# Patient Record
Sex: Female | Born: 1935 | Race: Black or African American | Hispanic: No | Smoking: Former smoker
Health system: Southern US, Community
[De-identification: ages and names within clinical notes are randomized; demographics above are authoritative.]

## PROBLEM LIST (undated history)

## (undated) DIAGNOSIS — F419 Anxiety disorder, unspecified: Secondary | ICD-10-CM

## (undated) DIAGNOSIS — N343 Urethral syndrome, unspecified: Secondary | ICD-10-CM

## (undated) DIAGNOSIS — C679 Malignant neoplasm of bladder, unspecified: Secondary | ICD-10-CM

## (undated) DIAGNOSIS — E119 Type 2 diabetes mellitus without complications: Secondary | ICD-10-CM

## (undated) DIAGNOSIS — I10 Essential (primary) hypertension: Secondary | ICD-10-CM

## (undated) DIAGNOSIS — E039 Hypothyroidism, unspecified: Secondary | ICD-10-CM

## (undated) DIAGNOSIS — E669 Obesity, unspecified: Secondary | ICD-10-CM

## (undated) DIAGNOSIS — R519 Headache, unspecified: Secondary | ICD-10-CM

## (undated) DIAGNOSIS — E785 Hyperlipidemia, unspecified: Secondary | ICD-10-CM

## (undated) HISTORY — DX: Urethral syndrome, unspecified: N34.3

## (undated) HISTORY — PX: ABDOMINAL HYSTERECTOMY: SHX81

## (undated) HISTORY — DX: Malignant neoplasm of bladder, unspecified: C67.9

## (undated) HISTORY — PX: OTHER SURGICAL HISTORY: SHX169

## (undated) HISTORY — DX: Hypothyroidism, unspecified: E03.9

## (undated) HISTORY — DX: Anxiety disorder, unspecified: F41.9

## (undated) HISTORY — PX: DILATION AND CURETTAGE OF UTERUS: SHX78

## (undated) HISTORY — DX: Type 2 diabetes mellitus without complications: E11.9

## (undated) HISTORY — DX: Headache, unspecified: R51.9

## (undated) HISTORY — DX: Obesity, unspecified: E66.9

## (undated) HISTORY — DX: Hyperlipidemia, unspecified: E78.5

## (undated) HISTORY — PX: VESICOVAGINAL FISTULA CLOSURE W/ TAH: SUR271

## (undated) HISTORY — DX: Essential (primary) hypertension: I10

---

## 2004-02-28 ENCOUNTER — Ambulatory Visit: Payer: Self-pay | Admitting: Family Medicine

## 2004-06-11 ENCOUNTER — Ambulatory Visit: Payer: Self-pay | Admitting: Family Medicine

## 2004-07-24 ENCOUNTER — Ambulatory Visit: Payer: Self-pay | Admitting: Family Medicine

## 2004-11-18 ENCOUNTER — Ambulatory Visit: Payer: Self-pay | Admitting: Family Medicine

## 2005-03-20 ENCOUNTER — Ambulatory Visit: Payer: Self-pay | Admitting: Family Medicine

## 2005-07-13 ENCOUNTER — Ambulatory Visit: Payer: Self-pay | Admitting: Family Medicine

## 2005-09-07 ENCOUNTER — Ambulatory Visit: Payer: Self-pay | Admitting: Family Medicine

## 2005-10-15 ENCOUNTER — Ambulatory Visit: Payer: Self-pay | Admitting: Family Medicine

## 2005-11-12 ENCOUNTER — Ambulatory Visit: Payer: Self-pay | Admitting: Family Medicine

## 2005-11-17 ENCOUNTER — Ambulatory Visit: Payer: Self-pay | Admitting: Family Medicine

## 2005-12-23 ENCOUNTER — Ambulatory Visit: Payer: Self-pay | Admitting: Family Medicine

## 2006-03-15 ENCOUNTER — Ambulatory Visit: Payer: Self-pay | Admitting: Family Medicine

## 2006-04-29 ENCOUNTER — Ambulatory Visit: Payer: Self-pay | Admitting: Family Medicine

## 2006-07-02 LAB — HM COLONOSCOPY: HM Colonoscopy: NORMAL

## 2006-07-28 ENCOUNTER — Other Ambulatory Visit: Admission: RE | Admit: 2006-07-28 | Discharge: 2006-07-28 | Payer: Self-pay | Admitting: Family Medicine

## 2006-07-28 ENCOUNTER — Encounter: Payer: Self-pay | Admitting: Family Medicine

## 2006-07-28 ENCOUNTER — Ambulatory Visit: Payer: Self-pay | Admitting: Family Medicine

## 2006-07-28 LAB — CONVERTED CEMR LAB: Pap Smear: NORMAL

## 2006-07-30 ENCOUNTER — Encounter: Payer: Self-pay | Admitting: Family Medicine

## 2006-07-30 LAB — CONVERTED CEMR LAB: Microalb, Ur: 2.75 mg/dL — ABNORMAL HIGH (ref 0.00–1.89)

## 2006-08-05 LAB — HM MAMMOGRAPHY: HM Mammogram: NORMAL

## 2006-10-27 ENCOUNTER — Ambulatory Visit: Payer: Self-pay | Admitting: Family Medicine

## 2006-12-21 ENCOUNTER — Ambulatory Visit: Payer: Self-pay | Admitting: Family Medicine

## 2007-01-05 ENCOUNTER — Ambulatory Visit: Payer: Self-pay | Admitting: Family Medicine

## 2007-01-05 LAB — CONVERTED CEMR LAB
BUN: 22 mg/dL (ref 6–23)
CO2: 23 meq/L (ref 19–32)
Calcium: 8.8 mg/dL (ref 8.4–10.5)
Chloride: 107 meq/L (ref 96–112)
Creatinine, Ser: 1.51 mg/dL — ABNORMAL HIGH (ref 0.40–1.20)
Glucose, Bld: 93 mg/dL (ref 70–99)
Potassium: 4.1 meq/L (ref 3.5–5.3)
Sodium: 140 meq/L (ref 135–145)

## 2007-01-11 ENCOUNTER — Ambulatory Visit: Payer: Self-pay | Admitting: Family Medicine

## 2007-01-12 ENCOUNTER — Encounter: Payer: Self-pay | Admitting: Family Medicine

## 2007-01-12 LAB — CONVERTED CEMR LAB
Candida species: POSITIVE — AB
Chlamydia, DNA Probe: NEGATIVE
GC Probe Amp, Genital: NEGATIVE
Gardnerella vaginalis: POSITIVE — AB
Trichomonal Vaginitis: NEGATIVE

## 2007-04-20 ENCOUNTER — Ambulatory Visit: Payer: Self-pay | Admitting: Family Medicine

## 2007-04-20 LAB — CONVERTED CEMR LAB: Hgb A1c MFr Bld: 5.7 %

## 2007-05-06 ENCOUNTER — Encounter: Payer: Self-pay | Admitting: Family Medicine

## 2007-05-06 DIAGNOSIS — E785 Hyperlipidemia, unspecified: Secondary | ICD-10-CM | POA: Insufficient documentation

## 2007-05-06 DIAGNOSIS — I10 Essential (primary) hypertension: Secondary | ICD-10-CM | POA: Insufficient documentation

## 2007-05-06 DIAGNOSIS — E039 Hypothyroidism, unspecified: Secondary | ICD-10-CM | POA: Insufficient documentation

## 2007-05-06 DIAGNOSIS — E119 Type 2 diabetes mellitus without complications: Secondary | ICD-10-CM | POA: Insufficient documentation

## 2007-07-19 ENCOUNTER — Ambulatory Visit: Payer: Self-pay | Admitting: Family Medicine

## 2007-08-11 DIAGNOSIS — E669 Obesity, unspecified: Secondary | ICD-10-CM | POA: Insufficient documentation

## 2007-08-11 DIAGNOSIS — C679 Malignant neoplasm of bladder, unspecified: Secondary | ICD-10-CM | POA: Insufficient documentation

## 2007-08-12 ENCOUNTER — Encounter: Payer: Self-pay | Admitting: Family Medicine

## 2007-08-12 ENCOUNTER — Ambulatory Visit: Payer: Self-pay | Admitting: Family Medicine

## 2007-08-12 LAB — CONVERTED CEMR LAB: TSH: 5.714 microintl units/mL — ABNORMAL HIGH (ref 0.350–5.50)

## 2007-09-02 ENCOUNTER — Encounter: Payer: Self-pay | Admitting: Family Medicine

## 2007-09-02 LAB — CONVERTED CEMR LAB: Microalb, Ur: 1.03 mg/dL (ref 0.00–1.89)

## 2007-09-19 ENCOUNTER — Encounter: Payer: Self-pay | Admitting: Family Medicine

## 2007-09-19 ENCOUNTER — Ambulatory Visit: Payer: Self-pay | Admitting: Family Medicine

## 2007-09-19 LAB — CONVERTED CEMR LAB
Glucose, Bld: 99 mg/dL
OCCULT 1: NEGATIVE
TSH: 6.27 microintl units/mL — ABNORMAL HIGH (ref 0.350–5.50)

## 2007-12-06 ENCOUNTER — Ambulatory Visit: Payer: Self-pay | Admitting: Family Medicine

## 2007-12-06 LAB — CONVERTED CEMR LAB
Glucose, Bld: 131 mg/dL
Hgb A1c MFr Bld: 6.3 %

## 2008-01-19 ENCOUNTER — Telehealth: Payer: Self-pay | Admitting: Family Medicine

## 2008-01-23 ENCOUNTER — Ambulatory Visit: Payer: Self-pay | Admitting: Family Medicine

## 2008-01-23 DIAGNOSIS — N3000 Acute cystitis without hematuria: Secondary | ICD-10-CM | POA: Insufficient documentation

## 2008-01-23 LAB — CONVERTED CEMR LAB
Bilirubin Urine: NEGATIVE
Blood in Urine, dipstick: NEGATIVE
Glucose, Urine, Semiquant: NEGATIVE
Ketones, urine, test strip: NEGATIVE
Nitrite: POSITIVE
Protein, U semiquant: NEGATIVE
Specific Gravity, Urine: 1.025
Urobilinogen, UA: 0.2
WBC Urine, dipstick: NEGATIVE
pH: 5.5

## 2008-01-27 ENCOUNTER — Encounter: Payer: Self-pay | Admitting: Family Medicine

## 2008-02-15 ENCOUNTER — Encounter: Payer: Self-pay | Admitting: Family Medicine

## 2008-02-23 ENCOUNTER — Encounter: Payer: Self-pay | Admitting: Family Medicine

## 2008-02-27 ENCOUNTER — Encounter: Payer: Self-pay | Admitting: Family Medicine

## 2008-03-07 ENCOUNTER — Ambulatory Visit: Payer: Self-pay | Admitting: Family Medicine

## 2008-03-07 DIAGNOSIS — R5383 Other fatigue: Secondary | ICD-10-CM

## 2008-03-07 DIAGNOSIS — R5381 Other malaise: Secondary | ICD-10-CM | POA: Insufficient documentation

## 2008-03-07 DIAGNOSIS — M26609 Unspecified temporomandibular joint disorder, unspecified side: Secondary | ICD-10-CM | POA: Insufficient documentation

## 2008-03-07 LAB — CONVERTED CEMR LAB
Glucose, Bld: 129 mg/dL
Hgb A1c MFr Bld: 5.8 %

## 2008-03-09 ENCOUNTER — Encounter: Payer: Self-pay | Admitting: Family Medicine

## 2008-03-09 LAB — CONVERTED CEMR LAB
ALT: 13 units/L (ref 0–35)
AST: 15 units/L (ref 0–37)
Albumin: 3.9 g/dL (ref 3.5–5.2)
Alkaline Phosphatase: 36 units/L — ABNORMAL LOW (ref 39–117)
BUN: 27 mg/dL — ABNORMAL HIGH (ref 6–23)
Bilirubin, Direct: 0.1 mg/dL (ref 0.0–0.3)
CO2: 18 meq/L — ABNORMAL LOW (ref 19–32)
Calcium: 8.7 mg/dL (ref 8.4–10.5)
Chloride: 111 meq/L (ref 96–112)
Cholesterol: 149 mg/dL (ref 0–200)
Creatinine, Ser: 1.76 mg/dL — ABNORMAL HIGH (ref 0.40–1.20)
Glucose, Bld: 109 mg/dL — ABNORMAL HIGH (ref 70–99)
HCT: 34.4 % — ABNORMAL LOW (ref 36.0–46.0)
HDL: 48 mg/dL (ref >39)
Hemoglobin: 11.4 g/dL — ABNORMAL LOW (ref 12.0–15.0)
Indirect Bilirubin: 0.3 mg/dL (ref 0.0–0.9)
LDL Cholesterol: 76 mg/dL (ref 0–99)
MCHC: 33.1 g/dL (ref 30.0–36.0)
MCV: 92.7 fL (ref 78.0–100.0)
Platelets: 182 10*3/uL (ref 150–400)
Potassium: 4.5 meq/L (ref 3.5–5.3)
RBC: 3.71 M/uL — ABNORMAL LOW (ref 3.87–5.11)
RDW: 16 % — ABNORMAL HIGH (ref 11.5–15.5)
Retic Ct Pct: 1.7 % (ref 0.4–3.1)
Sodium: 142 meq/L (ref 135–145)
TSH: 3.14 microintl units/mL (ref 0.350–4.50)
Total Bilirubin: 0.4 mg/dL (ref 0.3–1.2)
Total CHOL/HDL Ratio: 3.1
Total Protein: 6.7 g/dL (ref 6.0–8.3)
Triglycerides: 126 mg/dL (ref <150)
VLDL: 25 mg/dL (ref 0–40)
WBC: 3.3 10*3/uL — ABNORMAL LOW (ref 4.0–10.5)

## 2008-03-12 ENCOUNTER — Telehealth: Payer: Self-pay | Admitting: Family Medicine

## 2008-04-18 ENCOUNTER — Ambulatory Visit: Payer: Self-pay | Admitting: Family Medicine

## 2008-04-18 ENCOUNTER — Telehealth (INDEPENDENT_AMBULATORY_CARE_PROVIDER_SITE_OTHER): Payer: Self-pay | Admitting: *Deleted

## 2008-04-18 LAB — CONVERTED CEMR LAB
Bilirubin Urine: NEGATIVE
Blood in Urine, dipstick: NEGATIVE
Glucose, Urine, Semiquant: NEGATIVE
Nitrite: POSITIVE
Protein, U semiquant: NEGATIVE
Specific Gravity, Urine: 1.025
Urobilinogen, UA: 0.2
pH: 5.5

## 2008-04-19 ENCOUNTER — Encounter: Payer: Self-pay | Admitting: Family Medicine

## 2008-04-23 ENCOUNTER — Encounter: Payer: Self-pay | Admitting: Family Medicine

## 2008-05-08 ENCOUNTER — Telehealth: Payer: Self-pay | Admitting: Family Medicine

## 2008-05-10 ENCOUNTER — Ambulatory Visit: Payer: Self-pay | Admitting: Family Medicine

## 2008-05-10 LAB — CONVERTED CEMR LAB
Bilirubin Urine: NEGATIVE
Blood in Urine, dipstick: NEGATIVE
Glucose, Urine, Semiquant: NEGATIVE
Nitrite: POSITIVE
Protein, U semiquant: NEGATIVE
Specific Gravity, Urine: 1.02
Urobilinogen, UA: 0.2
WBC Urine, dipstick: NEGATIVE
pH: 5.5

## 2008-05-11 ENCOUNTER — Encounter: Payer: Self-pay | Admitting: Family Medicine

## 2008-09-07 ENCOUNTER — Ambulatory Visit: Payer: Self-pay | Admitting: Family Medicine

## 2008-09-07 LAB — CONVERTED CEMR LAB
Creatinine, Urine: 36.5 mg/dL
Glucose, Bld: 155 mg/dL
Microalb Creat Ratio: 13.7 mg/g (ref 0.0–30.0)
Microalb, Ur: 0.5 mg/dL (ref 0.00–1.89)

## 2008-09-08 DIAGNOSIS — F339 Major depressive disorder, recurrent, unspecified: Secondary | ICD-10-CM | POA: Insufficient documentation

## 2008-09-08 DIAGNOSIS — F329 Major depressive disorder, single episode, unspecified: Secondary | ICD-10-CM

## 2008-09-10 ENCOUNTER — Encounter: Payer: Self-pay | Admitting: Family Medicine

## 2008-09-11 ENCOUNTER — Encounter: Payer: Self-pay | Admitting: Family Medicine

## 2008-11-23 ENCOUNTER — Ambulatory Visit: Payer: Self-pay | Admitting: Family Medicine

## 2008-11-23 LAB — CONVERTED CEMR LAB
ALT: 16 units/L (ref 0–35)
AST: 18 units/L (ref 0–37)
Albumin: 4 g/dL (ref 3.5–5.2)
Alkaline Phosphatase: 53 units/L (ref 39–117)
BUN: 20 mg/dL (ref 6–23)
Basophils Absolute: 0 10*3/uL (ref 0.0–0.1)
Basophils Relative: 0 % (ref 0–1)
Bilirubin, Direct: 0.1 mg/dL (ref 0.0–0.3)
CO2: 19 meq/L (ref 19–32)
Calcium: 9.2 mg/dL (ref 8.4–10.5)
Chloride: 108 meq/L (ref 96–112)
Cholesterol: 114 mg/dL (ref 0–200)
Creatinine, Ser: 1.24 mg/dL — ABNORMAL HIGH (ref 0.40–1.20)
Eosinophils Absolute: 0.1 10*3/uL (ref 0.0–0.7)
Eosinophils Relative: 2 % (ref 0–5)
Glucose, Bld: 156 mg/dL
Glucose, Bld: 125 mg/dL — ABNORMAL HIGH (ref 70–99)
HCT: 39 % (ref 36.0–46.0)
HDL: 42 mg/dL (ref >39)
Hemoglobin: 12.9 g/dL (ref 12.0–15.0)
Hgb A1c MFr Bld: 6.5 %
Indirect Bilirubin: 0.3 mg/dL (ref 0.0–0.9)
LDL Cholesterol: 54 mg/dL (ref 0–99)
Lymphocytes Relative: 26 % (ref 12–46)
Lymphs Abs: 1 10*3/uL (ref 0.7–4.0)
MCHC: 33.1 g/dL (ref 30.0–36.0)
MCV: 92.9 fL (ref 78.0–100.0)
Monocytes Absolute: 0.3 10*3/uL (ref 0.1–1.0)
Monocytes Relative: 9 % (ref 3–12)
Neutro Abs: 2.4 10*3/uL (ref 1.7–7.7)
Neutrophils Relative %: 63 % (ref 43–77)
Platelets: 181 10*3/uL (ref 150–400)
Potassium: 4.4 meq/L (ref 3.5–5.3)
RBC: 4.2 M/uL (ref 3.87–5.11)
RDW: 15.2 % (ref 11.5–15.5)
Sodium: 141 meq/L (ref 135–145)
TSH: 4.409 microintl units/mL (ref 0.350–4.500)
Total Bilirubin: 0.4 mg/dL (ref 0.3–1.2)
Total CHOL/HDL Ratio: 2.7
Total Protein: 6.9 g/dL (ref 6.0–8.3)
Triglycerides: 89 mg/dL (ref <150)
VLDL: 18 mg/dL (ref 0–40)
WBC: 3.8 10*3/uL — ABNORMAL LOW (ref 4.0–10.5)

## 2008-12-03 ENCOUNTER — Encounter: Payer: Self-pay | Admitting: Family Medicine

## 2008-12-05 ENCOUNTER — Encounter: Payer: Self-pay | Admitting: Family Medicine

## 2008-12-10 ENCOUNTER — Telehealth: Payer: Self-pay | Admitting: Family Medicine

## 2008-12-27 ENCOUNTER — Encounter: Payer: Self-pay | Admitting: Family Medicine

## 2009-01-22 ENCOUNTER — Telehealth: Payer: Self-pay | Admitting: Family Medicine

## 2009-02-07 ENCOUNTER — Telehealth: Payer: Self-pay | Admitting: Family Medicine

## 2009-05-22 ENCOUNTER — Encounter: Payer: Self-pay | Admitting: Family Medicine

## 2009-06-19 ENCOUNTER — Encounter: Payer: Self-pay | Admitting: Family Medicine

## 2009-06-26 ENCOUNTER — Ambulatory Visit: Payer: Self-pay | Admitting: Family Medicine

## 2009-06-26 LAB — CONVERTED CEMR LAB
Bilirubin Urine: NEGATIVE
Blood Glucose, Fasting: 173 mg/dL
Blood in Urine, dipstick: NEGATIVE
Glucose, Urine, Semiquant: NEGATIVE
Ketones, urine, test strip: NEGATIVE
Nitrite: NEGATIVE
Protein, U semiquant: NEGATIVE
Specific Gravity, Urine: 1.025
Urobilinogen, UA: 0.2
WBC Urine, dipstick: NEGATIVE
pH: 5

## 2009-07-02 ENCOUNTER — Encounter: Payer: Self-pay | Admitting: Family Medicine

## 2009-07-03 LAB — CONVERTED CEMR LAB
ALT: 22 units/L (ref 0–35)
AST: 20 units/L (ref 0–37)
Albumin: 3.8 g/dL (ref 3.5–5.2)
Alkaline Phosphatase: 54 units/L (ref 39–117)
BUN: 19 mg/dL (ref 6–23)
Bilirubin, Direct: 0.1 mg/dL (ref 0.0–0.3)
CO2: 23 meq/L (ref 19–32)
Calcium: 9 mg/dL (ref 8.4–10.5)
Chloride: 109 meq/L (ref 96–112)
Cholesterol: 121 mg/dL (ref 0–200)
Creatinine, Ser: 1.28 mg/dL — ABNORMAL HIGH (ref 0.40–1.20)
Glucose, Bld: 136 mg/dL — ABNORMAL HIGH (ref 70–99)
HDL: 50 mg/dL (ref >39)
Hgb A1c MFr Bld: 6.8 % — ABNORMAL HIGH (ref 4.6–6.1)
Indirect Bilirubin: 0.2 mg/dL (ref 0.0–0.9)
LDL Cholesterol: 55 mg/dL (ref 0–99)
Potassium: 4.3 meq/L (ref 3.5–5.3)
Sodium: 142 meq/L (ref 135–145)
TSH: 3.799 microintl units/mL (ref 0.350–4.500)
Total Bilirubin: 0.3 mg/dL (ref 0.3–1.2)
Total CHOL/HDL Ratio: 2.4
Total Protein: 6.5 g/dL (ref 6.0–8.3)
Triglycerides: 82 mg/dL (ref <150)
VLDL: 16 mg/dL (ref 0–40)
Vit D, 25-Hydroxy: 29 ng/mL — ABNORMAL LOW (ref 30–89)

## 2009-07-04 ENCOUNTER — Telehealth: Payer: Self-pay | Admitting: Family Medicine

## 2009-07-05 ENCOUNTER — Encounter: Payer: Self-pay | Admitting: Family Medicine

## 2009-07-08 ENCOUNTER — Encounter: Payer: Self-pay | Admitting: Family Medicine

## 2009-07-13 LAB — CONVERTED CEMR LAB: Retic Ct Pct: 1.1 % (ref 0.4–3.1)

## 2009-10-02 ENCOUNTER — Ambulatory Visit: Payer: Self-pay | Admitting: Family Medicine

## 2009-10-02 LAB — HM DIABETES FOOT EXAM

## 2009-10-02 LAB — CONVERTED CEMR LAB: Hgb A1c MFr Bld: 6.5 % — ABNORMAL HIGH (ref <5.7)

## 2009-10-03 ENCOUNTER — Encounter: Payer: Self-pay | Admitting: Family Medicine

## 2009-10-03 LAB — CONVERTED CEMR LAB
Creatinine, Urine: 171 mg/dL
Microalb Creat Ratio: 2.9 mg/g (ref 0.0–30.0)
Microalb, Ur: 0.5 mg/dL (ref 0.00–1.89)

## 2010-02-20 ENCOUNTER — Telehealth: Payer: Self-pay | Admitting: Family Medicine

## 2010-02-21 ENCOUNTER — Ambulatory Visit: Payer: Self-pay | Admitting: Family Medicine

## 2010-02-21 DIAGNOSIS — M79609 Pain in unspecified limb: Secondary | ICD-10-CM | POA: Insufficient documentation

## 2010-02-26 LAB — CONVERTED CEMR LAB
ALT: 16 units/L (ref 0–35)
AST: 18 units/L (ref 0–37)
Albumin: 4.1 g/dL (ref 3.5–5.2)
Alkaline Phosphatase: 50 units/L (ref 39–117)
Basophils Absolute: 0 10*3/uL (ref 0.0–0.1)
Basophils Relative: 1 % (ref 0–1)
Bilirubin, Direct: 0.1 mg/dL (ref 0.0–0.3)
Cholesterol: 121 mg/dL (ref 0–200)
Eosinophils Absolute: 0.1 10*3/uL (ref 0.0–0.7)
Eosinophils Relative: 2 % (ref 0–5)
HCT: 38.2 % (ref 36.0–46.0)
HDL: 44 mg/dL (ref >39)
Hemoglobin: 12.9 g/dL (ref 12.0–15.0)
Hgb A1c MFr Bld: 6.6 % — ABNORMAL HIGH (ref <5.7)
Indirect Bilirubin: 0.4 mg/dL (ref 0.0–0.9)
LDL Cholesterol: 62 mg/dL (ref 0–99)
Lymphocytes Relative: 26 % (ref 12–46)
Lymphs Abs: 1.1 10*3/uL (ref 0.7–4.0)
MCHC: 33.8 g/dL (ref 30.0–36.0)
MCV: 90.7 fL (ref 78.0–100.0)
Monocytes Absolute: 0.5 10*3/uL (ref 0.1–1.0)
Monocytes Relative: 13 % — ABNORMAL HIGH (ref 3–12)
Neutro Abs: 2.4 10*3/uL (ref 1.7–7.7)
Neutrophils Relative %: 59 % (ref 43–77)
Platelets: 182 10*3/uL (ref 150–400)
RBC: 4.21 M/uL (ref 3.87–5.11)
RDW: 15.3 % (ref 11.5–15.5)
TSH: 4.777 microintl units/mL — ABNORMAL HIGH (ref 0.350–4.500)
Total Bilirubin: 0.5 mg/dL (ref 0.3–1.2)
Total CHOL/HDL Ratio: 2.8
Total Protein: 7 g/dL (ref 6.0–8.3)
Triglycerides: 75 mg/dL (ref <150)
VLDL: 15 mg/dL (ref 0–40)
WBC: 4.1 10*3/uL (ref 4.0–10.5)

## 2010-03-11 ENCOUNTER — Telehealth (INDEPENDENT_AMBULATORY_CARE_PROVIDER_SITE_OTHER): Payer: Self-pay | Admitting: *Deleted

## 2010-05-07 ENCOUNTER — Ambulatory Visit
Admission: RE | Admit: 2010-05-07 | Discharge: 2010-05-07 | Payer: Self-pay | Source: Home / Self Care | Attending: Orthopedic Surgery | Admitting: Orthopedic Surgery

## 2010-05-07 ENCOUNTER — Encounter: Payer: Self-pay | Admitting: Orthopedic Surgery

## 2010-05-07 DIAGNOSIS — M5430 Sciatica, unspecified side: Secondary | ICD-10-CM | POA: Insufficient documentation

## 2010-05-11 ENCOUNTER — Encounter: Payer: Self-pay | Admitting: Family Medicine

## 2010-05-12 ENCOUNTER — Encounter: Payer: Self-pay | Admitting: Family Medicine

## 2010-05-20 NOTE — Assessment & Plan Note (Signed)
Summary: office visit   Vital Signs:  Patient Profile:   77 Years Old Female Height:     65.5 inches Weight:      220.3 pounds BMI:     36.23 Pulse rate:   100 / minute Resp:     16 per minute BP sitting:   120 / 70  (left arm)  Pt. in pain?   no  Vitals Entered By: Calvert Cantor (December 06, 2007 8:28 AM)                  Chief Complaint:  pt here for check up.  History of Present Illness: No complaints. ne o recent fever or chills.  Linda Richardson continues to live independently and is well able to care for her self. She does try to be careful in her diet and keeps active.  She denies polyuria, poyldypsia or hypoglycemic episodes. Her fasting blood sugars are seldom over 110 when checked. She denies depression, anxiety or insomnia.    Prior Medications Reviewed Using: Medication Bottles  Updated Prior Medication List: METOPROLOL SUCCINATE 50 MG  TB24 (METOPROLOL SUCCINATE) Take 1/2 tablet by mouth two times a day AVALIDE 150-12.5 MG  TABS (IRBESARTAN-HYDROCHLOROTHIAZIDE) Take 1 tablet by mouth two times a day SIMVASTATIN 80 MG  TABS (SIMVASTATIN) Take 1 tab by mouth at bedtime CLONIDINE HCL 0.2 MG  TABS (CLONIDINE HCL) Take 1 tablet by mouth at bedtime IBUPROFEN 800 MG  TABS (IBUPROFEN) Take 1 tablet by mouth three times a day ALPHAGAN P 0.1 %  SOLN (BRIMONIDINE TARTRATE) One drop each eye twice daily as needed AVANDIA 4 MG  TABS (ROSIGLITAZONE MALEATE) Take one tab by mouth once daily FEXOFENADINE HCL 180 MG  TABS (FEXOFENADINE HCL) Take 1 tablet by mouth once a day CITALOPRAM HYDROBROMIDE 20 MG  TABS (CITALOPRAM HYDROBROMIDE) Take 1 tablet by mouth once a day CLOTRIMAZOLE-BETAMETHASONE 1-0.05 %  CREA (CLOTRIMAZOLE-BETAMETHASONE) apply once daily as needed CETIRIZINE HCL 10 MG  TABS (CETIRIZINE HCL) one tab by mouth once daily SYNTHROID 25 MCG  TABS (LEVOTHYROXINE SODIUM) 1.5 tab once daily [BMN] * DIABETIC SHOES WITH INSERTS X 1PR.   Current Allergies: ! PCN !  SULFA  Past Surgical History:    Hysterectomy    Surgery on bladder for cancer    D and C x 3   Family History:        MOTHER DECEASED STROKE    FATHER DECEASED KILLED    ONE SISTER LIVING HEALTH UNKNOWN  Social History:    WIDOW    Former Smoker    Alcohol use-no    Drug use-no    three adult children    Review of Systems  ENT      Denies ear discharge, sinus pressure, and sore throat.  CV      Denies chest pain or discomfort, palpitations, and swelling of feet.  Resp      Denies cough, shortness of breath, sputum productive, and wheezing.  GI      Denies abdominal pain, constipation, diarrhea, nausea, and vomiting.  GU      Denies dysuria, incontinence, and urinary frequency.  Linda      Denies joint pain, joint swelling, and stiffness.  Neuro      Complains of headaches, poor balance, and visual disturbances.  Psych      Denies anxiety, depression, suicidal thoughts/plans, and thoughts of violence.  Endo      Denies excessive thirst and polyuria.  Allergy  Complains of seasonal allergies.      Denies hives or rash, itching eyes, and sneezing.   Physical Exam  General:     overweight-appearing.   Head:     Normocephalic and atraumatic without obvious abnormalities. No apparent alopecia or balding. Eyes:     vision grossly intact and pupils equal.   Ears:     External ear exam shows no significant lesions or deformities.  Otoscopic examination reveals clear canals, tympanic membranes are intact bilaterally without bulging, retraction, inflammation or discharge. Hearing is grossly normal bilaterally. Nose:     no external deformity, no external erythema, and no nasal discharge.   Mouth:     pharynx pink and moist and fair dentition.   Neck:     No deformities, masses, or tenderness noted. Lungs:     Normal respiratory effort, chest expands symmetrically. Lungs are clear to auscultation, no crackles or wheezes. Heart:     Normal rate and  regular rhythm. S1 and S2 normal without gallop, murmur, click, rub or other extra sounds. Abdomen:     soft, non-tender, and normal bowel sounds.   Extremities:     No clubbing, cyanosis, edema, or deformity noted with normal full range of motion of all joints.   Neurologic:     alert & oriented X3, cranial nerves II-XII intact, strength normal in all extremities, and sensation intact to light touch.   Skin:     Intact without suspicious lesions or rashes Psych:     good eye contact, not anxious appearing, and not depressed appearing.      Impression & Recommendations:  Problem # 1:  BLADDER CANCER (ICD-188.9) Assessment: Comment Only Cured. Pt. followed by urology.  Problem # 2:  OBESITY (ICD-278.00) Assessment: Unchanged  Problem # 3:  ANXIETY (ICD-300.00) Assessment: Improved  Her updated medication list for this problem includes:    Citalopram Hydrobromide 20 Mg Tabs (Citalopram hydrobromide) .Marland Kitchen... Take 1 tablet by mouth once a day   Problem # 4:  HYPOTHYROIDISM (ICD-244.9) Assessment: Comment Only  Her updated medication list for this problem includes:    Synthroid 25 Mcg Tabs (Levothyroxine sodium) .Marland Kitchen... 1.5 tab once daily  Orders: T-TSH ([id number]) Awaiting current TSH result for med adjustment  Labs Reviewed: TSH: 6.270 (09/19/2007)    HgBA1c: 6.3 (12/06/2007) Chol: 127 (08/12/2007)   HDL: 41 (08/12/2007)   LDL: 53 (08/12/2007)   TG: 163 (08/12/2007)   Problem # 5:  DIABETES MELLITUS, TYPE II (ICD-250.00) Assessment: Unchanged  Her updated medication list for this problem includes:    Avalide 150-12.5 Mg Tabs (Irbesartan-hydrochlorothiazide) .Marland Kitchen... Take 1 tablet by mouth two times a day    Avandia 4 Mg Tabs (Rosiglitazone maleate) .Marland Kitchen... Take one tab by mouth once daily  Labs Reviewed: HgBA1c: 6.3 (12/06/2007)   Creat: 1.36 (08/12/2007)   Microalbumin: 1.03 (09/02/2007)   Problem # 6:  HYPERTENSION (ICD-401.9) Assessment: Unchanged  Her updated  medication list for this problem includes:    Metoprolol Succinate 50 Mg Tb24 (Metoprolol succinate) .Marland Kitchen... Take 1/2 tablet by mouth two times a day    Avalide 150-12.5 Mg Tabs (Irbesartan-hydrochlorothiazide) .Marland Kitchen... Take 1 tablet by mouth two times a day    Clonidine Hcl 0.2 Mg Tabs (Clonidine hcl) .Marland Kitchen... Take 1 tablet by mouth at bedtime  Orders: T-Basic Metabolic Panel [id number])   Complete Medication List: 1)  Metoprolol Succinate 50 Mg Tb24 (Metoprolol succinate) .... Take 1/2 tablet by mouth two times a day 2)  Avalide 150-12.5 Mg Tabs (Irbesartan-hydrochlorothiazide) .Marland KitchenMarland KitchenMarland Kitchen  Take 1 tablet by mouth two times a day 3)  Simvastatin 80 Mg Tabs (Simvastatin) .... Take 1 tab by mouth at bedtime 4)  Clonidine Hcl 0.2 Mg Tabs (Clonidine hcl) .... Take 1 tablet by mouth at bedtime 5)  Ibuprofen 800 Mg Tabs (Ibuprofen) .... Take 1 tablet by mouth three times a day 6)  Alphagan P 0.1 % Soln (Brimonidine tartrate) .... One drop each eye twice daily as needed 7)  Avandia 4 Mg Tabs (Rosiglitazone maleate) .... Take one tab by mouth once daily 8)  Fexofenadine Hcl 180 Mg Tabs (Fexofenadine hcl) .... Take 1 tablet by mouth once a day 9)  Citalopram Hydrobromide 20 Mg Tabs (Citalopram hydrobromide) .... Take 1 tablet by mouth once a day 10)  Clotrimazole-betamethasone 1-0.05 % Crea (Clotrimazole-betamethasone) .... Apply once daily as needed 11)  Cetirizine Hcl 10 Mg Tabs (Cetirizine hcl) .... One tab by mouth once daily 12)  Synthroid 25 Mcg Tabs (Levothyroxine sodium) .... 1.5 tab once daily 13)  Diabetic Shoes With Inserts X 1pr.  14)  Onetouch Lancets Misc (Lancets) .... Once daily testing 15)  Onetouch Test Strp (Glucose blood) .... Once daily testing  Other Orders: T-Hepatic Function [id number]) T-Lipid Profile [id number])   Patient Instructions: 1)  Please schedule a follow-up appointment in 3 months. 2)  It is important that you exercise regularly at least 20 minutes 5 times a week.  If you develop chest pain, have severe difficulty breathing, or feel very tired , stop exercising immediately and seek medical attention. 3)  You need to lose weight. Consider a lower calorie diet and regular exercise.  4)  Check your Blood Pressure regularly. If it is above150/95  you should make an appointment. 5)  Check your blood sugars regularly. If your readings are usually above250: or below 70 you should contact our office. 6)  BMP prior to visit, ICD-9: 7)  Hepatic Panel prior to visit, ICD-9: 8)  Lipid Panel prior to visit, ICD-9:   Prescriptions: ONETOUCH TEST   STRP (GLUCOSE BLOOD) once daily testing  #3 mnth x 3   Entered by:   Worthy Keeler LPN   Authorized by:   Syliva Overman MD   Signed by:   Worthy Keeler LPN on 29/56/2130   Method used:   Handwritten   RxID:   [id number] ONETOUCH LANCETS   MISC (LANCETS) once daily testing  #3 months x 3   Entered by:   Worthy Keeler LPN   Authorized by:   Syliva Overman MD   Signed by:   Worthy Keeler LPN on 32/44/0102   Method used:   Handwritten   RxID:   [id number]  ] Laboratory Results   Blood Tests   Date/Time Received: December 06, 2007 8:36 AM  Date/Time Reported: December 06, 2007 8:36 AM   Glucose (random): 131 mg/dL   (Normal Range: 56-387) HGBA1C: 6.3%   (Normal Range: Non-Diabetic - 3-6%   Control Diabetic - 6-8%)

## 2010-05-20 NOTE — Assessment & Plan Note (Signed)
Summary: office visit   Vital Signs:  Patient profile:   77 year old female Menstrual status:  hysterectomy Height:      65.5 inches Weight:      217 pounds BMI:     35.69 O2 Sat:      93 % Pulse rate:   71 / minute Pulse rhythm:   regular Resp:     16 per minute BP sitting:   120 / 72  (left arm) Cuff size:   large  Vitals Entered By: Everitt Amber (November 23, 2008 8:47 AM)  Nutrition Counseling: Patient's BMI is greater than 25 and therefore counseled on weight management options. CC: Follow up chronic problems Is Patient Diabetic? Yes   CC:  Follow up chronic problems.  History of Present Illness: Patient reports doing well.  Denies any recent fever or chills.  Denies any appetite change or change in bowel movements. Patient denies depression, anxiety or insomnia. She denies any head or chest congestion, dysuria or frequency. She does have mild joint pain and stiffness, but has no h/o falling. She reports regular bowel movements.  Linda Richardson states that she rides her bike for 1 hour daily  Allergies: 1)  ! Pcn 2)  ! Sulfa  Review of Systems      See HPI General:  Denies chills and fever. Eyes:  Denies blurring and double vision. ENT:  Denies earache, hoarseness, nasal congestion, sinus pressure, and sore throat. CV:  Denies chest pain or discomfort, palpitations, and swelling of feet. Resp:  Denies cough and sputum productive. GI:  Denies abdominal pain, bloody stools, constipation, diarrhea, nausea, and vomiting. GU:  See HPI; Denies incontinence. MS:  Complains of joint pain and stiffness; denies loss of strength, low back pain, and mid back pain; not disabling in any way. Derm:  Denies itching, lesion(s), and rash. Neuro:  Denies falling down, headaches, and memory loss. Psych:  Complains of anxiety and depression; denies easily angered, easily tearful, irritability, suicidal thoughts/plans, thoughts of violence, and unusual visions or sounds; improved on  medication and with time, she is leaving family problems in God's hands. Endo:  Denies cold intolerance, excessive hunger, excessive thirst, excessive urination, heat intolerance, polyuria, and weight change; tests on avg once weekly. Heme:  Denies abnormal bruising and bleeding. Allergy:  Denies hives or rash and itching eyes.  Physical Exam  General:  alert, well-hydrated, and overweight-appearing. HEENT: No facial asymmetry,  EOMI, No sinus tenderness, TM's Clear, oropharynx  pink and moist.   Chest: Clear to auscultation bilaterally.  CVS: S1, S2, No murmurs, No S3.   Abd: Soft, Nontender.  MS: Adequate ROM spine, hips, shoulders and knees.  Ext: No edema.   CNS: CN 2-12 intact, power tone and sensation normal throughout.   Skin: Intact, no visible lesions or rashes.  Psych: Good eye contact, normal affect.  Memory intact, not anxious or depressed appearing.     Diabetes Management Exam:    Foot Exam (with socks and/or shoes not present):       Sensory-Monofilament:          Left foot: normal          Right foot: normal       Inspection:          Left foot: normal          Right foot: normal       Nails:          Left foot: thickened  Right foot: thickened   Impression & Recommendations:  Problem # 1:  DEPRESSION (ICD-311) Assessment Improved  Her updated medication list for this problem includes:    Fluoxetine Hcl 10 Mg Tabs (Fluoxetine hcl) .Marland Kitchen... Take 1 tablet by mouth once a day  Problem # 2:  OBESITY (ICD-278.00) Assessment: Unchanged  Ht: 65.5 (11/23/2008)   Wt: 217 (11/23/2008)   BMI: 35.69 (11/23/2008)  Problem # 3:  HYPERTENSION (ICD-401.9) Assessment: Unchanged  Her updated medication list for this problem includes:    Metoprolol Succinate 50 Mg Tb24 (Metoprolol succinate) .Marland Kitchen... Take 1/2 tablet by mouth two times a day    Avalide 150-12.5 Mg Tabs (Irbesartan-hydrochlorothiazide) .Marland Kitchen... Take 1 tablet by mouth two times a day    Clonidine Hcl 0.2  Mg Tabs (Clonidine hcl) .Marland Kitchen... Take 1 tablet by mouth at bedtime  Orders: T-Basic Metabolic Panel [id number])  BP today: 120/72 Prior BP: 120/62 (09/07/2008)  Labs Reviewed: K+: 4.5 (03/07/2008) Creat: : 1.76 (03/07/2008)   Chol: 149 (03/07/2008)   HDL: 48 (03/07/2008)   LDL: 76 (03/07/2008)   TG: 126 (03/07/2008)  Problem # 4:  HYPERLIPIDEMIA (ICD-272.4) Assessment: Comment Only  Her updated medication list for this problem includes:    Simvastatin 80 Mg Tabs (Simvastatin) .Marland Kitchen... Take 1 tab by mouth at bedtime  Orders: T-Lipid Profile [id number]) T-Hepatic Function [id number])  Labs Reviewed: SGOT: 15 (03/07/2008)   SGPT: 13 (03/07/2008)   HDL:48 (03/07/2008), 41 (08/12/2007)  LDL:76 (03/07/2008), 53 (08/12/2007)  Chol:149 (03/07/2008), 127 (08/12/2007)  Trig:126 (03/07/2008), 163 (08/12/2007)  Problem # 5:  DIABETES MELLITUS, TYPE II (ICD-250.00) Assessment: Deteriorated  Her updated medication list for this problem includes:    Avalide 150-12.5 Mg Tabs (Irbesartan-hydrochlorothiazide) .Marland Kitchen... Take 1 tablet by mouth two times a day    Avandia 4 Mg Tabs (Rosiglitazone maleate) .Marland Kitchen... Take one tab by mouth once daily  Orders: Glucose, (CBG) ([id number]) Hemoglobin A1C (83036)  Labs Reviewed: Creat: 1.76 (03/07/2008)    Reviewed HgBA1c results: 6.5 (11/23/2008)  6.3 (09/10/2008)  Complete Medication List: 1)  Metoprolol Succinate 50 Mg Tb24 (Metoprolol succinate) .... Take 1/2 tablet by mouth two times a day 2)  Avalide 150-12.5 Mg Tabs (Irbesartan-hydrochlorothiazide) .... Take 1 tablet by mouth two times a day 3)  Simvastatin 80 Mg Tabs (Simvastatin) .... Take 1 tab by mouth at bedtime 4)  Clonidine Hcl 0.2 Mg Tabs (Clonidine hcl) .... Take 1 tablet by mouth at bedtime 5)  Alphagan P 0.1 % Soln (Brimonidine tartrate) .... One drop each eye twice daily as needed 6)  Avandia 4 Mg Tabs (Rosiglitazone maleate) .... Take one tab by mouth once daily 7)   Clotrimazole-betamethasone 1-0.05 % Crea (Clotrimazole-betamethasone) .... Apply once daily as needed 8)  Synthroid 25 Mcg Tabs (Levothyroxine sodium) .... 1.5 tab once daily 9)  Onetouch Lancets Misc (Lancets) .... Once daily testing 10)  Onetouch Test Strp (Glucose blood) .... Once daily testing 11)  Phenazopyridine Hcl 100 Mg Tabs (Phenazopyridine hcl) .... Take one tab every 6 hours prn 12)  Fluoxetine Hcl 10 Mg Tabs (Fluoxetine hcl) .... Take 1 tablet by mouth once a day  Other Orders: Radiology Referral (Radiology) Radiology Referral (Radiology) T-CBC w/Diff [id number]) T-TSH [id number])  Patient Instructions: 1)  Please schedule a CPE in 3 months. 2)  It is important that you exercise regularly at least 60 minutes 6 times a week. If you develop chest pain, have severe difficulty breathing, or feel very tired , stop exercising immediately and seek medical attention. 3)  You need  to lose weight. Consider a lower calorie diet and regular exercise.  4)  BMP prior to visit, ICD-9: 5)  Hepatic Panel prior to visit, ICD-9:  fasting labs  today 6)  Lipid Panel prior to visit, ICD-9: 7)  TSH prior to visit, ICD-9: 8)  CBC w/ Diff prior to visit, ICD-9: 9)  We will  reschedule your mamogram today.  Laboratory Results   Blood Tests   Date/Time Received: November 23, 2008  Date/Time Reported: November 23, 2008   Glucose (random): 156 mg/dL   (Normal Range: 02-725) HGBA1C: 6.5%   (Normal Range: Non-Diabetic - 3-6%   Control Diabetic - 6-8%)

## 2010-05-20 NOTE — Progress Notes (Signed)
Summary: [facility]  [facility]   Imported By: Lind Guest 05/24/2009 09:09:38  _____________________________________________________________________  External Attachment:    Type:   Image     Comment:   External Document

## 2010-05-20 NOTE — Progress Notes (Signed)
Summary: hip and arms in pain  Phone Note Call from Patient   Summary of Call: is having pain in hip and arms  where she has arth. her arms and hip is hurting  has done tried advil and tyl. and mot working needs something for pain  call back at [phone] [facility] in [place] Initial call taken by: Lind Guest,  February 07, 2009 2:21 PM  Follow-up for Phone Call        advise and erx tramadol 50 mg Take 1 tablet by mouth two times a day as needed #30 only tell her i hope this helps, youncankl also offer toradol 60mg  and depomedrol 80 mg iM tomorrow nurse visit only Follow-up by: Syliva Overman MD,  February 07, 2009 4:37 PM  Additional Follow-up for Phone Call Additional follow up Details #1::        declined shots, states she will try meds Additional Follow-up by: Worthy Keeler LPN,  February 07, 2009 4:58 PM    New/Updated Medications: TRAMADOL HCL 50 MG TABS (TRAMADOL HCL) one tab by mouth bid Prescriptions: TRAMADOL HCL 50 MG TABS (TRAMADOL HCL) one tab by mouth bid  #30 x 0   Entered by:   Worthy Keeler LPN   Authorized by:   Syliva Overman MD   Signed by:   Worthy Keeler LPN on 65/78/4696   Method used:   Electronically to        [facility] # [phone]* (retail)       [address]       [place], [place]  [address]       Ph: [phone] or [phone]       Fax: [phone]   RxID:   [id number]

## 2010-05-20 NOTE — Progress Notes (Signed)
Summary: FFET AND JAW  Phone Note Call from Patient   Summary of Call: WENT TO ER YESTERDAY  ABOUT HER JAW AND MEANT TO TALK TO YOU ABOUT HER FEET WHEN HERE  THEY SWELL EVERY MORNING AFTER WALKING AROUND   Initial call taken by: Lind Guest,  March 12, 2008 11:10 AM  Follow-up for Phone Call        Returned call, no answer Follow-up by: Everitt Amber,  March 12, 2008 11:38 AM  Additional Follow-up for Phone Call Additional follow up Details #1::        Her feet have been swelling. When she wakes up, they are not swollen, but after walking around some her ankles get swollen. 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 Er gave her Naproxen 500mg . It still hurts some and looks swollen. She just wanted you to know.  Additional Follow-up by: Everitt Amber,  March 13, 2008 1:00 PM    Additional Follow-up for Phone Call Additional follow up Details #2::    advise since swell after walking, elevate legs more and wear support hose, the treatment from the ED is similar to what I gave her and the diagnosis is the same, give the med time if no better like I advised before , she can see ENT, I will refer her, burt she needs to give her med TIME Follow-up by: Syliva Overman MD,  March 13, 2008 4:23 PM  Additional Follow-up for Phone Call Additional follow up Details #3:: Details for Additional Follow-up Action Taken: patient aware Additional Follow-up by: Worthy Keeler LPN,  March 14, 2008 1:45 PM

## 2010-05-20 NOTE — Medication Information (Signed)
Summary: [employer] [facility]   Imported By: Lind Guest 02/23/2008 13:50:31  _____________________________________________________________________  External Attachment:    Type:   Image     Comment:   External Document

## 2010-05-20 NOTE — Progress Notes (Signed)
  Phone Note Call from Patient      

## 2010-05-20 NOTE — Assessment & Plan Note (Signed)
Summary: office visit   Vital Signs:  Patient Profile:   77 Years Old Female Height:     65.5 inches (166.37 cm) Weight:      224.38 pounds (101.99 kg) BMI:     36.90 BSA:     2.09 O2 Sat:      96 % O2 treatment:    Room Air Pulse rate:   70 / minute Pulse rhythm:   regular Resp:     16 per minute BP sitting:   130 / 80  (left arm)  Pt. in pain?   yes    Location:   left jaw    Intensity:   10    Type:       aching  Vitals Entered By: Everitt Amber (March 07, 2008 8:03 AM)                  Chief Complaint:  Follow up, left jaw pain and under left ear, and face felt swollen.  History of Present Illness: Two week h/o left jaw which has worsened to the extent that she could not sing this past Sunday. she has had no recent fevr or chillss. She is eatine excessively and has been gradually gaing weight. She has no exercise routine. the pt denies symptoms of uncontrolled depression or anxiety. She also denies symptoms of uncontrolled blood sugars.    Updated Prior Medication List: METOPROLOL SUCCINATE 50 MG  TB24 (METOPROLOL SUCCINATE) Take 1/2 tablet by mouth two times a day AVALIDE 150-12.5 MG  TABS (IRBESARTAN-HYDROCHLOROTHIAZIDE) Take 1 tablet by mouth two times a day SIMVASTATIN 80 MG  TABS (SIMVASTATIN) Take 1 tab by mouth at bedtime CLONIDINE HCL 0.2 MG  TABS (CLONIDINE HCL) Take 1 tablet by mouth at bedtime IBUPROFEN 800 MG  TABS (IBUPROFEN) Take 1 tablet by mouth three times a day ALPHAGAN P 0.1 %  SOLN (BRIMONIDINE TARTRATE) One drop each eye twice daily as needed AVANDIA 4 MG  TABS (ROSIGLITAZONE MALEATE) Take one tab by mouth once daily FEXOFENADINE HCL 180 MG  TABS (FEXOFENADINE HCL) Take 1 tablet by mouth once a day CLOTRIMAZOLE-BETAMETHASONE 1-0.05 %  CREA (CLOTRIMAZOLE-BETAMETHASONE) apply once daily as needed SYNTHROID 25 MCG  TABS (LEVOTHYROXINE SODIUM) 1.5 tab once daily [BMN] * DIABETIC SHOES WITH INSERTS X 1PR.  ONETOUCH LANCETS   MISC (LANCETS) once  daily testing ONETOUCH TEST   STRP (GLUCOSE BLOOD) once daily testing PHENAZOPYRIDINE HCL 100 MG TABS (PHENAZOPYRIDINE HCL) Take one tab every 6 hours prn  Current Allergies: ! PCN ! SULFA  Past Medical History:    BLADDER CANCER (ICD-188.9)    OBESITY (ICD-278.00)    * DYSURIA    ANXIETY (ICD-300.00)    HYPOTHYROIDISM (ICD-244.9)    DIABETES MELLITUS, TYPE II (ICD-250.00)    HYPERTENSION (ICD-401.9)    HYPERLIPIDEMIA (ICD-272.4)    GLAUCOMA  2006              Past Surgical History:    Hysterectomy    Surgery on bladder for cancer    D and C x 3    BILATERAL CATARACT SURGERY  approx 2004   Social History:    WIDOW    Former Smoker    Alcohol use-no    Drug use-no    three adult children, one son killed by Company secretary  at age 48    Review of Systems  General      Denies chills, fatigue, fever, loss of appetite, malaise, sleep disorder, sweats, weakness, and weight loss.  ENT      Complains of earache.      Denies hoarseness, nasal congestion, sinus pressure, and sore throat.  CV      Denies chest pain or discomfort, palpitations, shortness of breath with exertion, and swelling of feet.  Resp      Denies cough and sputum productive.  GI      Denies abdominal pain, constipation, diarrhea, nausea, and vomiting.  GU      Denies dysuria and urinary frequency.  MS      Complains of joint pain and stiffness.  Psych      Complains of anxiety.      Denies easily tearful, irritability, suicidal thoughts/plans, thoughts of violence, and unusual visions or sounds.  Endo      Denies cold intolerance, excessive hunger, excessive thirst, excessive urination, heat intolerance, polyuria, and weight change.  Heme      Denies abnormal bruising, bleeding, enlarge lymph nodes, fevers, pallor, and skin discoloration.  Allergy      Denies hives or rash, itching eyes, persistent infections, seasonal allergies, and sneezing.   Physical Exam  General:      overweight-appearing.   Head:     Normocephalic and atraumatic without obvious abnormalities. No apparent alopecia or balding. Eyes:     vision grossly intact and pupils equal.   Ears:     External ear exam shows no significant lesions or deformities.  Otoscopic examination reveals clear canals, tympanic membranes are intact bilaterally without bulging, retraction, inflammation or discharge. Hearing is grossly normal bilaterally. Nose:     no external deformity and no nasal discharge.   Mouth:     pharynx pink and moist, fair dentition, and teeth missing.   Neck:     No deformities, masses, or tenderness noted. Lungs:     Normal respiratory effort, chest expands symmetrically. Lungs are clear to auscultation, no crackles or wheezes. Heart:     Normal rate and regular rhythm. S1 and S2 normal without gallop, murmur, click, rub or other extra sounds. Abdomen:     soft, non-tender, and normal bowel sounds.   Msk:     decreased ROM spine, hips and knees. tender over left TMJ Extremities:     No clubbing, cyanosis, edema, or deformity noted with normal full range of motion of all joints.   Neurologic:     alert & oriented X3, cranial nerves II-XII intact, strength normal in all extremities, and sensation intact to light touch.   Skin:     Intact without suspicious lesions or rashes Cervical Nodes:     No lymphadenopathy noted Psych:     Cognition and judgment appear intact. Alert and cooperative with normal attention span and concentration. No apparent delusions, illusions, hallucinations    Impression & Recommendations:  Problem # 1:  TMJ SYNDROME (ICD-524.60) Assessment: Comment Only anti-inflammatories and warm compresses, if symptoms worsen ENT eval, no chewing gum.  Problem # 2:  OBESITY (ICD-278.00) Assessment: Deteriorated  Problem # 3:  HYPOTHYROIDISM (ICD-244.9) Assessment: Comment Only  Her updated medication list for this problem includes:    Synthroid 25 Mcg Tabs  (Levothyroxine sodium) .Marland Kitchen... 1.5 tab once daily  Orders: T-TSH [phone])  Labs Reviewed: TSH: 6.270 (09/19/2007)    HgBA1c: 5.8 (03/07/2008) Chol: 127 (08/12/2007)   HDL: 41 (08/12/2007)   LDL: 53 (08/12/2007)   TG: 163 (08/12/2007)   Problem # 4:  DIABETES MELLITUS, TYPE II (ICD-250.00) Assessment: Improved  Her updated medication list for this problem includes:  Avalide 150-12.5 Mg Tabs (Irbesartan-hydrochlorothiazide) .Marland Kitchen... Take 1 tablet by mouth two times a day    Avandia 4 Mg Tabs (Rosiglitazone maleate) .Marland Kitchen... Take one tab by mouth once daily  Orders: Glucose, (CBG) (82962) Hemoglobin A1C (83036)  Labs Reviewed: HgBA1c: 5.8 (03/07/2008)   Creat: 1.36 (08/12/2007)   Microalbumin: 1.03 (09/02/2007)   Problem # 5:  HYPERLIPIDEMIA (ICD-272.4) Assessment: Comment Only  Her updated medication list for this problem includes:    Simvastatin 80 Mg Tabs (Simvastatin) .Marland Kitchen... Take 1 tab by mouth at bedtime  Orders: T-Lipid Profile [phone]) T-Hepatic Function [id number])  Labs Reviewed: Chol: 127 (08/12/2007)   HDL: 41 (08/12/2007)   LDL: 53 (08/12/2007)   TG: 163 (08/12/2007) SGOT: 17 (08/12/2007)   SGPT: 17 (08/12/2007)   Complete Medication List: 1)  Metoprolol Succinate 50 Mg Tb24 (Metoprolol succinate) .... Take 1/2 tablet by mouth two times a day 2)  Avalide 150-12.5 Mg Tabs (Irbesartan-hydrochlorothiazide) .... Take 1 tablet by mouth two times a day 3)  Simvastatin 80 Mg Tabs (Simvastatin) .... Take 1 tab by mouth at bedtime 4)  Clonidine Hcl 0.2 Mg Tabs (Clonidine hcl) .... Take 1 tablet by mouth at bedtime 5)  Ibuprofen 800 Mg Tabs (Ibuprofen) .... Take 1 tablet by mouth three times a day 6)  Alphagan P 0.1 % Soln (Brimonidine tartrate) .... One drop each eye twice daily as needed 7)  Avandia 4 Mg Tabs (Rosiglitazone maleate) .... Take one tab by mouth once daily 8)  Fexofenadine Hcl 180 Mg Tabs (Fexofenadine hcl) .... Take 1 tablet by mouth once a  day 9)  Clotrimazole-betamethasone 1-0.05 % Crea (Clotrimazole-betamethasone) .... Apply once daily as needed 10)  Synthroid 25 Mcg Tabs (Levothyroxine sodium) .... 1.5 tab once daily 11)  Diabetic Shoes With Inserts X 1pr.  12)  Onetouch Lancets Misc (Lancets) .... Once daily testing 13)  Onetouch Test Strp (Glucose blood) .... Once daily testing 14)  Phenazopyridine Hcl 100 Mg Tabs (Phenazopyridine hcl) .... Take one tab every 6 hours prn 15)  Ibu 800 Mg Tabs (Ibuprofen) .... Take 1 tablet by mouth three times a day  Other Orders: T-CBC No Diff ([id number]) T-Basic Metabolic Panel ([id number]) Radiology Referral (Radiology)   Patient Instructions: 1)  Please schedule a follow-up appointment in 3 months. 2)  It is important that you exercise regularly at least 20 minutes 5 times a week. If you develop chest pain, have severe difficulty breathing, or feel very tired , stop exercising immediately and seek medical attention. 3)  You need to lose weight. Consider a lower calorie diet and regular exercise.  4)  Your blood pressure and blood sugar are great, no med changes. 5)  You have arthritis in your left jaw causing pain ,please stop chewing gum and we will treat you with anti-inflammatories. Call back if your symptoms are worse for referral ro ENT.   Prescriptions: IBU 800 MG TABS (IBUPROFEN) Take 1 tablet by mouth three times a day  #30 x 0   Entered and Authorized by:   Syliva Overman MD   Signed by:   Syliva Overman MD on 03/07/2008   Method used:   Electronically to        [facility] # [phone]* (retail)       [address]       [place], [place]  [address]       Ph: [phone] or [phone]       Fax: [phone]   RxID:  [id number]  ]   Laboratory Results   Blood Tests   Date/Time Received: March 07, 2008 8am Date/Time Reported: March 07, 2008 8am  Glucose (random): 129 mg/dL   (Normal Range: 57-846) HGBA1C: 5.8%   (Normal  Range: Non-Diabetic - 3-6%   Control Diabetic - 6-8%)

## 2010-05-20 NOTE — Progress Notes (Signed)
Summary: speak with nurse  Phone Note Call from Patient   Summary of Call: needs to speak with nurse about meds. [phone] Initial call taken by: Rudene Anda,  July 04, 2009 10:36 AM  Follow-up for Phone Call        patient states she has no energy, states she needs somthing to give her some get up and go, already takes a multivitamin, what can she take? Follow-up by: Adella Hare LPN,  July 04, 2009 10:51 AM  Additional Follow-up for Phone Call Additional follow up Details #1::        added on an anemia panel #3 per dr Lodema Hong. the cbc wasn't able to be added Additional Follow-up by: Everitt Amber LPN,  July 05, 2009 8:46 AM

## 2010-05-20 NOTE — Progress Notes (Signed)
Summary: speak with nurse  Phone Note Call from Patient   Summary of Call: needs to speak with nurse. [phone] Initial call taken by: Rudene Anda,  May 08, 2008 2:09 PM  Follow-up for Phone Call        patient complains of still having burning with urination, wants to know if she can try nitrofurantoin? Follow-up by: Worthy Keeler LPN,  May 08, 2008 2:14 PM  Additional Follow-up for Phone Call Additional follow up Details #1::        advise resubmit urine c/S and erx nitrofurantoin 100mg  Take 1 tablet by mouth two times a day #14 only AFTER her urine goes for C/S only pls Additional Follow-up by: Syliva Overman MD,  May 08, 2008 2:55 PM    Additional Follow-up for Phone Call Additional follow up Details #2::    patient aware Follow-up by: Worthy Keeler LPN,  May 08, 2008 2:59 PM  Additional Follow-up for Phone Call Additional follow up Details #3:: Details for Additional Follow-up Action Taken: patient came in for specimen, was positive, [facility] and rx sent to pharm. Additional Follow-up by: Worthy Keeler LPN,  May 10, 2008 1:57 PM  New/Updated Medications: NITROFURANTOIN MACROCRYSTAL 100 MG CAPS (NITROFURANTOIN MACROCRYSTAL) one tab by mouth bid   Prescriptions: NITROFURANTOIN MACROCRYSTAL 100 MG CAPS (NITROFURANTOIN MACROCRYSTAL) one tab by mouth bid  #14 x 0   Entered by:   Worthy Keeler LPN   Authorized by:   Syliva Overman MD   Signed by:   Worthy Keeler LPN on 45/40/9811   Method used:   Electronically to        [facility]* (retail)       [address]       [place], [place]  [address]       Ph: [phone]       Fax: [phone]   RxID:   [id number]

## 2010-05-20 NOTE — Assessment & Plan Note (Signed)
   Vital Signs:  Patient Profile:   77 Years Old Female Height:     64 inches Weight:      220.01 pounds BMI:     37.90 Temp:     100.1 degrees F oral Pulse rate:   64 / minute Resp:     16 per minute BP sitting:   110 / 64  (right arm)  Vitals Entered By: Chipper Herb (August 12, 2007 8:51 AM)                 Chief Complaint:  check up/ cough ribs sore and conjestion.    Current Allergies: ! PCN ! SULFA        Complete Medication List: 1)  Metoprolol Succinate 50 Mg Tb24 (Metoprolol succinate) .... Take 1/2 tablet by mouth every day 2)  Avalide 150-12.5 Mg Tabs (Irbesartan-hydrochlorothiazide) .... Take 1 tablet by mouth two times a day 3)  Simvastatin 80 Mg Tabs (Simvastatin) .... Take 1 tab by mouth at bedtime 4)  Clonidine Hcl 0.2 Mg Tabs (Clonidine hcl) .... Take 1 tablet by mouth once a day 5)  Ibuprofen 800 Mg Tabs (Ibuprofen) .... Take 1 tablet by mouth three times a day 6)  Phenazopyridine Hcl 100 Mg Tabs (Phenazopyridine hcl) .... Take one tablet by mouth every six hours as needed 7)  Levothyroxine Sodium 25 Mcg Tabs (Levothyroxine sodium) .... Take 1 tablet by mouth once a day 8)  Nitrofurantoin Macrocrystal 100 Mg Caps (Nitrofurantoin macrocrystal) .... Take 1 tablet by mouth two times a day as needed 9)  Alphagan P 0.1 % Soln (Brimonidine tartrate) .... One drop each eye twice daily as needed 10)  Avandia 4 Mg Tabs (Rosiglitazone maleate) .... Take one half tablet by mouth once daily 11)  Fexofenadine Hcl 180 Mg Tabs (Fexofenadine hcl) .... Take 1 tablet by mouth once a day 12)  Citalopram Hydrobromide 20 Mg Tabs (Citalopram hydrobromide) .... Take 1 tablet by mouth once a day 13)  Clotrimazole-betamethasone 1-0.05 % Crea (Clotrimazole-betamethasone) .... Apply once daily as needed   see paper chart  ]

## 2010-05-20 NOTE — Miscellaneous (Signed)
Summary: refill  Clinical Lists Changes  Medications: Rx of AVALIDE 150-12.5 MG  TABS (IRBESARTAN-HYDROCHLOROTHIAZIDE) Take 1 tablet by mouth two times a day;  #60 x 3;  Signed;  Entered by: Everitt Amber LPN;  Authorized by: Syliva Overman MD;  Method used: Electronically to [facility]*, [address], [place], [place], [place]  [address], Ph: [phone], Fax: [phone] Rx of SIMVASTATIN 80 MG  TABS (SIMVASTATIN) Take 1 tab by mouth at bedtime;  #30 x 3;  Signed;  Entered by: Everitt Amber LPN;  Authorized by: Syliva Overman MD;  Method used: Electronically to [facility]*, [address] [place], [place], [place]  [address], Ph: [phone], Fax: [phone] Rx of METOPROLOL SUCCINATE 50 MG  TB24 (METOPROLOL SUCCINATE) Take 1/2 tablet by mouth two times a day;  #30 x 3;  Signed;  Entered by: Everitt Amber LPN;  Authorized by: Syliva Overman MD;  Method used: Electronically to [facility]*, [address], [place], [place], [place]  [address], Ph: [phone], Fax: [phone] Rx of CLONIDINE HCL 0.2 MG  TABS (CLONIDINE HCL) Take 1 tablet by mouth at bedtime;  #30 x 3;  Signed;  Entered by: Everitt Amber LPN;  Authorized by: Syliva Overman MD;  Method used: Electronically to [facility]*, [address], [place], [place], [place]  [address], Ph: [phone], Fax: [phone] Rx of SYNTHROID 25 MCG  TABS (LEVOTHYROXINE SODIUM) 1.5 tab once daily;  #45 x 3 Brand medically necessary;  Signed;  Entered by: Everitt Amber LPN;  Authorized by: Syliva Overman MD;  Method used: Electronically to [facility]*, [address], [place], [place], [place]  [address], Ph: [phone], Fax: [phone] Rx of [facility] LANCETS   MISC (LANCETS) once daily testing;  #49month x 3;  Signed;  Entered by: Everitt Amber LPN;  Authorized by: Syliva Overman MD;  Method used: Electronically to [facility]*, [address], [place], [place], [place]  [address], Ph: [phone], Fax: [phone] Rx of [facility] TEST   STRP (GLUCOSE BLOOD) once daily testing;  #35month x 3;  Signed;  Entered by: Everitt Amber LPN;  Authorized by: Syliva Overman MD;  Method used: Electronically to [facility]*, [address], [place], [place], [place]  [address], Ph: [phone], Fax: [phone] Rx of CITALOPRAM HYDROBROMIDE 20 MG TABS (CITALOPRAM HYDROBROMIDE) one tab by mouth qd;  #30 x 3;  Signed;  Entered by: Everitt Amber LPN;  Authorized by: Syliva Overman MD;  Method used: Electronically to [facility]*, [address] [place], [place], [place]  [address], Ph: [phone], Fax: [phone]    Prescriptions: CITALOPRAM HYDROBROMIDE 20 MG TABS (CITALOPRAM HYDROBROMIDE) one tab by mouth qd  #30 x 3   Entered by:   Everitt Amber LPN   Authorized by:   Syliva Overman MD   Signed by:   Everitt Amber LPN on 08/67/6195   Method used:   Electronically to        [facility]* (retail)       [address]       [place], [place]  [address]       Ph: [phone]       Fax: [phone]   RxID:   [id number] [facility] TEST   STRP (GLUCOSE BLOOD) once daily testing  #65month x 3   Entered by:   Everitt Amber LPN  Authorized by:   Syliva Overman MD   Signed by:   Everitt Amber LPN on 91/47/8295   Method used:   Electronically to        [facility]* (retail)       [address]       [place], [place]  [address]       Ph: [phone]       Fax: [phone]   RxID:   [id number] [facility] LANCETS   MISC (LANCETS) once daily testing  #51month x 3   Entered by:   Everitt Amber LPN   Authorized by:   Syliva Overman MD   Signed by:   Everitt Amber LPN on 42/59/5638   Method used:   Electronically to        [facility]* (retail)       [address]       [place], [place]  [address]       Ph: [phone]       Fax: [phone]    RxID:   [id number] SYNTHROID 25 MCG  TABS (LEVOTHYROXINE SODIUM) 1.5 tab once daily Brand medically necessary #45 x 3   Entered by:   Everitt Amber LPN   Authorized by:   Syliva Overman MD   Signed by:   Everitt Amber LPN on 10/11/7626   Method used:   Electronically to        [facility]* (retail)       [address]       [place], [place]  [address]       Ph: [phone]       Fax: [phone]   RxID:   [id number] CLONIDINE HCL 0.2 MG  TABS (CLONIDINE HCL) Take 1 tablet by mouth at bedtime  #30 x 3   Entered by:   Everitt Amber LPN   Authorized by:   Syliva Overman MD   Signed by:   Everitt Amber LPN on 69/67/8938   Method used:   Electronically to        [facility]* (retail)       [address]       [place], [place]  [address]       Ph: [phone]       Fax: [phone]   RxID:   [id number] METOPROLOL SUCCINATE 50 MG  TB24 (METOPROLOL SUCCINATE) Take 1/2 tablet by mouth two times a day  #30 x 3   Entered by:   Everitt Amber LPN   Authorized by:   Syliva Overman MD   Signed by:   Everitt Amber LPN on 26/71/2458   Method used:   Electronically to        [facility]* (retail)       [address]       [place], [place]  [address]       Ph: [phone]       Fax: [phone]   RxID:   [id number] SIMVASTATIN 80 MG  TABS (SIMVASTATIN) Take 1 tab by mouth at bedtime  #30 x 3   Entered by:   Everitt Amber LPN   Authorized by:   Syliva Overman MD   Signed by:   Everitt Amber LPN on  07/08/2009   Method used:   Electronically to        [facility]* (retail)       [address]       [place], [place]  [address]       Ph: [phone]       Fax: [phone]   RxID:   [id number] AVALIDE 150-12.5 MG  TABS (IRBESARTAN-HYDROCHLOROTHIAZIDE) Take 1 tablet by mouth two times a day  #60 x 3   Entered by:   Everitt Amber LPN   Authorized by:    Syliva Overman MD   Signed by:   Everitt Amber LPN on 84/13/2440   Method used:   Electronically to        [facility]* (retail)       [address]       [place], [place]  [address]       Ph: [phone]       Fax: [phone]   RxID:   [id number]

## 2010-05-20 NOTE — Miscellaneous (Signed)
Summary: Refill  Clinical Lists Changes  Medications: Rx of AVALIDE 150-12.5 MG  TABS (IRBESARTAN-HYDROCHLOROTHIAZIDE) Take 1 tablet by mouth two times a day;  #60 x 3;  Signed;  Entered by: Everitt Amber;  Authorized by: Syliva Overman MD;  Method used: Electronically to [facility] # [phone]*, [address], [place], [place]  [address], Ph: [phone] or [phone], Fax: [phone]    Prescriptions: AVALIDE 150-12.5 MG  TABS (IRBESARTAN-HYDROCHLOROTHIAZIDE) Take 1 tablet by mouth two times a day  #60 x 3   Entered by:   Everitt Amber   Authorized by:   Syliva Overman MD   Signed by:   Everitt Amber on 02/15/2008   Method used:   Electronically to        [facility] # [phone]* (retail)       [address]       [place], [place]  [address]       Ph: [phone] or [phone]       Fax: [phone]   RxID:   [id number]

## 2010-05-20 NOTE — Progress Notes (Signed)
Summary: NREVE PILL  Phone Note Call from Patient   Summary of Call: THE LAST NERVE PILL THAT SHE GOT MAKES HER JITTERY AND WOULD LIKE ANOTHER KIND PLEASE SEND TO [facility] [place] CALL TO LET HER KNOW  Initial call taken by: Lind Guest,  January 22, 2009 2:23 PM  Follow-up for Phone Call        advise and d/c fluooxetine and resume citalopram like she had before at the same dose 10mg  1 daily x 1 month refill 2 Follow-up by: Syliva Overman MD,  January 22, 2009 4:50 PM  Additional Follow-up for Phone Call Additional follow up Details #1::        rx sent, patient aware Additional Follow-up by: Worthy Keeler LPN,  January 22, 2009 4:53 PM    New/Updated Medications: CITALOPRAM HYDROBROMIDE 20 MG TABS (CITALOPRAM HYDROBROMIDE) one tab by mouth qd Prescriptions: CITALOPRAM HYDROBROMIDE 20 MG TABS (CITALOPRAM HYDROBROMIDE) one tab by mouth qd  #30 x 2   Entered by:   Worthy Keeler LPN   Authorized by:   Syliva Overman MD   Signed by:   Worthy Keeler LPN on 04/54/0981   Method used:   Electronically to        [facility] # [phone]* (retail)       [address]       [place], [place]  [address]       Ph: [phone] or [phone]       Fax: [phone]   RxID:   [id number]

## 2010-05-20 NOTE — Progress Notes (Signed)
Summary: LEFT MESSAGE  Phone Note Call from Patient   Summary of Call: LEFT MESSAGE WANTS YOU TO CALL HER AT [phone] Initial call taken by: Lind Guest,  February 20, 2010 8:50 AM  Follow-up for Phone Call        egg sized knot came up on her left foot this morning, two little knots above ankle, do not hurt, just came out of no where right hand is swollen, no injury to it Follow-up by: Adella Hare LPN,  February 20, 2010 9:01 AM  Additional Follow-up for Phone Call Additional follow up Details #1::        advised pt to come as a work in around8:30 topo 9 in the morning pls put her on the schedule, she knows. Also i am asking nursing to fax over a lab sheet to getlabs drawn before we see her it will not be up front but already in the lab in case she comes asking for it , thanks  Additional Follow-up by: Syliva Overman MD,  February 20, 2010 12:26 PM    Additional Follow-up for Phone Call Additional follow up Details #2::    coming in the morning Follow-up by: Lind Guest,  February 20, 2010 1:20 PM

## 2010-05-20 NOTE — Assessment & Plan Note (Signed)
Summary: OV   Vital Signs:  Patient profile:   77 year old female Menstrual status:  hysterectomy Height:      65.5 inches Weight:      221.75 pounds O2 Sat:      98 % Pulse rate:   60 / minute Pulse rhythm:   regular Resp:     16 per minute BP sitting:   120 / 70  (left arm) Cuff size:   large  Vitals Entered By: Everitt Amber LPN (June 27, 3242 9:35 AM) CC: Follow up chronic problems Is Patient Diabetic? Yes   CC:  Follow up chronic problems.  History of Present Illness: Reports  that she has been  doing well. Denies recent fever or chills. Denies sinus pressure, nasal congestion , ear pain or sore throat. Denies chest congestion, or cough productive of sputum. Denies chest pain, palpitations, PND, orthopnea or leg swelling. Denies abdominal pain, nausea, vomitting, diarrhea or constipation. Denies change in bowel movements or bloody stool. Denies dysuria , frequency, incontinence or hesitancy. Denies  joint pain, swelling, or reduced mobility. Denies headaches, vertigo, seizures. Denies depression, anxiety or insomnia. Denies  rash, lesions, or itch.     Current Medications (verified): 1)  Metoprolol Succinate 50 Mg  Tb24 (Metoprolol Succinate) .... Take 1/2 Tablet By Mouth Two Times A Day 2)  Avalide 150-12.5 Mg  Tabs (Irbesartan-Hydrochlorothiazide) .... Take 1 Tablet By Mouth Two Times A Day 3)  Simvastatin 80 Mg  Tabs (Simvastatin) .... Take 1 Tab By Mouth At Bedtime 4)  Clonidine Hcl 0.2 Mg  Tabs (Clonidine Hcl) .... Take 1 Tablet By Mouth At Bedtime 5)  Alphagan P 0.1 %  Soln (Brimonidine Tartrate) .... One Drop Each Eye Twice Daily As Needed 6)  Synthroid 25 Mcg  Tabs (Levothyroxine Sodium) .... 1.5 Tab Once Daily 7)  Onetouch Lancets   Misc (Lancets) .... Once Daily Testing 8)  Onetouch Test   Strp (Glucose Blood) .... Once Daily Testing 9)  Citalopram Hydrobromide 20 Mg Tabs (Citalopram Hydrobromide) .... One Tab By Mouth Qd 10)  Tramadol Hcl 50 Mg Tabs  (Tramadol Hcl) .... One Tab By Mouth Bid  Allergies (verified): 1)  ! Pcn 2)  ! Sulfa  Review of Systems Eyes:  Denies blurring and discharge. GU:  Complains of dysuria and urinary frequency; 3 day history. MS:  Complains of joint pain; pt has fallen twice  since her last visit, and she has also had achilees tendonitis. Endo:  Denies cold intolerance, excessive hunger, excessive thirst, excessive urination, heat intolerance, polyuria, and weight change; not testing regularly. Heme:  Denies abnormal bruising and bleeding. Allergy:  Complains of seasonal allergies.  Physical Exam  General:  Well-developed,well-nourished,in no acute distress; alert,appropriate and cooperative throughout examination HEENT: No facial asymmetry,  EOMI, No sinus tenderness, TM's Clear, oropharynx  pink and moist.   Chest: Clear to auscultation bilaterally.  CVS: S1, S2, No murmurs, No S3.   Abd: Soft, Nontender.  MS: Adequate ROM spine, hips, shoulders and knees.  Ext: No edema.   CNS: CN 2-12 intact, power tone and sensation normal throughout.   Skin: Intact, no visible lesions or rashes.  Psych: Good eye contact, normal affect.  Memory intact, not anxious or depressed appearing.   Diabetes Management Exam:    Foot Exam (with socks and/or shoes not present):       Sensory-Monofilament:          Left foot: normal  Right foot: normal       Inspection:          Left foot: normal          Right foot: normal       Nails:          Left foot: thickened          Right foot: thickened   Impression & Recommendations:  Problem # 1:  ACUTE CYSTITIS (ICD-595.0) Assessment Comment Only  Orders: UA Dipstick W/ Micro (manual) (32951)  Encouraged to push clear liquids, get enough rest, and take acetaminophen as needed. To be seen in 10 days if no improvement, sooner if worse.  Problem # 2:  DEPRESSION (ICD-311) Assessment: Improved  The following medications were removed from the medication  list:    Fluoxetine Hcl 10 Mg Tabs (Fluoxetine hcl) .Marland Kitchen... Take 1 tablet by mouth once a day Her updated medication list for this problem includes:    Citalopram Hydrobromide 20 Mg Tabs (Citalopram hydrobromide) ..... One tab by mouth qd  Problem # 3:  HYPOTHYROIDISM (ICD-244.9) Assessment: Comment Only  Her updated medication list for this problem includes:    Synthroid 25 Mcg Tabs (Levothyroxine sodium) .Marland Kitchen... 1.5 tab once daily  Labs Reviewed: TSH: 4.409 (11/23/2008)    HgBA1c: 6.5 (11/23/2008) Chol: 114 (11/23/2008)   HDL: 42 (11/23/2008)   LDL: 54 (11/23/2008)   TG: 89 (11/23/2008)  Problem # 4:  DIABETES MELLITUS, TYPE II (ICD-250.00) Assessment: Comment Only  The following medications were removed from the medication list:    Avandia 4 Mg Tabs (Rosiglitazone maleate) .Marland Kitchen... Take one tab by mouth once daily Her updated medication list for this problem includes:    Avalide 150-12.5 Mg Tabs (Irbesartan-hydrochlorothiazide) .Marland Kitchen... Take 1 tablet by mouth two times a day  Orders: Glucose, (CBG) (82962) T- Hemoglobin A1C ([id number])  Labs Reviewed: Creat: 1.24 (11/23/2008)    Reviewed HgBA1c results: 6.5 (11/23/2008)  6.3 (09/10/2008)  Complete Medication List: 1)  Metoprolol Succinate 50 Mg Tb24 (Metoprolol succinate) .... Take 1/2 tablet by mouth two times a day 2)  Avalide 150-12.5 Mg Tabs (Irbesartan-hydrochlorothiazide) .... Take 1 tablet by mouth two times a day 3)  Simvastatin 80 Mg Tabs (Simvastatin) .... Take 1 tab by mouth at bedtime 4)  Clonidine Hcl 0.2 Mg Tabs (Clonidine hcl) .... Take 1 tablet by mouth at bedtime 5)  Alphagan P 0.1 % Soln (Brimonidine tartrate) .... One drop each eye twice daily as needed 6)  Synthroid 25 Mcg Tabs (Levothyroxine sodium) .... 1.5 tab once daily 7)  Onetouch Lancets Misc (Lancets) .... Once daily testing 8)  Onetouch Test Strp (Glucose blood) .... Once daily testing 9)  Citalopram Hydrobromide 20 Mg Tabs (Citalopram hydrobromide)  .... One tab by mouth qd 10)  Tramadol Hcl 50 Mg Tabs (Tramadol hcl) .... One tab by mouth bid  Other Orders: T-Basic Metabolic Panel [phone]) T-Hepatic Function [id number]) T-Lipid Profile [phone]) T-TSH [phone]) T-Vitamin D (25-Hydroxy) [id number])  Patient Instructions: 1)  Please schedule a follow-up appointment in 3 months. 2)  It is important that you exercise regularly at least 20 minutes 5 times a week. If you develop chest pain, have severe difficulty breathing, or feel very tired , stop exercising immediately and seek medical attention. 3)  You need to lose weight. Consider a lower calorie diet and regular exercise.  4)  BMP prior to visit, ICD-9: 5)  Hepatic Panel prior to visit, ICD-9: 6)  Lipid Panel prior to visit, ICD-9:  fasting  labs today 7)  TSH prior to visit, ICD-9: 8)  HbgA1C prior to visit, ICD-9: 9)  vitamin D  Prescriptions: CLONIDINE HCL 0.2 MG  TABS (CLONIDINE HCL) Take 1 tablet by mouth at bedtime  #90 Tablet x 4   Entered by:   Everitt Amber LPN   Authorized by:   Syliva Overman MD   Signed by:   Everitt Amber LPN on 16/01/9603   Method used:   Electronically to        [facility] # [phone]* (retail)       [address]       [place], [place]  [address]       Ph: [phone] or [phone]       Fax: [phone]   RxID:   [id number] SIMVASTATIN 80 MG  TABS (SIMVASTATIN) Take 1 tab by mouth at bedtime  #90 Tablet x 4   Entered by:   Everitt Amber LPN   Authorized by:   Syliva Overman MD   Signed by:   Everitt Amber LPN on 74/25/9563   Method used:   Electronically to        [facility] # [id number]* (retail)       [address]       [place], [place]  [address]       Ph: [phone] or [phone]       Fax: [phone]   RxID:   [id number] AVALIDE 150-12.5 MG  TABS (IRBESARTAN-HYDROCHLOROTHIAZIDE) Take 1 tablet by mouth two times a day  #180 x 4   Entered by:   Everitt Amber LPN   Authorized by:    Syliva Overman MD   Signed by:   Everitt Amber LPN on 61/60/7371   Method used:   Electronically to        [facility] # [phone]* (retail)       [address]       [place], [place]  [address]       Ph: [phone] or [phone]       Fax: [phone]   RxID:   [id number]   Laboratory Results   Urine Tests    Routine Urinalysis   Color: yellow Appearance: Clear Glucose: negative   (Normal Range: Negative) Bilirubin: negative   (Normal Range: Negative) Ketone: negative   (Normal Range: Negative) Spec. Gravity: 1.025   (Normal Range: 1.003-1.035) Blood: negative   (Normal Range: Negative) pH: 5.0   (Normal Range: 5.0-8.0) Protein: negative   (Normal Range: Negative) Urobilinogen: 0.2   (Normal Range: 0-1) Nitrite: negative   (Normal Range: Negative) Leukocyte Esterace: negative   (Normal Range: Negative)     Blood Tests     Glucose (fasting): 173 mg/dL   (Normal Range: 53-614)

## 2010-05-20 NOTE — Assessment & Plan Note (Signed)
Summary: OV   Vital Signs:  Patient profile:   77 year old female Menstrual status:  hysterectomy Height:      65.5 inches Weight:      218.75 pounds BMI:     35.98 O2 Sat:      99 % on Room air Pulse rate:   85 / minute Pulse rhythm:   regular Resp:     16 per minute BP sitting:   120 / 66  (left arm)  Vitals Entered By: Adella Hare LPN (February 21, 2010 8:50 AM)  O2 Flow:  Room air CC: foot and hand swelling (knots) Is Patient Diabetic? Yes Did you bring your meter with you today? No Pain Assessment Patient in pain? no        CC:  foot and hand swelling (knots).  History of Present Illness: Pt reports 4 days ago she developed tender swelling on right hand no trauma, reduced motion of right index, better since using ice. Also noted swelling of left foot Reports  that prior to thi she had been doing fairly well Denies recent fever or chills. Denies sinus pressure, nasal congestion , ear pain or sore throat. Denies chest congestion, or cough productive of sputum. Denies chest pain, palpitations, PND, orthopnea or leg swelling. Denies abdominal pain, nausea, vomitting, diarrhea or constipation. Denies change in bowel movements or bloody stool. Denies dysuria , frequency, incontinence or hesitancy.  Denies headaches, vertigo, seizures. Denies depression, anxiety or insomnia. she had recent stress with ill health of her son who is now much improved. Denies  rash, lesions, or itch.     Current Medications (verified): 1)  Metoprolol Succinate 50 Mg  Tb24 (Metoprolol Succinate) .... Take 1/2 Tablet By Mouth Once Daily 2)  Avalide 150-12.5 Mg  Tabs (Irbesartan-Hydrochlorothiazide) .... Take 1 Tablet By Mouth Two Times A Day 3)  Simvastatin 80 Mg  Tabs (Simvastatin) .... Take 1 Tab By Mouth At Bedtime 4)  Clonidine Hcl 0.2 Mg  Tabs (Clonidine Hcl) .... Take 1 Tablet By Mouth At Bedtime 5)  Alphagan P 0.1 %  Soln (Brimonidine Tartrate) .... One Drop Each Eye Twice Daily  As Needed 6)  Onetouch Lancets   Misc (Lancets) .... Once Daily Testing 7)  Onetouch Test   Strp (Glucose Blood) .... Once Daily Testing 8)  Citalopram Hydrobromide 20 Mg Tabs (Citalopram Hydrobromide) .... One Tab By Mouth Once Daily  Allergies (verified): 1)  ! Pcn 2)  ! Sulfa  Review of Systems      See HPI General:  Complains of fatigue. Eyes:  Denies blurring, discharge, eye pain, and red eye. MS:  Complains of joint pain and stiffness; acute pain and swelling of hand and foot, use of cold compress has helped alot,no direct trauma to either. Endo:  Denies excessive thirst and excessive urination. Heme:  Denies abnormal bruising and bleeding. Allergy:  Denies hives or rash and itching eyes.   Impression & Recommendations:  Problem # 1:  HAND PAIN, RIGHT (ICD-729.5) Assessment Deteriorated vimovo provided with directions  Problem # 2:  DEPRESSION (ICD-311) Assessment: Improved  Her updated medication list for this problem includes:    Citalopram Hydrobromide 20 Mg Tabs (Citalopram hydrobromide) ..... One tab by mouth once daily  Problem # 3:  OBESITY (ICD-278.00) Assessment: Unchanged  Ht: 65.5 (02/21/2010)   Wt: 218.75 (02/21/2010)   BMI: 35.98 (02/21/2010) therapeutic lifestyle change discussed and encouraged  Problem # 4:  DIABETES MELLITUS, TYPE II (ICD-250.00) Assessment: Comment Only  Her updated  medication list for this problem includes:    Avalide 150-12.5 Mg Tabs (Irbesartan-hydrochlorothiazide) .Marland Kitchen... Take 1 tablet by mouth two times a day Patient advised to reduce carbs and sweets, commit to regular physical activity, take meds as prescribed, test blood sugars as directed, and attempt to lose weight , to improve blood sugar control.  Labs Reviewed: Creat: 1.28 (07/02/2009)    Reviewed HgBA1c results: 6.5 (10/02/2009)  6.8 (07/02/2009)  Problem # 5:  HYPERTENSION (ICD-401.9) Assessment: Improved  Her updated medication list for this problem includes:     Metoprolol Succinate 50 Mg Tb24 (Metoprolol succinate) .Marland Kitchen... Take 1/2 tablet by mouth once daily    Avalide 150-12.5 Mg Tabs (Irbesartan-hydrochlorothiazide) .Marland Kitchen... Take 1 tablet by mouth two times a day    Clonidine Hcl 0.2 Mg Tabs (Clonidine hcl) .Marland Kitchen... Take 1 tablet by mouth at bedtime  BP today: 120/66 Prior BP: 130/80 (10/02/2009)  Labs Reviewed: K+: 4.3 (07/02/2009) Creat: : 1.28 (07/02/2009)   Chol: 121 (07/02/2009)   HDL: 50 (07/02/2009)   LDL: 55 (07/02/2009)   TG: 82 (07/02/2009)  Problem # 6:  HYPOTHYROIDISM (ICD-244.9) Assessment: Comment Only  The following medications were removed from the medication list:    Synthroid 25 Mcg Tabs (Levothyroxine sodium) .Marland Kitchen... 1.5 tab once daily Her updated medication list for this problem includes:    Synthroid 25 Mcg Tabs (Levothyroxine sodium) .Marland Kitchen... Take 1 tablet by mouth once a day  Labs Reviewed: TSH: 3.799 (07/02/2009)    HgBA1c: 6.5 (10/02/2009) Chol: 121 (07/02/2009)   HDL: 50 (07/02/2009)   LDL: 55 (07/02/2009)   TG: 82 (07/02/2009)  Orders: Medicare Electronic Prescription [phone])  Problem # 7:  HYPERLIPIDEMIA (ICD-272.4) Assessment: Comment Only  The following medications were removed from the medication list:    Simvastatin 80 Mg Tabs (Simvastatin) .Marland Kitchen... Take 1 tab by mouth at bedtime Her updated medication list for this problem includes:    Simvastatin 40 Mg Tabs (Simvastatin) .Marland Kitchen... Take 1 tab by mouth at bedtime Low fat dietdiscussed and encouraged  Labs Reviewed: SGOT: 20 (07/02/2009)   SGPT: 22 (07/02/2009)   HDL:50 (07/02/2009), 42 (11/23/2008)  LDL:55 (07/02/2009), 54 (11/23/2008)  Chol:121 (07/02/2009), 114 (11/23/2008)  Trig:82 (07/02/2009), 89 (11/23/2008)  Orders: Medicare Electronic Prescription [phone])  Complete Medication List: 1)  Metoprolol Succinate 50 Mg Tb24 (Metoprolol succinate) .... Take 1/2 tablet by mouth once daily 2)  Avalide 150-12.5 Mg Tabs (Irbesartan-hydrochlorothiazide) .... Take 1  tablet by mouth two times a day 3)  Clonidine Hcl 0.2 Mg Tabs (Clonidine hcl) .... Take 1 tablet by mouth at bedtime 4)  Alphagan P 0.1 % Soln (Brimonidine tartrate) .... One drop each eye twice daily as needed 5)  Onetouch Lancets Misc (Lancets) .... Once daily testing 6)  Onetouch Test Strp (Glucose blood) .... Once daily testing 7)  Citalopram Hydrobromide 20 Mg Tabs (Citalopram hydrobromide) .... One tab by mouth once daily 8)  Vimovo 500-20 Mg Tbec (Naproxen-esomeprazole) .... Take 1 tablet by mouth two times a day 9)  Synthroid 25 Mcg Tabs (Levothyroxine sodium) .... Take 1 tablet by mouth once a day 10)  Simvastatin 40 Mg Tabs (Simvastatin) .... Take 1 tab by mouth at bedtime  Other Orders: Radiology Referral (Radiology)  Patient Instructions: 1)  Please schedule a follow-up appointment in 4 months. 2)  It is important that you exercise regularly at least 20 minutes 5 times a week. If you develop chest pain, have severe difficulty breathing, or feel very tired , stop exercising immediately and seek medical attention.  3)  You need to lose weight. Consider a lower calorie diet and regular exercise.  4)  You are referred for a mamogram. 5)  You will get samples of VIMOVO , take 1 twwice daily for 6 days for the pain and swelling of the hand  and foot , and use cold compress Prescriptions: SIMVASTATIN 40 MG TABS (SIMVASTATIN) Take 1 tab by mouth at bedtime  #90 x 3   Entered and Authorized by:   Syliva Overman MD   Signed by:   Syliva Overman MD on 02/24/2010   Method used:   Historical   RxID:   [id number] SYNTHROID 25 MCG TABS (LEVOTHYROXINE SODIUM) Take 1 tablet by mouth once a day  #90 x 3   Entered and Authorized by:   Syliva Overman MD   Signed by:   Syliva Overman MD on 02/24/2010   Method used:   Historical   RxID:   [id number] VIMOVO 500-20 MG TBEC (NAPROXEN-ESOMEPRAZOLE) Take 1 tablet by mouth two times a day  #12 x 0   Entered and Authorized by:    Syliva Overman MD   Signed by:   Syliva Overman MD on 02/21/2010   Method used:   Samples Given   RxID:   [id number]    Orders Added: 1)  Est. Patient Level IV [01027] 2)  Radiology Referral [Radiology] 3)  Medicare Electronic Prescription [G8553]    vimovo samples  [id number]  10/12/10 #12

## 2010-05-20 NOTE — Assessment & Plan Note (Signed)
Summary: OV   Vital Signs:  Patient profile:   77 year old female Menstrual status:  hysterectomy Height:      65.5 inches (166.37 cm) Weight:      216.50 pounds (98.41 kg) BMI:     35.61 Pulse rate:   60 / minute Resp:     16 per minute BP sitting:   120 / 62  (left arm)  Vitals Entered By: Worthy Keeler LPN (Sep 07, 2008 9:18 AM)  Nutrition Counseling: Patient's BMI is greater than 25 and therefore counseled on weight management options.  History of Present Illness: Patient reports doing well.  Denies any recent fever or chills.  Denies any appetite change or change in bowel movements. Patient reports increased depression in the last 4 months, thinks she needs a change in her med. She reports crying spells and some social withdrawal, she has poor relations with one of her children, her oldest daughter who is out of touch with her most of the time and this is her grewatest stress. She dnies polyuria, polydypsia,blurred vision or hypoglycemic episodes.  Current Medications (verified): 1)  Metoprolol Succinate 50 Mg  Tb24 (Metoprolol Succinate) .... Take 1/2 Tablet By Mouth Two Times A Day 2)  Avalide 150-12.5 Mg  Tabs (Irbesartan-Hydrochlorothiazide) .... Take 1 Tablet By Mouth Two Times A Day 3)  Simvastatin 80 Mg  Tabs (Simvastatin) .... Take 1 Tab By Mouth At Bedtime 4)  Clonidine Hcl 0.2 Mg  Tabs (Clonidine Hcl) .... Take 1 Tablet By Mouth At Bedtime 5)  Alphagan P 0.1 %  Soln (Brimonidine Tartrate) .... One Drop Each Eye Twice Daily As Needed 6)  Avandia 4 Mg  Tabs (Rosiglitazone Maleate) .... Take One Tab By Mouth Once Daily 7)  Clotrimazole-Betamethasone 1-0.05 %  Crea (Clotrimazole-Betamethasone) .... Apply Once Daily As Needed 8)  Synthroid 25 Mcg  Tabs (Levothyroxine Sodium) .... 1.5 Tab Once Daily 9)  Onetouch Lancets   Misc (Lancets) .... Once Daily Testing 10)  Onetouch Test   Strp (Glucose Blood) .... Once Daily Testing 11)  Phenazopyridine Hcl 100 Mg Tabs  (Phenazopyridine Hcl) .... Take One Tab Every 6 Hours Prn  Allergies (verified): 1)  ! Pcn 2)  ! Sulfa  Review of Systems General:  Denies chills, fatigue, and fever. ENT:  Denies hoarseness, nasal congestion, sinus pressure, and sore throat. CV:  Denies chest pain or discomfort, palpitations, and swelling of feet. Resp:  Denies cough, shortness of breath, sputum productive, and wheezing. GI:  Denies abdominal pain, constipation, diarrhea, nausea, and vomiting. GU:  Denies dysuria and urinary frequency. MS:  Denies joint pain, joint swelling, and stiffness. Psych:  See HPI; Denies suicidal thoughts/plans, thoughts of violence, and unusual visions or sounds. Endo:  Denies cold intolerance, excessive hunger, excessive thirst, excessive urination, heat intolerance, polyuria, and weight change.  Physical Exam  General:  alert, well-hydrated, and overweight-appearing.  HEENT: No facial asymmetry,  EOMI, No sinus tenderness, TM's Clear, oropharynx  pink and moist.   Chest: Clear to auscultation bilaterally.  CVS: S1, S2, No murmurs, No S3.   Abd: Soft, Nontender.  MS: Adequate ROM spine, hips, shoulders and knees.  Ext: No edema.   CNS: CN 2-12 intact, power tone and sensation normal throughout.   Skin: Intact, no visible lesions or rashes.  Psych: Good eye contact, normal affect.  Memory intact, not anxious or depressed appearing.    Diabetes Management Exam:    Foot Exam (with socks and/or shoes not present):  Sensory-Monofilament:          Left foot: normal          Right foot: normal       Inspection:          Left foot: normal          Right foot: normal       Nails:          Left foot: thickened          Right foot: thickened   Impression & Recommendations:  Problem # 1:  OBESITY (ICD-278.00) Assessment Improved  Ht: 65.5 (09/07/2008)   Wt: 216.50 (09/07/2008)   BMI: 35.61 (09/07/2008)  Problem # 2:  HYPOTHYROIDISM (ICD-244.9) Assessment: Comment Only  Her  updated medication list for this problem includes:    Synthroid 25 Mcg Tabs (Levothyroxine sodium) .Marland Kitchen... 1.5 tab once daily  Labs Reviewed: TSH: 3.140 (03/07/2008)    HgBA1c: 5.8 (03/07/2008) Chol: 149 (03/07/2008)   HDL: 48 (03/07/2008)   LDL: 76 (03/07/2008)   TG: 126 (03/07/2008)  Problem # 3:  DIABETES MELLITUS, TYPE II (ICD-250.00) Assessment: Unchanged  Her updated medication list for this problem includes:    Avalide 150-12.5 Mg Tabs (Irbesartan-hydrochlorothiazide) .Marland Kitchen... Take 1 tablet by mouth two times a day    Avandia 4 Mg Tabs (Rosiglitazone maleate) .Marland Kitchen... Take one tab by mouth once daily  Orders: Glucose, (CBG) (82962) Hemoglobin A1C (83036) T-Urine Microalbumin w/creat. ratio [phone] / [id number])  Labs Reviewed: Creat: 1.76 (03/07/2008)    Reviewed HgBA1c results: 5.8 (03/07/2008)  6.3 (12/06/2007)  Problem # 4:  HYPERTENSION (ICD-401.9) Assessment: Improved  Her updated medication list for this problem includes:    Metoprolol Succinate 50 Mg Tb24 (Metoprolol succinate) .Marland Kitchen... Take 1/2 tablet by mouth two times a day    Avalide 150-12.5 Mg Tabs (Irbesartan-hydrochlorothiazide) .Marland Kitchen... Take 1 tablet by mouth two times a day    Clonidine Hcl 0.2 Mg Tabs (Clonidine hcl) .Marland Kitchen... Take 1 tablet by mouth at bedtime  Orders: T-Basic Metabolic Panel [phone])  BP today: 120/62 Prior BP: 130/80 (03/07/2008)  Labs Reviewed: K+: 4.5 (03/07/2008) Creat: : 1.76 (03/07/2008)   Chol: 149 (03/07/2008)   HDL: 48 (03/07/2008)   LDL: 76 (03/07/2008)   TG: 126 (03/07/2008)  Problem # 5:  HYPERLIPIDEMIA (ICD-272.4) Assessment: Comment Only  Her updated medication list for this problem includes:    Simvastatin 80 Mg Tabs (Simvastatin) .Marland Kitchen... Take 1 tab by mouth at bedtime  Orders: T-Hepatic Function [phone]) T-Lipid Profile [phone])  Labs Reviewed: SGOT: 15 (03/07/2008)   SGPT: 13 (03/07/2008)   HDL:48 (03/07/2008), 41 (08/12/2007)  LDL:76 (03/07/2008), 53  (32/44/0102)  Chol:149 (03/07/2008), 127 (08/12/2007)  Trig:126 (03/07/2008), 163 (08/12/2007)  Problem # 6:  DEPRESSION (ICD-311) Assessment: Deteriorated  Her updated medication list for this problem includes:    Fluoxetine Hcl 10 Mg Tabs (Fluoxetine hcl) .Marland Kitchen... Take 1 tablet by mouth once a day  Complete Medication List: 1)  Metoprolol Succinate 50 Mg Tb24 (Metoprolol succinate) .... Take 1/2 tablet by mouth two times a day 2)  Avalide 150-12.5 Mg Tabs (Irbesartan-hydrochlorothiazide) .... Take 1 tablet by mouth two times a day 3)  Simvastatin 80 Mg Tabs (Simvastatin) .... Take 1 tab by mouth at bedtime 4)  Clonidine Hcl 0.2 Mg Tabs (Clonidine hcl) .... Take 1 tablet by mouth at bedtime 5)  Alphagan P 0.1 % Soln (Brimonidine tartrate) .... One drop each eye twice daily as needed 6)  Avandia 4 Mg Tabs (Rosiglitazone maleate) .... Take one tab  by mouth once daily 7)  Clotrimazole-betamethasone 1-0.05 % Crea (Clotrimazole-betamethasone) .... Apply once daily as needed 8)  Synthroid 25 Mcg Tabs (Levothyroxine sodium) .... 1.5 tab once daily 9)  Onetouch Lancets Misc (Lancets) .... Once daily testing 10)  Onetouch Test Strp (Glucose blood) .... Once daily testing 11)  Phenazopyridine Hcl 100 Mg Tabs (Phenazopyridine hcl) .... Take one tab every 6 hours prn 12)  Fluoxetine Hcl 10 Mg Tabs (Fluoxetine hcl) .... Take 1 tablet by mouth once a day  Other Orders: T-TSH ([id number]) Radiology Referral (Radiology)  Patient Instructions: 1)  Please schedule a follow-up appointment in 2 months. 2)  Congrats on weight loss, keep it up. 3)  Your nerve med is changed is now fluoxetie 20 mg 1 daiy.pLs stop the citalopram. 4)  BMP prior to visit, ICD-9: 5)  Hepatic Panel prior to visit, ICD-9:  fasting asap. 6)  Lipid Panel prior to visit, ICD-9: 7)  TSH prior to visit, ICD-9: 8)  Mamogram to be rescheduled by [place]. 9)  Urine Microalbumin prior to visit, ICD-9:  today Prescriptions: FLUOXETINE HCL  10 MG TABS (FLUOXETINE HCL) Take 1 tablet by mouth once a day  #90 x 1   Entered and Authorized by:   Syliva Overman MD   Signed by:   Syliva Overman MD on 09/07/2008   Method used:   Printed then faxed to ...       [name]* (retail)       [address]       [place], [place]  [address]       Ph: [phone]       Fax: [phone]   RxID:   [id number]     Laboratory Results   Blood Tests   Date/Time Received: 09/07/08 9:20am Date/Time Reported: 09/07/08 9:20am  Glucose (random): 155 mg/dL   (Normal Range: 27-253)     Appended Document: OV pls add hBA1C value to note  Appended Document: OV  Laboratory Results   Blood Tests   Date/Time Received: Sep 07, 2008 11 Date/Time Reported: Sep 07, 2008 11  HGBA1C: 6.3%   (Normal Range: Non-Diabetic - 3-6%   Control Diabetic - 6-8%)

## 2010-05-20 NOTE — Assessment & Plan Note (Signed)
Summary: office visit   Vital Signs:  Patient profile:   77 year old female Menstrual status:  hysterectomy Height:      65.5 inches Weight:      213 pounds BMI:     35.03 O2 Sat:      97 % Pulse rate:   87 / minute Resp:     16 per minute BP sitting:   130 / 80  (left arm) Cuff size:   large  Vitals Entered By: Everitt Amber LPN (October 02, 2009 9:23 AM) CC: Follow up chronic problems   CC:  Follow up chronic problems.  History of Present Illness: Reports  that she has been doing well. Denies recent fever or chills. Denies sinus pressure, nasal congestion , ear pain or sore throat. Denies chest congestion, or cough productive of sputum. Denies chest pain, palpitations, PND, orthopnea or leg swelling. Denies abdominal pain, nausea, vomitting, diarrhea or constipation. Denies change in bowel movements or bloody stool. Denies dysuria , frequency, incontinence or hesitancy. Denies  joint pain, swelling, or reduced mobility. Denies headaches, vertigo, seizures.  Denies  rash, lesions, or itch. she altered her diet with excellent results with weight loss, and she rmains active. Shetests her blood sugars at least 3 times weekly and they are excellent     Current Medications (verified): 1)  Metoprolol Succinate 50 Mg  Tb24 (Metoprolol Succinate) .... Take 1/2 Tablet By Mouth Two Times A Day 2)  Avalide 150-12.5 Mg  Tabs (Irbesartan-Hydrochlorothiazide) .... Take 1 Tablet By Mouth Two Times A Day 3)  Simvastatin 80 Mg  Tabs (Simvastatin) .... Take 1 Tab By Mouth At Bedtime 4)  Clonidine Hcl 0.2 Mg  Tabs (Clonidine Hcl) .... Take 1 Tablet By Mouth At Bedtime 5)  Alphagan P 0.1 %  Soln (Brimonidine Tartrate) .... One Drop Each Eye Twice Daily As Needed 6)  Synthroid 25 Mcg  Tabs (Levothyroxine Sodium) .... 1.5 Tab Once Daily 7)  Onetouch Lancets   Misc (Lancets) .... Once Daily Testing 8)  Onetouch Test   Strp (Glucose Blood) .... Once Daily Testing 9)  Citalopram Hydrobromide 20  Mg Tabs (Citalopram Hydrobromide) .... One Tab By Mouth Qd  Allergies (verified): 1)  ! Pcn 2)  ! Sulfa  Review of Systems      See HPI General:  Complains of fatigue. Eyes:  Denies discharge, eye pain, and red eye. Psych:  Complains of anxiety and depression; denies easily tearful, irritability, mental problems, suicidal thoughts/plans, thoughts of violence, and unusual visions or sounds; improved. Endo:  Denies cold intolerance, excessive hunger, excessive thirst, excessive urination, heat intolerance, polyuria, and weight change. Heme:  Denies abnormal bruising and bleeding. Allergy:  Denies hives or rash and itching eyes.  Physical Exam  General:  Well-developed,well-nourished,in no acute distress; alert,appropriate and cooperative throughout examination HEENT: No facial asymmetry,  EOMI, No sinus tenderness, TM's Clear, oropharynx  pink and moist.   Chest: Clear to auscultation bilaterally.  CVS: S1, S2, No murmurs, No S3.   Abd: Soft, Nontender.  MS: Adequate ROM spine, hips, shoulders and knees.  Ext: No edema.   CNS: CN 2-12 intact, power tone and sensation normal throughout.   Skin: Intact, no visible lesions or rashes.  Psych: Good eye contact, normal affect.  Memory intact, not anxious or depressed appearing.   Diabetes Management Exam:    Foot Exam (with socks and/or shoes not present):       Sensory-Monofilament:          Left  foot: diminished          Right foot: diminished       Inspection:          Left foot: normal          Right foot: normal       Nails:          Left foot: thickened          Right foot: thickened   Impression & Recommendations:  Problem # 1:  DEPRESSION (ICD-311) Assessment Improved  Her updated medication list for this problem includes:    Citalopram Hydrobromide 20 Mg Tabs (Citalopram hydrobromide) ..... One tab by mouth qd  Problem # 2:  HYPOTHYROIDISM (ICD-244.9) Assessment: Comment Only  Her updated medication list for  this problem includes:    Synthroid 25 Mcg Tabs (Levothyroxine sodium) .Marland Kitchen... 1.5 tab once daily  Labs Reviewed: TSH: 3.799 (07/02/2009)    HgBA1c: 6.8 (07/02/2009) Chol: 121 (07/02/2009)   HDL: 50 (07/02/2009)   LDL: 55 (07/02/2009)   TG: 82 (07/02/2009)  Problem # 3:  HYPERTENSION (ICD-401.9) Assessment: Unchanged  Her updated medication list for this problem includes:    Metoprolol Succinate 50 Mg Tb24 (Metoprolol succinate) .Marland Kitchen... Take 1/2 tablet by mouth two times a day    Avalide 150-12.5 Mg Tabs (Irbesartan-hydrochlorothiazide) .Marland Kitchen... Take 1 tablet by mouth two times a day    Clonidine Hcl 0.2 Mg Tabs (Clonidine hcl) .Marland Kitchen... Take 1 tablet by mouth at bedtime  BP today: 130/80 Prior BP: 120/70 (06/26/2009)  Labs Reviewed: K+: 4.3 (07/02/2009) Creat: : 1.28 (07/02/2009)   Chol: 121 (07/02/2009)   HDL: 50 (07/02/2009)   LDL: 55 (07/02/2009)   TG: 82 (07/02/2009)  Problem # 4:  DIABETES MELLITUS, TYPE II (ICD-250.00) Assessment: Comment Only  Her updated medication list for this problem includes:    Avalide 150-12.5 Mg Tabs (Irbesartan-hydrochlorothiazide) .Marland Kitchen... Take 1 tablet by mouth two times a day  Orders: T- Hemoglobin A1C ([id number]) T-Urine Microalbumin w/creat. ratio [phone])  Labs Reviewed: Creat: 1.28 (07/02/2009)    Reviewed HgBA1c results: 6.8 (07/02/2009)  6.5 (11/23/2008)  Problem # 5:  HYPERLIPIDEMIA (ICD-272.4) Assessment: Comment Only  Her updated medication list for this problem includes:    Simvastatin 80 Mg Tabs (Simvastatin) .Marland Kitchen... Take 1 tab by mouth at bedtime  Labs Reviewed: SGOT: 20 (07/02/2009)   SGPT: 22 (07/02/2009)   HDL:50 (07/02/2009), 42 (11/23/2008)  LDL:55 (07/02/2009), 54 (11/23/2008)  Chol:121 (07/02/2009), 114 (11/23/2008)  Trig:82 (07/02/2009), 89 (11/23/2008)  Problem # 6:  OBESITY (ICD-278.00) Assessment: Improved  Ht: 65.5 (10/02/2009)   Wt: 213 (10/02/2009)   BMI: 35.03 (10/02/2009)  Complete Medication List: 1)   Metoprolol Succinate 50 Mg Tb24 (Metoprolol succinate) .... Take 1/2 tablet by mouth two times a day 2)  Avalide 150-12.5 Mg Tabs (Irbesartan-hydrochlorothiazide) .... Take 1 tablet by mouth two times a day 3)  Simvastatin 80 Mg Tabs (Simvastatin) .... Take 1 tab by mouth at bedtime 4)  Clonidine Hcl 0.2 Mg Tabs (Clonidine hcl) .... Take 1 tablet by mouth at bedtime 5)  Alphagan P 0.1 % Soln (Brimonidine tartrate) .... One drop each eye twice daily as needed 6)  Synthroid 25 Mcg Tabs (Levothyroxine sodium) .... 1.5 tab once daily 7)  Onetouch Lancets Misc (Lancets) .... Once daily testing 8)  Onetouch Test Strp (Glucose blood) .... Once daily testing 9)  Citalopram Hydrobromide 20 Mg Tabs (Citalopram hydrobromide) .... One tab by mouth qd  Other Orders: Radiology Referral (Radiology)  Patient Instructions: 1)  cPE in 3.5 months. 2)  It is important that you exercise regularly at least 20 minutes 5 times a week. If you develop chest pain, have severe difficulty breathing, or feel very tired , stop exercising immediately and seek medical attention. 3)  You need to lose weight. Consider a lower calorie diet and regular exercise. congrats on weight loss, i hope you lose another 6 pounds in the next 3.5 months 4)  HbgA1C prior to visit, ICD-9:   today 5)  Urine Microalbumin prior to visit, ICD-9: 6)  pls start calcium with vit d 1200mg /1000iU  one daily 7)  we will sched your mamo due end August art [place] and let you know 8)  no med changes at this time

## 2010-05-20 NOTE — Progress Notes (Signed)
Summary: speak with doc  Phone Note Call from Patient   Summary of Call: needs to speak with dr.Ladarian Bonczek. [phone] Initial call taken by: Rudene Anda,  January 19, 2008 4:27 PM  Follow-up for Phone Call        RETURNED CALL MORE INFO , LEFT MESSAGE Follow-up by: Worthy Keeler LPN,  January 20, 2008 10:37 AM  Additional Follow-up for Phone Call Additional follow up Details #1::        nurse viist on monday between 8 to 9am  for CCUA  c/o burning with urination, pLS send for C?S also , lwet me know if CCUA is abn  Additional Follow-up by: Syliva Overman MD,  January 20, 2008 12:37 PM

## 2010-05-20 NOTE — Assessment & Plan Note (Signed)
Summary: urine spec  Nurse Visit    Prior Medications: METOPROLOL SUCCINATE 50 MG  TB24 (METOPROLOL SUCCINATE) Take 1/2 tablet by mouth two times a day AVALIDE 150-12.5 MG  TABS (IRBESARTAN-HYDROCHLOROTHIAZIDE) Take 1 tablet by mouth two times a day SIMVASTATIN 80 MG  TABS (SIMVASTATIN) Take 1 tab by mouth at bedtime CLONIDINE HCL 0.2 MG  TABS (CLONIDINE HCL) Take 1 tablet by mouth at bedtime IBUPROFEN 800 MG  TABS (IBUPROFEN) Take 1 tablet by mouth three times a day ALPHAGAN P 0.1 %  SOLN (BRIMONIDINE TARTRATE) One drop each eye twice daily as needed AVANDIA 4 MG  TABS (ROSIGLITAZONE MALEATE) Take one tab by mouth once daily FEXOFENADINE HCL 180 MG  TABS (FEXOFENADINE HCL) Take 1 tablet by mouth once a day CLOTRIMAZOLE-BETAMETHASONE 1-0.05 %  CREA (CLOTRIMAZOLE-BETAMETHASONE) apply once daily as needed SYNTHROID 25 MCG  TABS (LEVOTHYROXINE SODIUM) 1.5 tab once daily DIABETIC SHOES WITH INSERTS X 1PR. ()  ONETOUCH LANCETS   MISC (LANCETS) once daily testing ONETOUCH TEST   STRP (GLUCOSE BLOOD) once daily testing PHENAZOPYRIDINE HCL 100 MG TABS (PHENAZOPYRIDINE HCL) Take one tab every 6 hours prn IBU 800 MG TABS (IBUPROFEN) Take 1 tablet by mouth three times a day CIPROFLOXACIN HCL 500 MG TABS (CIPROFLOXACIN HCL) one tab by mouth bid Current Allergies: ! PCN ! SULFA Laboratory Results   Urine Tests  Date/Time Received: 05/10/08  Date/Time Reported: 05/10/08   Routine Urinalysis   Color: yellow Appearance: Clear Glucose: negative   (Normal Range: Negative) Bilirubin: negative   (Normal Range: Negative) Ketone: trace (5)   (Normal Range: Negative) Spec. Gravity: 1.020   (Normal Range: 1.003-1.035) Blood: negative   (Normal Range: Negative) pH: 5.5   (Normal Range: 5.0-8.0) Protein: negative   (Normal Range: Negative) Urobilinogen: 0.2   (Normal Range: 0-1) Nitrite: positive   (Normal Range: Negative) Leukocyte Esterace: negative   (Normal Range: Negative)          Orders Added: 1)  UA Dipstick W/ Micro (manual) [81000] 2)  T-Culture, Urine [[id number]]    ] Laboratory Results   Urine Tests    Routine Urinalysis   Color: yellow Appearance: Clear Glucose: negative   (Normal Range: Negative) Bilirubin: negative   (Normal Range: Negative) Ketone: trace (5)   (Normal Range: Negative) Spec. Gravity: 1.020   (Normal Range: 1.003-1.035) Blood: negative   (Normal Range: Negative) pH: 5.5   (Normal Range: 5.0-8.0) Protein: negative   (Normal Range: Negative) Urobilinogen: 0.2   (Normal Range: 0-1) Nitrite: positive   (Normal Range: Negative) Leukocyte Esterace: negative   (Normal Range: Negative)

## 2010-05-20 NOTE — Medication Information (Signed)
Summary: Tax adviser   Imported By: Lind Guest 02/27/2008 13:04:57  _____________________________________________________________________  External Attachment:    Type:   Image     Comment:   External Document

## 2010-05-20 NOTE — Assessment & Plan Note (Signed)
Summary: check up   Vital Signs:  Patient Profile:   77 Years Old Female Height:     60.4 inches Weight:      220 pounds BMI:     42.55 Pulse rate:   94 / minute Resp:     16 per minute BP sitting:   110 / 70  (left arm)  Pt. in pain?   no  Vitals Entered By: Worthy Keeler LPN (September 18, 1608 8:12 AM)                  Chief Complaint:  check up.  History of Present Illness: Patient reports that she has been doing well except for urinary symptoms. She recently saw her urologist and has an upcoming re eval. in 2 days. Although she has been on antibiotics she is still symptomatic Patient is concerned that levoxyl is causing hair loss, will change to non generic equivalent..    Current Allergies: ! PCN ! SULFA     Review of Systems  General      Denies chills, fatigue, and sleep disorder.  ENT      Denies hoarseness, nasal congestion, postnasal drainage, sinus pressure, and sore throat.  CV      Denies chest pain or discomfort, shortness of breath with exertion, swelling of feet, and swelling of hands.  Resp      Denies cough, shortness of breath, sputum productive, and wheezing.  GI      Denies abdominal pain, change in bowel habits, constipation, diarrhea, and vomiting.  GU      Complains of dysuria and urinary frequency.  MS      Denies joint pain and joint swelling.  Derm      Denies lesion(s) and rash.  Endo      Denies excessive urination and polyuria.   Physical Exam  General:     Well-developed,well-nourished,in no acute distress; alert,appropriate and cooperative throughout examination Head:     Normocephalic and atraumatic without obvious abnormalities. No apparent alopecia or balding. Ears:     R ear normal and L ear normal.   Nose:     no external deformity, no external erythema, and no nasal discharge.   Mouth:     Oral mucosa and oropharynx without lesions or exudates.  Teeth in good repair. Neck:     No deformities, masses, or  tenderness noted. Chest Wall:     No deformities, masses, or tenderness noted. Lungs:     Normal respiratory effort, chest expands symmetrically. Lungs are clear to auscultation, no crackles or wheezes. Heart:     Normal rate and regular rhythm. S1 and S2 normal without gallop, murmur, click, rub or other extra sounds. Abdomen:     soft and non-tender.   Msk:     normal ROM and no joint tenderness.   Extremities:     No clubbing, cyanosis, edema, or deformity noted with normal full range of motion of all joints.   Neurologic:     sensation intact to light touch.   Skin:     Intact without suspicious lesions or rashes    Impression & Recommendations:  Problem # 1:  BLADDER CANCER (ICD-188.9) Assessment: Unchanged Prblematic persistent UTI symptoms.  Problem # 2:  OBESITY (ICD-278.00) Assessment: Unchanged  Problem # 3:  ANXIETY (ICD-300.00) Assessment: Unchanged  Her updated medication list for this problem includes:    Citalopram Hydrobromide 20 Mg Tabs (Citalopram hydrobromide) .Marland Kitchen... Take 1 tablet by mouth once a day  Problem # 4:  HYPOTHYROIDISM (ICD-244.9)  The following medications were removed from the medication list:    Levothyroxine Sodium 25 Mcg Tabs (Levothyroxine sodium) .Marland Kitchen... Take 1 tablet by mouth once a day  Her updated medication list for this problem includes:    Synthroid 25 Mcg Tabs (Levothyroxine sodium) .Marland Kitchen... 1.5 tab once daily  Orders: T-TSH ([address]-60454)   Problem # 5:  DIABETES MELLITUS, TYPE II (ICD-250.00)  Her updated medication list for this problem includes:     Avandia 4 Mg Tabs (Rosiglitazone maleate) .Marland Kitchen... Take one half tablet by mouth once daily  Labs Reviewed: HgBA1c: 5.7 (04/20/2007)   Creat: 1.36 (08/12/2007)     Her updated medication list for this problem includes:    Avalide 150-12.5 Mg Tabs (Irbesartan-hydrochlorothiazide) .Marland Kitchen... Take 1 tablet by mouth two times a day    Avandia 4 Mg Tabs (Rosiglitazone maleate) .Marland Kitchen...  Take one half tablet by mouth once daily   Problem # 6:  HYPERTENSION (ICD-401.9) Assessment: Unchanged  Her updated medication list for this problem includes:    Metoprolol Succinate 50 Mg Tb24 (Metoprolol succinate) .Marland Kitchen... Take 1/2 tablet by mouth every day    Avalide 150-12.5 Mg Tabs (Irbesartan-hydrochlorothiazide) .Marland Kitchen... Take 1 tablet by mouth two times a day    Clonidine Hcl 0.2 Mg Tabs (Clonidine hcl) .Marland Kitchen... Take 1 tablet by mouth once a day   Problem # 7:  HYPERLIPIDEMIA (ICD-272.4) Assessment: Unchanged  Her updated medication list for this problem includes:    Simvastatin 80 Mg Tabs (Simvastatin) .Marland Kitchen... Take 1 tab by mouth at bedtime  Labs Reviewed: Chol: 127 (08/12/2007)   HDL: 41 (08/12/2007)   LDL: 53 (08/12/2007)   TG: 163 (08/12/2007) SGOT: 17 (08/12/2007)   SGPT: 17 (08/12/2007)   Problem # 8:  Preventive Health Care (ICD-V70.0) Assessment: Comment Only Appropriate vaccines administerd during OV.  Complete Medication List: 1)  Metoprolol Succinate 50 Mg Tb24 (Metoprolol succinate) .... Take 1/2 tablet by mouth every day 2)  Avalide 150-12.5 Mg Tabs (Irbesartan-hydrochlorothiazide) .... Take 1 tablet by mouth two times a day 3)  Simvastatin 80 Mg Tabs (Simvastatin) .... Take 1 tab by mouth at bedtime 4)  Clonidine Hcl 0.2 Mg Tabs (Clonidine hcl) .... Take 1 tablet by mouth once a day 5)  Ibuprofen 800 Mg Tabs (Ibuprofen) .... Take 1 tablet by mouth three times a day 6)  Phenazopyridine Hcl 100 Mg Tabs (Phenazopyridine hcl) .... Take one tablet by mouth every six hours as needed 7)  Nitrofurantoin Macrocrystal 100 Mg Caps (Nitrofurantoin macrocrystal) .... Take 1 tablet by mouth two times a day as needed 8)  Alphagan P 0.1 % Soln (Brimonidine tartrate) .... One drop each eye twice daily as needed 9)  Avandia 4 Mg Tabs (Rosiglitazone maleate) .... Take one half tablet by mouth once daily 10)  Fexofenadine Hcl 180 Mg Tabs (Fexofenadine hcl) .... Take 1 tablet by mouth  once a day 11)  Citalopram Hydrobromide 20 Mg Tabs (Citalopram hydrobromide) .... Take 1 tablet by mouth once a day 12)  Clotrimazole-betamethasone 1-0.05 % Crea (Clotrimazole-betamethasone) .... Apply once daily as needed 13)  Cetirizine Hcl 10 Mg Tabs (Cetirizine hcl) .... One tab by mouth once daily 14)  Synthroid 25 Mcg Tabs (Levothyroxine sodium) .... 1.5 tab once daily 15)  Diabetic Shoes With Inserts X 1pr.   Other Orders: Tdap => 58yrs IM (09811) Pneumococcal Vaccine (91478) Admin 1st Vaccine (29562) Admin of Any Addtl Vaccine (13086)   Patient Instructions: 1)  Please schedule a follow-up  appointment in 2 months. 2)  It is important that you exercise regularly at least 20 minutes 5 times a week. If you develop chest pain, have severe difficulty breathing, or feel very tired , stop exercising immediately and seek medical attention. 3)  You need to lose weight. Consider a lower calorie diet and regular exercise.  4)  Check your blood sugars regularly. If your readings are usually above : or below 70 you should contact our office. 5)  Check your Blood Pressure regularly. If it is above 160/95 : you should make an appointment.   Prescriptions: SYNTHROID 25 MCG  TABS (LEVOTHYROXINE SODIUM) 1.5 tab once daily Brand medically necessary #45 x 45   Entered and Authorized by:   Syliva Overman MD   Signed by:   Syliva Overman MD on 09/19/2007   Method used:   Handwritten   RxID:   [id number] LEVOTHYROXINE SODIUM 25 MCG  TABS (LEVOTHYROXINE SODIUM) Take 1 tablet by mouth once a day  #45 x 5   Entered by:   Chipper Herb   Authorized by:   Syliva Overman MD   Signed by:   Chipper Herb on 09/19/2007   Method used:   Handwritten   RxID:   [id number]  ] Laboratory Results   Blood Tests   Date/Time Received: 09/19/07 8:43am Date/Time Reported: 09/19/07 8:43am  Glucose (random): 99 mg/dL   (Normal Range: 14-782)  Date/Time Received: September 19, 2007 9:06  AM Date/Time Reported: September 19, 2007 9:06 AM  Stool - Occult Blood Hemmoccult #1: negative Date: 09/19/2007    Orders Added: 1)  Tdap => 31yrs IM [90715] 2)  Pneumococcal Vaccine [90732] 3)  Admin 1st Vaccine [90471] 4)  Admin of Any Addtl Vaccine [90472] 5)  T-TSH [[id number]]    Tetanus/Td Vaccine    Vaccine Type: Tdap    Site: right deltoid    Mfr: Sanofi Pasteur    Dose: 0.5 ml    Route: IM    Given by: Chipper Herb    Exp. Date: 05/26/2009    Lot #: [id number]    VIS given: 03/08/07 version given September 19, 2007.  Pneumovax Vaccine    Vaccine Type: Pneumovax    Site: left deltoid    Mfr: Merck    Dose: 0.5 ml    Route: IM    Given by: Chipper Herb    Exp. Date: 09/17/2008    Lot #: [id number]    VIS given: 11/16/95 version given September 19, 2007.

## 2010-05-20 NOTE — Progress Notes (Signed)
Summary: TEST RESULTS  Phone Note Call from Patient   Summary of Call: WAS WONDERING ABOUT HER TESTS THAT SHE HAD LAST WEEK Initial call taken by: Lind Guest,  December 10, 2008 2:43 PM  Follow-up for Phone Call        patient aware  Follow-up by: Worthy Keeler LPN,  December 10, 2008 3:06 PM

## 2010-05-20 NOTE — Miscellaneous (Signed)
Summary: Refill  Clinical Lists Changes  Medications: Rx of PHENAZOPYRIDINE HCL 100 MG TABS (PHENAZOPYRIDINE HCL) Take one tab every 6 hours prn;  #30 Tablet x 3;  Signed;  Entered by: Everitt Amber;  Authorized by: Syliva Overman MD;  Method used: Electronically to [facility] # [id number]*, [address], [place], [place]  [address], Ph: [phone] or [phone], Fax: [phone]    Prescriptions: PHENAZOPYRIDINE HCL 100 MG TABS (PHENAZOPYRIDINE HCL) Take one tab every 6 hours prn  #30 Tablet x 3   Entered by:   Everitt Amber   Authorized by:   Syliva Overman MD   Signed by:   Everitt Amber on 04/23/2008   Method used:   Electronically to        [facility] # [phone]* (retail)       [address]       [place], [place]  [address]       Ph: [phone] or [phone]       Fax: [phone]   RxID:   [id number]

## 2010-05-20 NOTE — Progress Notes (Signed)
Summary: [facility]  [facility]   Imported By: Lind Guest 06/24/2009 15:33:49  _____________________________________________________________________  External Attachment:    Type:   Image     Comment:   External Document

## 2010-05-20 NOTE — Miscellaneous (Signed)
  Clinical Lists Changes  Orders: Added new Test order of T-Basic Metabolic Panel [phone]) - Signed Added new Test order of T-Hepatic Function [phone]) - Signed Added new Test order of T-Lipid Profile [phone]) - Signed Added new Test order of T-TSH ([id number]) - Signed

## 2010-05-20 NOTE — Assessment & Plan Note (Signed)
Summary: SPEC  Nurse Visit   Vitals Entered By: Worthy Keeler LPN (April 18, 2008 11:41 AM)             Comments patient complains of burning with urination     Prior Medications: METOPROLOL SUCCINATE 50 MG  TB24 (METOPROLOL SUCCINATE) Take 1/2 tablet by mouth two times a day AVALIDE 150-12.5 MG  TABS (IRBESARTAN-HYDROCHLOROTHIAZIDE) Take 1 tablet by mouth two times a day SIMVASTATIN 80 MG  TABS (SIMVASTATIN) Take 1 tab by mouth at bedtime CLONIDINE HCL 0.2 MG  TABS (CLONIDINE HCL) Take 1 tablet by mouth at bedtime IBUPROFEN 800 MG  TABS (IBUPROFEN) Take 1 tablet by mouth three times a day ALPHAGAN P 0.1 %  SOLN (BRIMONIDINE TARTRATE) One drop each eye twice daily as needed AVANDIA 4 MG  TABS (ROSIGLITAZONE MALEATE) Take one tab by mouth once daily FEXOFENADINE HCL 180 MG  TABS (FEXOFENADINE HCL) Take 1 tablet by mouth once a day CLOTRIMAZOLE-BETAMETHASONE 1-0.05 %  CREA (CLOTRIMAZOLE-BETAMETHASONE) apply once daily as needed SYNTHROID 25 MCG  TABS (LEVOTHYROXINE SODIUM) 1.5 tab once daily DIABETIC SHOES WITH INSERTS X 1PR. ()  ONETOUCH LANCETS   MISC (LANCETS) once daily testing ONETOUCH TEST   STRP (GLUCOSE BLOOD) once daily testing PHENAZOPYRIDINE HCL 100 MG TABS (PHENAZOPYRIDINE HCL) Take one tab every 6 hours prn IBU 800 MG TABS (IBUPROFEN) Take 1 tablet by mouth three times a day Current Allergies: ! PCN ! SULFA Laboratory Results   Urine Tests  Date/Time Received: 04/18/08 11:30am Date/Time Reported: 04/18/08 11:30am  Routine Urinalysis   Color: orange Appearance: Clear Glucose: negative   (Normal Range: Negative) Bilirubin: negative   (Normal Range: Negative) Ketone: trace (5)   (Normal Range: Negative) Spec. Gravity: 1.025   (Normal Range: 1.003-1.035) Blood: negative   (Normal Range: Negative) pH: 5.5   (Normal Range: 5.0-8.0) Protein: negative   (Normal Range: Negative) Urobilinogen: 0.2   (Normal Range: 0-1) Nitrite: positive   (Normal Range:  Negative) Leukocyte Esterace: trace   (Normal Range: Negative)         Orders Added: 1)  UA Dipstick W/ Micro (manual) [81000] 2)  T-Culture, Urine [[id number]]    Prescriptions: CIPROFLOXACIN HCL 500 MG TABS (CIPROFLOXACIN HCL) one tab by mouth bid  #14 x 0   Entered by:   Worthy Keeler LPN   Authorized by:   Syliva Overman MD   Signed by:   Worthy Keeler LPN on 63/87/5643   Method used:   Electronically to        [facility]* (retail)       [address]       [place], [place]  [address]       Ph: [phone]       Fax: [phone]   RxID:   [id number]  ] Laboratory Results   Urine Tests    Routine Urinalysis   Color: orange Appearance: Clear Glucose: negative   (Normal Range: Negative) Bilirubin: negative   (Normal Range: Negative) Ketone: trace (5)   (Normal Range: Negative) Spec. Gravity: 1.025   (Normal Range: 1.003-1.035) Blood: negative   (Normal Range: Negative) pH: 5.5   (Normal Range: 5.0-8.0) Protein: negative   (Normal Range: Negative) Urobilinogen: 0.2   (Normal Range: 0-1) Nitrite: positive   (Normal Range: Negative) Leukocyte Esterace: trace   (Normal Range: Negative)

## 2010-05-20 NOTE — Assessment & Plan Note (Signed)
Summary: NURSE VISIT  Nurse Visit    Prior Medications: METOPROLOL SUCCINATE 50 MG  TB24 (METOPROLOL SUCCINATE) Take 1/2 tablet by mouth two times a day AVALIDE 150-12.5 MG  TABS (IRBESARTAN-HYDROCHLOROTHIAZIDE) Take 1 tablet by mouth two times a day SIMVASTATIN 80 MG  TABS (SIMVASTATIN) Take 1 tab by mouth at bedtime CLONIDINE HCL 0.2 MG  TABS (CLONIDINE HCL) Take 1 tablet by mouth at bedtime IBUPROFEN 800 MG  TABS (IBUPROFEN) Take 1 tablet by mouth three times a day ALPHAGAN P 0.1 %  SOLN (BRIMONIDINE TARTRATE) One drop each eye twice daily as needed AVANDIA 4 MG  TABS (ROSIGLITAZONE MALEATE) Take one tab by mouth once daily FEXOFENADINE HCL 180 MG  TABS (FEXOFENADINE HCL) Take 1 tablet by mouth once a day CITALOPRAM HYDROBROMIDE 20 MG  TABS (CITALOPRAM HYDROBROMIDE) Take 1 tablet by mouth once a day CLOTRIMAZOLE-BETAMETHASONE 1-0.05 %  CREA (CLOTRIMAZOLE-BETAMETHASONE) apply once daily as needed CETIRIZINE HCL 10 MG  TABS (CETIRIZINE HCL) one tab by mouth once daily SYNTHROID 25 MCG  TABS (LEVOTHYROXINE SODIUM) 1.5 tab once daily DIABETIC SHOES WITH INSERTS X 1PR. ()  ONETOUCH LANCETS   MISC (LANCETS) once daily testing ONETOUCH TEST   STRP (GLUCOSE BLOOD) once daily testing Current Allergies: ! PCN ! SULFA Laboratory Results   Urine Tests  Date/Time Received: January 23, 2008 9:00am Date/Time Reported: January 23, 2008 9:00am  Routine Urinalysis   Color: yellow Appearance: Clear Glucose: negative   (Normal Range: Negative) Bilirubin: negative   (Normal Range: Negative) Ketone: negative   (Normal Range: Negative) Spec. Gravity: 1.025   (Normal Range: 1.003-1.035) Blood: negative   (Normal Range: Negative) pH: 5.5   (Normal Range: 5.0-8.0) Protein: negative   (Normal Range: Negative) Urobilinogen: 0.2   (Normal Range: 0-1) Nitrite: positive   (Normal Range: Negative) Leukocyte Esterace: negative   (Normal Range: Negative)         Orders Added: 1)  UA Dipstick  W/ Micro (manual) [81000]    ]  Appended Document: NURSE VISIT    Nurse Visit    Prior Medications: METOPROLOL SUCCINATE 50 MG  TB24 (METOPROLOL SUCCINATE) Take 1/2 tablet by mouth two times a day AVALIDE 150-12.5 MG  TABS (IRBESARTAN-HYDROCHLOROTHIAZIDE) Take 1 tablet by mouth two times a day SIMVASTATIN 80 MG  TABS (SIMVASTATIN) Take 1 tab by mouth at bedtime CLONIDINE HCL 0.2 MG  TABS (CLONIDINE HCL) Take 1 tablet by mouth at bedtime IBUPROFEN 800 MG  TABS (IBUPROFEN) Take 1 tablet by mouth three times a day ALPHAGAN P 0.1 %  SOLN (BRIMONIDINE TARTRATE) One drop each eye twice daily as needed AVANDIA 4 MG  TABS (ROSIGLITAZONE MALEATE) Take one tab by mouth once daily FEXOFENADINE HCL 180 MG  TABS (FEXOFENADINE HCL) Take 1 tablet by mouth once a day CITALOPRAM HYDROBROMIDE 20 MG  TABS (CITALOPRAM HYDROBROMIDE) Take 1 tablet by mouth once a day CLOTRIMAZOLE-BETAMETHASONE 1-0.05 %  CREA (CLOTRIMAZOLE-BETAMETHASONE) apply once daily as needed CETIRIZINE HCL 10 MG  TABS (CETIRIZINE HCL) one tab by mouth once daily SYNTHROID 25 MCG  TABS (LEVOTHYROXINE SODIUM) 1.5 tab once daily DIABETIC SHOES WITH INSERTS X 1PR. ()  ONETOUCH LANCETS   MISC (LANCETS) once daily testing ONETOUCH TEST   STRP (GLUCOSE BLOOD) once daily testing Current Allergies: ! PCN ! SULFA      Prescriptions: CIPROFLOXACIN HCL 500 MG TABS (CIPROFLOXACIN HCL) One tab two times a day  #14 x 0   Entered by:   Everitt Amber   Authorized by:  Syliva Overman MD   Signed by:   Everitt Amber on 01/23/2008   Method used:   Electronically to        [facility]* (retail)       [address]       [place], [place]  [address]       Ph: [phone]       Fax: [phone]   RxID:   [id number]  ]  Appended Document: NURSE VISIT    Nurse Visit    Prior Medications: METOPROLOL SUCCINATE 50 MG  TB24 (METOPROLOL SUCCINATE) Take 1/2 tablet by mouth two times a day AVALIDE  150-12.5 MG  TABS (IRBESARTAN-HYDROCHLOROTHIAZIDE) Take 1 tablet by mouth two times a day SIMVASTATIN 80 MG  TABS (SIMVASTATIN) Take 1 tab by mouth at bedtime CLONIDINE HCL 0.2 MG  TABS (CLONIDINE HCL) Take 1 tablet by mouth at bedtime IBUPROFEN 800 MG  TABS (IBUPROFEN) Take 1 tablet by mouth three times a day ALPHAGAN P 0.1 %  SOLN (BRIMONIDINE TARTRATE) One drop each eye twice daily as needed AVANDIA 4 MG  TABS (ROSIGLITAZONE MALEATE) Take one tab by mouth once daily FEXOFENADINE HCL 180 MG  TABS (FEXOFENADINE HCL) Take 1 tablet by mouth once a day CITALOPRAM HYDROBROMIDE 20 MG  TABS (CITALOPRAM HYDROBROMIDE) Take 1 tablet by mouth once a day CLOTRIMAZOLE-BETAMETHASONE 1-0.05 %  CREA (CLOTRIMAZOLE-BETAMETHASONE) apply once daily as needed CETIRIZINE HCL 10 MG  TABS (CETIRIZINE HCL) one tab by mouth once daily SYNTHROID 25 MCG  TABS (LEVOTHYROXINE SODIUM) 1.5 tab once daily DIABETIC SHOES WITH INSERTS X 1PR. ()  ONETOUCH LANCETS   MISC (LANCETS) once daily testing ONETOUCH TEST   STRP (GLUCOSE BLOOD) once daily testing Current Allergies: ! PCN ! SULFA    Orders Added: 1)  T-Culture, Urine [[id number]]    ]

## 2010-05-20 NOTE — Progress Notes (Signed)
Summary: MAMMO  Phone Note Call from Patient   Summary of Call: INSTEAD OF HAVING A MAMMOGRAM SHE WANTS TO KNOW CAN SHE HAVE A MRI BECAUSE EVERYTIME SHE HAS APPT FOR A MAMMOGRAM SHE GETS SICK OR CRAZY SHE SAYS THAT A MAMMO HURTS AND SCARES HER  CALL BACK AT [phone] Initial call taken by: Lind Guest,  March 11, 2010 4:54 PM  Follow-up for Phone Call        pls explain that mRI are not covered /payed for by someonee who has no h/o breast CA and also not recommended, so encourage her to not "get crazy" and take her mamo in style Follow-up by: Syliva Overman MD,  March 12, 2010 7:22 AM  Additional Follow-up for Phone Call Additional follow up Details #1::        She states she feels like she is going to pass out when she has the [facility], but she will do it. Additional Follow-up by: Curtis Sites,  March 12, 2010 8:41 AM

## 2010-05-22 NOTE — Assessment & Plan Note (Signed)
Summary: rt leg pain needs xr/bcbs/mcr/bsf   Vital Signs:  Patient profile:   77 year old female Menstrual status:  hysterectomy Height:      65.5 inches Weight:      221 pounds BMI:     36.35 Pulse rate:   84 / minute Resp:     16 per minute  Visit Type:  new patient Referring Provider:  self Primary Provider:  Dr. Lodema Hong  CC:  right leg pain.  History of Present Illness: I saw Linda Richardson in the office today for an initial visit.  She is a 77 years old woman with the complaint of:  right leg pain, back pain.  No injury.  Meds: Tylenol Arthritis pain med 650mg  helps.  Other meds in EMR.  This 77 year old female with burning pain from her RIGHT hip down her RIGHT leg area  Allergies: 1)  ! Pcn 2)  ! Sulfa  Family History: MOTHER DECEASED STROKE FATHER DECEASED KILLED ONE SISTER LIVING HEALTH UNKNOWN Family History of Diabetes Family History Coronary Heart Disease female < 78 Family History of Arthritis  Social History: WIDOW Former Smoker Alcohol use-no Drug use-no no caffeine three adult children, one son killed by Company secretary  at age 59  Review of Systems Constitutional:  Denies weight loss, weight gain, fever, chills, and fatigue. Cardiovascular:  Denies chest pain, palpitations, fainting, and murmurs. Respiratory:  Denies short of breath, wheezing, couch, tightness, pain on inspiration, and snoring . Gastrointestinal:  Denies heartburn, nausea, vomiting, diarrhea, constipation, and blood in your stools. Genitourinary:  Denies frequency, urgency, difficulty urinating, painful urination, flank pain, and bleeding in urine. Neurologic:  Denies numbness, tingling, unsteady gait, dizziness, tremors, and seizure. Musculoskeletal:  Complains of muscle pain; denies joint pain, swelling, instability, stiffness, redness, and heat. Endocrine:  Denies excessive thirst, exessive urination, and heat or cold intolerance. Psychiatric:  Denies nervousness, depression,  anxiety, and hallucinations. Skin:  Denies changes in the skin, poor healing, rash, itching, and redness. HEENT:  Denies blurred or double vision, eye pain, redness, and watering. Immunology:  Complains of seasonal allergies; denies sinus problems and allergic to bee stings. Hemoatologic:  Denies easy bleeding and brusing.  Physical Exam  Additional Exam:  vital signs are weight 221 pounds, pulse 84, respiratory rate 16, height 5 foot 5.  The patient has tenderness in the lower back and RIGHT gluteal area. She has a positive straight leg raise. Shows normal pulse, and perfusion in her RIGHT foot. Normal range of motion in her RIGHT hip.  Reflexes are normal and the RIGHT knee.  Range of motion is normal on the RIGHT knee.  Strength was normal and the RIGHT leg.   Impression & Recommendations:  Problem # 1:  SCIATICA (ICD-724.3) Assessment New  Orders: New Patient Level II ([address]109)  Medications Added to Medication List This Visit: 1)  Neurontin 100 Mg Caps (Gabapentin) .Marland Kitchen.. 1-2 by mouth q hs as needed for right leg pain  Patient Instructions: 1)  Please schedule a follow-up appointment as needed. Prescriptions: NEURONTIN 100 MG CAPS (GABAPENTIN) 1-2 by mouth q hs as needed for right leg pain  #60 x 1   Entered and Authorized by:   Fuller Canada MD   Signed by:   Fuller Canada MD on 05/07/2010   Method used:   Faxed to ...       [name]* (retail)       [address]       [place]  [place], [place]  [address]       Ph: [phone]       Fax: [phone]   RxID:   [id number]    Orders Added: 1)  New Patient Level II [01027]

## 2010-05-22 NOTE — Letter (Signed)
Summary: History form  History form   Imported By: Jacklynn Ganong 05/13/2010 14:01:16  _____________________________________________________________________  External Attachment:    Type:   Image     Comment:   External Document

## 2010-06-11 ENCOUNTER — Telehealth: Payer: Self-pay | Admitting: Family Medicine

## 2010-06-17 NOTE — Progress Notes (Signed)
Summary: refill  Phone Note Call from Patient   Summary of Call: [facility] needs pt rx faxed back to them please. [phone] Initial call taken by: Rudene Anda,  June 11, 2010 9:47 AM  Follow-up for Phone Call        we do not respond to medical supply companies unless patient calls and requests  Follow-up by: Adella Hare LPN,  June 11, 2010 9:56 AM

## 2010-07-04 ENCOUNTER — Ambulatory Visit: Payer: Medicare Other | Admitting: Family Medicine

## 2010-09-05 NOTE — H&P (Signed)
NAME:  Linda Richardson, Linda Richardson             ACCOUNT NO.:  [id number]   MEDICAL RECORD NO.:  [id number]          PATIENT TYPE:  AMB   LOCATION:  DAY                           FACILITY:  [facility]   PHYSICIAN:  Leroy C. Katrinka Blazing, M.D.   DATE OF BIRTH:  15-Mar-1924   DATE OF ADMISSION:  DATE OF DISCHARGE:  LH                                HISTORY & PHYSICAL   A 77 year old female with a history of nausea.  She has noted increasing  difficulty with constipation.  She has noted rectal bleeding x2.  Her stool  size seems to be softer and smaller.  She uses calcium chronically.  She has  a history of hemorrhoids.  Her weight is variable.   PAST MEDICAL HISTORY:  Positive for hypertension, hyperlipidemia, and  depression.  She had H. pylori gastritis and has completed Helicobacter  therapy.   MEDICATIONS:  1.  Zocor 40 mg q.h.s.  2.  Lexapro 10 mg daily.  3.  Metoprolol 50 mg 1/2 b.i.d.  4.  Avalide 150 12.5 b.i.d.   SURGEON:  Hysterectomy.   PHYSICAL EXAMINATION:  VITAL SIGNS:  Blood pressure 152/84, pulse 68,  respirations 20.  Weight 210 pounds.  HEENT:  No abnormality noted.  NECK:  Supple without JVD, bruits, adenopathy, or thyromegaly.  CHEST:  Clear to auscultation.  HEART:  Regular rate and rhythm without murmur, rub or gallop.  ABDOMEN:  Soft, nontender.  No masses.  EXTREMITIES:  No clubbing, cyanosis or edema.  NEUROLOGIC:  No focal motor, sensory, or cerebellar deficit.   IMPRESSION:  1.  Rectal bleeding.  2.  Constipation.  3.  Hypertension.  4.  Depression.   PLAN:  Colonoscopy.      LCS/MEDQ  D:  09/03/2004  T:  09/03/2004  Job:  [id number]

## 2010-09-06 ENCOUNTER — Other Ambulatory Visit: Payer: Self-pay | Admitting: Family Medicine

## 2010-10-29 ENCOUNTER — Encounter: Payer: Self-pay | Admitting: Family Medicine

## 2010-11-04 ENCOUNTER — Encounter: Payer: Self-pay | Admitting: Family Medicine

## 2010-11-04 ENCOUNTER — Ambulatory Visit (INDEPENDENT_AMBULATORY_CARE_PROVIDER_SITE_OTHER): Payer: Medicare Other | Admitting: Family Medicine

## 2010-11-04 VITALS — BP 110/60 | HR 57 | Resp 16 | Ht 64.5 in | Wt 217.0 lb

## 2010-11-04 DIAGNOSIS — N3000 Acute cystitis without hematuria: Secondary | ICD-10-CM

## 2010-11-04 DIAGNOSIS — F3289 Other specified depressive episodes: Secondary | ICD-10-CM

## 2010-11-04 DIAGNOSIS — E039 Hypothyroidism, unspecified: Secondary | ICD-10-CM

## 2010-11-04 DIAGNOSIS — E119 Type 2 diabetes mellitus without complications: Secondary | ICD-10-CM

## 2010-11-04 DIAGNOSIS — F329 Major depressive disorder, single episode, unspecified: Secondary | ICD-10-CM

## 2010-11-04 DIAGNOSIS — N390 Urinary tract infection, site not specified: Secondary | ICD-10-CM

## 2010-11-04 DIAGNOSIS — E785 Hyperlipidemia, unspecified: Secondary | ICD-10-CM

## 2010-11-04 DIAGNOSIS — I10 Essential (primary) hypertension: Secondary | ICD-10-CM

## 2010-11-04 LAB — POCT URINALYSIS DIPSTICK
Bilirubin, UA: NEGATIVE
Glucose, UA: NEGATIVE
Ketones, UA: NEGATIVE
Nitrite, UA: NEGATIVE
Protein, UA: NEGATIVE
Spec Grav, UA: 1.02
Urobilinogen, UA: 1
pH, UA: 5.5

## 2010-11-04 MED ORDER — LEVOTHYROXINE SODIUM 25 MCG PO TABS: ORAL_TABLET | ORAL | Status: DC

## 2010-11-04 MED ORDER — IRBESARTAN-HYDROCHLOROTHIAZIDE 150-12.5 MG PO TABS: 1.0000 | ORAL_TABLET | Freq: Two times a day (BID) | ORAL | Status: DC

## 2010-11-04 MED ORDER — CIPROFLOXACIN HCL 500 MG PO TABS: 500.0000 mg | ORAL_TABLET | Freq: Two times a day (BID) | ORAL | Status: AC

## 2010-11-04 MED ORDER — SIMVASTATIN 40 MG PO TABS: 40.0000 mg | ORAL_TABLET | Freq: Every day | ORAL | Status: DC

## 2010-11-04 MED ORDER — CLONIDINE HCL 0.2 MG PO TABS: 0.2000 mg | ORAL_TABLET | Freq: Every day | ORAL | Status: DC

## 2010-11-04 MED ORDER — CITALOPRAM HYDROBROMIDE 20 MG PO TABS: 20.0000 mg | ORAL_TABLET | Freq: Every day | ORAL | Status: DC

## 2010-11-04 MED ORDER — METOPROLOL SUCCINATE ER 50 MG PO TB24: ORAL_TABLET | ORAL | Status: DC

## 2010-11-04 NOTE — Patient Instructions (Signed)
CPE in 3 months  Labs today and urine to be checked.   Pls DO your mammogram, we will sched for August, in [place]  It is important that you exercise regularly at least 30 minutes 5 times a week. If you develop chest pain, have severe difficulty breathing, or feel very tired, stop exercising immediately and seek medical attention    A healthy diet is rich in fruit, vegetables and whole grains. Poultry fish, nuts and beans are a healthy choice for protein rather then red meat. A low sodium diet and drinking 64 ounces of water daily is generally recommended. Oils and sweet should be limited. Carbohydrates especially for those who are diabetic or overweight, should be limited to 34-45 gram per meal. It is important to eat on a regular schedule, at least 3 times daily. Snacks should be primarily fruits, vegetables or nuts.

## 2010-11-05 ENCOUNTER — Encounter: Payer: Self-pay | Admitting: Family Medicine

## 2010-11-05 LAB — HEPATIC FUNCTION PANEL
ALT: 22 U/L (ref 0–35)
AST: 20 U/L (ref 0–37)
Albumin: 4.1 g/dL (ref 3.5–5.2)
Alkaline Phosphatase: 47 U/L (ref 39–117)
Bilirubin, Direct: 0.1 mg/dL (ref 0.0–0.3)
Indirect Bilirubin: 0.3 mg/dL (ref 0.0–0.9)
Total Bilirubin: 0.4 mg/dL (ref 0.3–1.2)
Total Protein: 7.1 g/dL (ref 6.0–8.3)

## 2010-11-05 LAB — LIPID PANEL
Cholesterol: 133 mg/dL (ref 0–200)
HDL: 50 mg/dL (ref >39)
LDL Cholesterol: 69 mg/dL (ref 0–99)
Total CHOL/HDL Ratio: 2.7 Ratio
Triglycerides: 68 mg/dL (ref <150)
VLDL: 14 mg/dL (ref 0–40)

## 2010-11-05 LAB — TSH: TSH: 2.537 u[IU]/mL (ref 0.350–4.500)

## 2010-11-05 LAB — BASIC METABOLIC PANEL
BUN: 23 mg/dL (ref 6–23)
CO2: 23 mEq/L (ref 19–32)
Calcium: 9.2 mg/dL (ref 8.4–10.5)
Chloride: 109 mEq/L (ref 96–112)
Creat: 1.24 mg/dL — ABNORMAL HIGH (ref 0.50–1.10)
Glucose, Bld: 128 mg/dL — ABNORMAL HIGH (ref 70–99)
Potassium: 4.1 mEq/L (ref 3.5–5.3)
Sodium: 139 mEq/L (ref 135–145)

## 2010-11-05 NOTE — Progress Notes (Signed)
  Subjective:    Patient ID: Linda Richardson, female    DOB: October 20, 1923, 77 y.o.   MRN: [id number]  HPI The PT is here for follow up and re-evaluation of chronic medical conditions, medication management and review of any  recent lab and radiology data.  Preventive health is updated, specifically  Cancer screening, Osteoporosis screening and Immunization.   The PT denies any adverse reactions to current medications since the last visit.  There are no new concerns.  Reports increased stress over the last several months , with ill health in her family. Tests sugar once or twice weekly, fasting sugars are between 113 to 125 reportedly    Review of Systems Denies recent fever or chills. Denies sinus pressure, nasal congestion, ear pain or sore throat. Denies chest congestion, productive cough or wheezing. Denies chest pains, palpitations, paroxysmal nocturnal dyspnea, orthopnea and leg swelling Denies abdominal pain, nausea, vomiting,diarrhea or constipation.  Denies rectal bleeding or change in bowel movement. 3 day h/o  Dysuria and  Frequency,denies flank pain Back, hip and knee  pain, and stiffness, with mild  limitation in mobility. Denies headaches, seizure, numbness, or tingling. Denies uncontrolled  depression, anxiety or insomnia. Denies skin break down or rash.        Objective:   Physical Exam Patient alert and oriented and in no Cardiopulmonary distress.  HEENT: No facial asymmetry, EOMI, no sinus tenderness, TM's clear, Oropharynx pink and moist.  Neck supple no adenopathy.  Chest: Clear to auscultation bilaterally.  CVS: S1, S2 no murmurs, no S3.  ABD: Soft non tender. Bowel sounds normal.No renal angle tenderness, mild suprapubic tenderness  Ext: No edema  MS: decreased ROM spine, shoulders, hips and knees.  Skin: Intact, no ulcerations or rash noted.  Psych: Good eye contact, normal affect. Memory intact not anxious or depressed appearing.  CNS: CN 2-12  intact, power, tone and sensation normal throughout.        Assessment & Plan:

## 2010-11-05 NOTE — Assessment & Plan Note (Signed)
Controlled on current med, continue same 

## 2010-11-05 NOTE — Assessment & Plan Note (Signed)
Symptomatic , with abnormal UA, will treat empirically and send urine for c/s

## 2010-11-05 NOTE — Assessment & Plan Note (Signed)
Low fat diet discussed and encouraged, need updated labs

## 2010-11-05 NOTE — Assessment & Plan Note (Signed)
Need updated lab data to asses control

## 2010-11-05 NOTE — Assessment & Plan Note (Signed)
Controlled, no change in medication  

## 2010-11-06 ENCOUNTER — Telehealth: Payer: Self-pay | Admitting: Family Medicine

## 2010-11-06 LAB — HEMOGLOBIN A1C
Hgb A1c MFr Bld: 6.7 % — ABNORMAL HIGH (ref <5.7)
Mean Plasma Glucose: 146 mg/dL — ABNORMAL HIGH (ref <117)

## 2010-11-06 MED ORDER — CITALOPRAM HYDROBROMIDE 20 MG PO TABS: 20.0000 mg | ORAL_TABLET | Freq: Every day | ORAL | Status: DC

## 2010-11-06 MED ORDER — LEVOTHYROXINE SODIUM 25 MCG PO TABS: ORAL_TABLET | ORAL | Status: DC

## 2010-11-06 NOTE — Telephone Encounter (Signed)
Med resent as requested 

## 2010-11-08 LAB — URINE CULTURE: Colony Count: >=100000

## 2010-12-05 ENCOUNTER — Other Ambulatory Visit: Payer: Self-pay | Admitting: Family Medicine

## 2010-12-15 ENCOUNTER — Telehealth: Payer: Self-pay | Admitting: Family Medicine

## 2010-12-16 ENCOUNTER — Other Ambulatory Visit: Payer: Self-pay | Admitting: Family Medicine

## 2010-12-16 NOTE — Telephone Encounter (Signed)
Called patient, x 2 busy signal

## 2010-12-18 NOTE — Telephone Encounter (Signed)
Returned call, busy signal 

## 2010-12-19 ENCOUNTER — Encounter: Payer: Self-pay | Admitting: Family Medicine

## 2010-12-23 ENCOUNTER — Ambulatory Visit (INDEPENDENT_AMBULATORY_CARE_PROVIDER_SITE_OTHER): Payer: Medicare Other | Admitting: Family Medicine

## 2010-12-23 ENCOUNTER — Encounter: Payer: Self-pay | Admitting: Family Medicine

## 2010-12-23 VITALS — BP 114/72 | HR 70 | Resp 16 | Ht 64.5 in | Wt 219.4 lb

## 2010-12-23 DIAGNOSIS — E119 Type 2 diabetes mellitus without complications: Secondary | ICD-10-CM

## 2010-12-23 DIAGNOSIS — R5383 Other fatigue: Secondary | ICD-10-CM

## 2010-12-23 DIAGNOSIS — B356 Tinea cruris: Secondary | ICD-10-CM

## 2010-12-23 DIAGNOSIS — E785 Hyperlipidemia, unspecified: Secondary | ICD-10-CM

## 2010-12-23 DIAGNOSIS — N390 Urinary tract infection, site not specified: Secondary | ICD-10-CM

## 2010-12-23 DIAGNOSIS — A6004 Herpesviral vulvovaginitis: Secondary | ICD-10-CM

## 2010-12-23 DIAGNOSIS — N3000 Acute cystitis without hematuria: Secondary | ICD-10-CM

## 2010-12-23 DIAGNOSIS — E039 Hypothyroidism, unspecified: Secondary | ICD-10-CM

## 2010-12-23 DIAGNOSIS — R5381 Other malaise: Secondary | ICD-10-CM

## 2010-12-23 DIAGNOSIS — I10 Essential (primary) hypertension: Secondary | ICD-10-CM

## 2010-12-23 DIAGNOSIS — C679 Malignant neoplasm of bladder, unspecified: Secondary | ICD-10-CM

## 2010-12-23 DIAGNOSIS — N760 Acute vaginitis: Secondary | ICD-10-CM | POA: Insufficient documentation

## 2010-12-23 LAB — POCT URINALYSIS DIPSTICK
Blood, UA: NEGATIVE
Glucose, UA: NEGATIVE
Leukocytes, UA: NEGATIVE
Nitrite, UA: POSITIVE
Protein, UA: NEGATIVE
Spec Grav, UA: 1.025
Urobilinogen, UA: 1
pH, UA: 5

## 2010-12-23 MED ORDER — LEVOTHYROXINE SODIUM 25 MCG PO TABS: ORAL_TABLET | ORAL | Status: DC

## 2010-12-23 MED ORDER — CLOTRIMAZOLE 1 % EX CREA: TOPICAL_CREAM | Freq: Two times a day (BID) | CUTANEOUS | Status: DC

## 2010-12-23 MED ORDER — CIPROFLOXACIN HCL 500 MG PO TABS: 500.0000 mg | ORAL_TABLET | Freq: Two times a day (BID) | ORAL | Status: AC

## 2010-12-23 MED ORDER — CLONIDINE HCL 0.2 MG PO TABS: 0.2000 mg | ORAL_TABLET | Freq: Every day | ORAL | Status: DC

## 2010-12-23 MED ORDER — METOPROLOL SUCCINATE ER 50 MG PO TB24: ORAL_TABLET | ORAL | Status: DC

## 2010-12-23 MED ORDER — CITALOPRAM HYDROBROMIDE 20 MG PO TABS: 20.0000 mg | ORAL_TABLET | Freq: Every day | ORAL | Status: DC

## 2010-12-23 NOTE — Patient Instructions (Addendum)
F/u in end  December.  You will be referred to urologist in [place], and gynecologist in [place].  We will obtain the date for your mammogram, and let you know  Your urine seems to be infected, you will start treatment for this.  REPLENS is recommended for intravaginal use for dryness, this is over the counter, stop vagisil Cream is prescribed for use in the groin area to the rash, keep area dry please  hSV2 today.  HBA1C and chem 7, and tSH in December non fasting before next visit

## 2010-12-23 NOTE — Telephone Encounter (Signed)
CALLED, BUSY SIGNAL

## 2010-12-24 LAB — GC/CHLAMYDIA PROBE AMP, GENITAL
Chlamydia, DNA Probe: NEGATIVE
GC Probe Amp, Genital: NEGATIVE

## 2010-12-24 LAB — WET PREP BY MOLECULAR PROBE
Candida species: POSITIVE — AB
Gardnerella vaginalis: NEGATIVE
Trichomonas vaginosis: NEGATIVE

## 2010-12-24 LAB — MICROALBUMIN / CREATININE URINE RATIO
Creatinine, Urine: 266.4 mg/dL
Microalb Creat Ratio: 3.5 mg/g (ref 0.0–30.0)
Microalb, Ur: 0.94 mg/dL (ref 0.00–1.89)

## 2010-12-24 MED ORDER — FLUCONAZOLE 150 MG PO TABS: 150.0000 mg | ORAL_TABLET | Freq: Once | ORAL | Status: AC

## 2010-12-24 MED ORDER — FLUCONAZOLE 150 MG PO TABS: 150.0000 mg | ORAL_TABLET | Freq: Once | ORAL | Status: DC

## 2010-12-25 LAB — URINE CULTURE
Colony Count: NO GROWTH
Organism ID, Bacteria: NO GROWTH

## 2010-12-26 DIAGNOSIS — A6004 Herpesviral vulvovaginitis: Secondary | ICD-10-CM | POA: Insufficient documentation

## 2010-12-26 LAB — HSV 2 ANTIBODY, IGG: HSV 2 Glycoprotein G Ab, IgG: 4.52 IV — ABNORMAL HIGH

## 2010-12-26 MED ORDER — ACYCLOVIR 800 MG PO TABS: ORAL_TABLET | ORAL | Status: DC

## 2010-12-26 NOTE — Assessment & Plan Note (Signed)
New dx

## 2010-12-28 NOTE — Assessment & Plan Note (Signed)
Topical antifungal prescribed, and counseled to keep area dry

## 2010-12-28 NOTE — Assessment & Plan Note (Signed)
Controlled, no change in medication  

## 2010-12-28 NOTE — Assessment & Plan Note (Signed)
Symptomatic, with abn uA, antibiotic prescribed

## 2010-12-28 NOTE — Assessment & Plan Note (Signed)
H/o bladder cancer, wants to establish with local urologist for surveillance, also has recurrent uTI symptoms

## 2010-12-28 NOTE — Assessment & Plan Note (Signed)
Pt symptomatic , will await results to treat definitively, recommend replens for vag dryness

## 2010-12-28 NOTE — Progress Notes (Signed)
  Subjective:    Patient ID: Linda Richardson, female    DOB: 11-07-1923, 77 y.o.   MRN: [id number]  HPI C/o persistent vaginal itch as well as itch in the groin, with rash. Concerned about recurrent/persistent uTI. Denies fever or chills or significant vaginal discharge.Not currently sexually active   Review of Systems Denies recent fever or chills. Denies sinus pressure, nasal congestion, ear pain or sore throat. Denies chest congestion, productive cough or wheezing. Denies chest pains, palpitations and leg swelling Denies abdominal pain, nausea, vomiting,diarrhea or constipation.   c/o dysuria and  frequency, denies  incontinence. Denies joint pain, swelling and limitation in mobility. Denies headaches, seizures, numbness, or tingling. Denies depression, anxiety or insomnia. Denies polyuria, polydipsia or hypoglycemic episodes        Objective:   Physical Exam Patient alert and oriented and in no cardiopulmonary distress.  HEENT: No facial asymmetry, EOMI, no sinus tenderness,  oropharynx pink and moist.  Neck supple no adenopathy.  Chest: Clear to auscultation bilaterally.  CVS: S1, S2 no murmurs, no S3.  ABD: Soft non tender. Bowel sounds normal. Genital: vaginal atrophy, white discharge, no ulceration Ext: No edema  MS: Adequate ROM spine, shoulders, hips and knees.  Skin: Intact, tinea cruris  Psych: Good eye contact, normal affect. Memory intact not anxious or depressed appearing.  CNS: CN 2-12 intact, power, tone and sensation normal throughout.        Assessment & Plan:

## 2010-12-30 ENCOUNTER — Ambulatory Visit (INDEPENDENT_AMBULATORY_CARE_PROVIDER_SITE_OTHER): Payer: Medicare Other | Admitting: Urology

## 2010-12-30 DIAGNOSIS — C679 Malignant neoplasm of bladder, unspecified: Secondary | ICD-10-CM

## 2010-12-30 DIAGNOSIS — N952 Postmenopausal atrophic vaginitis: Secondary | ICD-10-CM

## 2011-01-05 ENCOUNTER — Ambulatory Visit: Payer: Medicare Other | Admitting: Family Medicine

## 2011-01-23 ENCOUNTER — Ambulatory Visit (INDEPENDENT_AMBULATORY_CARE_PROVIDER_SITE_OTHER): Payer: Medicare Other | Admitting: Urology

## 2011-01-23 ENCOUNTER — Other Ambulatory Visit: Payer: Self-pay | Admitting: Family Medicine

## 2011-01-23 DIAGNOSIS — N949 Unspecified condition associated with female genital organs and menstrual cycle: Secondary | ICD-10-CM

## 2011-01-23 DIAGNOSIS — R300 Dysuria: Secondary | ICD-10-CM

## 2011-01-23 DIAGNOSIS — N952 Postmenopausal atrophic vaginitis: Secondary | ICD-10-CM

## 2011-03-02 ENCOUNTER — Encounter: Payer: Medicare Other | Admitting: Family Medicine

## 2011-05-29 ENCOUNTER — Other Ambulatory Visit: Payer: Self-pay | Admitting: Family Medicine

## 2011-06-09 ENCOUNTER — Ambulatory Visit (INDEPENDENT_AMBULATORY_CARE_PROVIDER_SITE_OTHER): Payer: Medicare Other | Admitting: Urology

## 2011-06-09 DIAGNOSIS — N3000 Acute cystitis without hematuria: Secondary | ICD-10-CM

## 2011-06-09 DIAGNOSIS — C679 Malignant neoplasm of bladder, unspecified: Secondary | ICD-10-CM

## 2011-08-25 ENCOUNTER — Encounter: Payer: Self-pay | Admitting: Family Medicine

## 2011-08-25 ENCOUNTER — Ambulatory Visit (INDEPENDENT_AMBULATORY_CARE_PROVIDER_SITE_OTHER): Payer: Medicare Other | Admitting: Family Medicine

## 2011-08-25 VITALS — BP 180/110 | HR 96 | Resp 16 | Ht 64.5 in | Wt 221.0 lb

## 2011-08-25 DIAGNOSIS — E785 Hyperlipidemia, unspecified: Secondary | ICD-10-CM

## 2011-08-25 DIAGNOSIS — N3000 Acute cystitis without hematuria: Secondary | ICD-10-CM

## 2011-08-25 DIAGNOSIS — A6004 Herpesviral vulvovaginitis: Secondary | ICD-10-CM

## 2011-08-25 DIAGNOSIS — R5381 Other malaise: Secondary | ICD-10-CM

## 2011-08-25 DIAGNOSIS — B009 Herpesviral infection, unspecified: Secondary | ICD-10-CM | POA: Insufficient documentation

## 2011-08-25 DIAGNOSIS — R5383 Other fatigue: Secondary | ICD-10-CM

## 2011-08-25 DIAGNOSIS — I10 Essential (primary) hypertension: Secondary | ICD-10-CM

## 2011-08-25 DIAGNOSIS — E039 Hypothyroidism, unspecified: Secondary | ICD-10-CM

## 2011-08-25 DIAGNOSIS — E119 Type 2 diabetes mellitus without complications: Secondary | ICD-10-CM

## 2011-08-25 LAB — TSH: TSH: 1.965 u[IU]/mL (ref 0.350–4.500)

## 2011-08-25 NOTE — Patient Instructions (Addendum)
F/u in 2 month  Labs today.CBC, TSH, lipid, cmp and EGFR, HBA1C   We will test your urine today  New medication today for viral infection to help with itch.  Blood pressure is high , you need to take medication every day at the same time for your blood pressure it is high   Mammogram is past due you need to have this done, we will schedule on the way out

## 2011-08-25 NOTE — Progress Notes (Signed)
  Subjective:    Patient ID: Linda Richardson, female    DOB: 03-15-1924, 78 y.o.   MRN: [id number]  HPI The PT is here for follow up and re-evaluation of chronic medical conditions, medication management and review of any available recent lab and radiology data.  Preventive health is updated, specifically  Cancer screening and Immunization.   Questions or concerns regarding consultations or procedures which the PT has had in the interim are  Addressed.Recently seen by urology for UTI The PT denies any adverse reactions to current medications since the last visit. She has not taken he Bp med today and discontinued her diabetes medication for no reason. Has been gone for approx 10 months, states she is staying home more, gets "nervous" going out. C/o vaginal itch increased in past 2 weeks, has establishes HSV2, also c/o burning with urination Tearful when speaking of her daughter in [place] who remains "distant' emotionally and is mean to her Denies polyuria, polydipsia, blurred vision or fatigue    Review of Systems See HPI Denies recent fever or chills. Denies sinus pressure, nasal congestion, ear pain or sore throat. Denies chest congestion, productive cough or wheezing. Denies chest pains, palpitations and leg swelling Denies abdominal pain, nausea, vomiting,diarrhea or constipation.   Denies joint pain, swelling and limitation in mobility. Denies headaches, seizures, numbness, or tingling.  Denies skin break down or rash.        Objective:   Physical Exam  Patient alert and oriented and in no cardiopulmonary distress.  HEENT: No facial asymmetry, EOMI, no sinus tenderness,  oropharynx pink and moist.  Neck supple no adenopathy.  Chest: Clear to auscultation bilaterally.  CVS: S1, S2 no murmurs, no S3.  ABD: Soft non tender. Bowel sounds normal.  Ext: No edema  MS: Adequate though reduced  ROM spine, shoulders, hips and knees.  Skin: Intact, no ulcerations or rash  noted.  Psych: Good eye contact, normal affect. Memory intact not anxious or depressed appearing.  CNS: CN 2-12 intact, power, tone and sensation normal throughout.       Assessment & Plan:

## 2011-08-26 ENCOUNTER — Telehealth: Payer: Self-pay | Admitting: Family Medicine

## 2011-08-26 DIAGNOSIS — E119 Type 2 diabetes mellitus without complications: Secondary | ICD-10-CM

## 2011-08-26 LAB — LIPID PANEL
Cholesterol: 169 mg/dL (ref 0–200)
HDL: 51 mg/dL (ref >39)
LDL Cholesterol: 96 mg/dL (ref 0–99)
Total CHOL/HDL Ratio: 3.3 Ratio
Triglycerides: 111 mg/dL (ref <150)
VLDL: 22 mg/dL (ref 0–40)

## 2011-08-26 LAB — CBC WITH DIFFERENTIAL/PLATELET
Basophils Absolute: 0 10*3/uL (ref 0.0–0.1)
Basophils Relative: 0 % (ref 0–1)
Eosinophils Absolute: 0.1 10*3/uL (ref 0.0–0.7)
Eosinophils Relative: 2 % (ref 0–5)
HCT: 37.2 % (ref 36.0–46.0)
Hemoglobin: 12.9 g/dL (ref 12.0–15.0)
Lymphocytes Relative: 30 % (ref 12–46)
Lymphs Abs: 1.3 10*3/uL (ref 0.7–4.0)
MCH: 31.4 pg (ref 26.0–34.0)
MCHC: 34.7 g/dL (ref 30.0–36.0)
MCV: 90.5 fL (ref 78.0–100.0)
Monocytes Absolute: 0.5 10*3/uL (ref 0.1–1.0)
Monocytes Relative: 10 % (ref 3–12)
Neutro Abs: 2.6 10*3/uL (ref 1.7–7.7)
Neutrophils Relative %: 58 % (ref 43–77)
Platelets: 171 10*3/uL (ref 150–400)
RBC: 4.11 MIL/uL (ref 3.87–5.11)
RDW: 14.8 % (ref 11.5–15.5)
WBC: 4.5 10*3/uL (ref 4.0–10.5)

## 2011-08-26 LAB — COMPLETE METABOLIC PANEL WITH GFR
ALT: 29 U/L (ref 0–35)
AST: 30 U/L (ref 0–37)
Albumin: 4.3 g/dL (ref 3.5–5.2)
Alkaline Phosphatase: 46 U/L (ref 39–117)
BUN: 14 mg/dL (ref 6–23)
CO2: 23 mEq/L (ref 19–32)
Calcium: 9 mg/dL (ref 8.4–10.5)
Chloride: 108 mEq/L (ref 96–112)
Creat: 1.16 mg/dL — ABNORMAL HIGH (ref 0.50–1.10)
GFR, Est African American: 50 mL/min — ABNORMAL LOW
GFR, Est Non African American: 43 mL/min — ABNORMAL LOW
Glucose, Bld: 103 mg/dL — ABNORMAL HIGH (ref 70–99)
Potassium: 3.9 mEq/L (ref 3.5–5.3)
Sodium: 140 mEq/L (ref 135–145)
Total Bilirubin: 0.4 mg/dL (ref 0.3–1.2)
Total Protein: 7.2 g/dL (ref 6.0–8.3)

## 2011-08-26 LAB — HEMOGLOBIN A1C
Hgb A1c MFr Bld: 7.8 % — ABNORMAL HIGH (ref <5.7)
Mean Plasma Glucose: 177 mg/dL — ABNORMAL HIGH (ref <117)

## 2011-08-26 MED ORDER — METFORMIN HCL 500 MG PO TABS: 500.0000 mg | ORAL_TABLET | Freq: Two times a day (BID) | ORAL | Status: DC

## 2011-08-26 MED ORDER — ACYCLOVIR 400 MG PO TABS: 400.0000 mg | ORAL_TABLET | Freq: Three times a day (TID) | ORAL | Status: AC

## 2011-08-26 NOTE — Assessment & Plan Note (Signed)
Controlled, no change in medication  

## 2011-08-26 NOTE — Assessment & Plan Note (Signed)
Uncontrolled has not taken medication today

## 2011-08-26 NOTE — Assessment & Plan Note (Signed)
Uncontrolled, on no medication

## 2011-08-26 NOTE — Assessment & Plan Note (Signed)
Urine to be checked in office if abnormal will send for culture

## 2011-08-26 NOTE — Telephone Encounter (Signed)
Waiting on sensitivity was recently on antibiotic. Acyclovir sent for HSV2 pain

## 2011-08-26 NOTE — Telephone Encounter (Signed)
Did you mean to send her in something for UTI?

## 2011-08-27 ENCOUNTER — Other Ambulatory Visit: Payer: Self-pay | Admitting: Family Medicine

## 2011-08-27 NOTE — Telephone Encounter (Signed)
Pt aware and will await Culture. Wants all meds now on sent to [facility]. Changed in the computer

## 2011-08-28 ENCOUNTER — Other Ambulatory Visit: Payer: Self-pay

## 2011-08-28 DIAGNOSIS — E119 Type 2 diabetes mellitus without complications: Secondary | ICD-10-CM

## 2011-08-28 MED ORDER — METFORMIN HCL 500 MG PO TABS: 500.0000 mg | ORAL_TABLET | Freq: Two times a day (BID) | ORAL | Status: DC

## 2011-09-03 ENCOUNTER — Other Ambulatory Visit: Payer: Self-pay

## 2011-10-09 ENCOUNTER — Telehealth: Payer: Self-pay | Admitting: Family Medicine

## 2011-10-09 NOTE — Telephone Encounter (Signed)
Will you order this without office visit?

## 2011-10-29 ENCOUNTER — Ambulatory Visit (INDEPENDENT_AMBULATORY_CARE_PROVIDER_SITE_OTHER): Payer: Medicare Other | Admitting: Family Medicine

## 2011-10-29 ENCOUNTER — Encounter: Payer: Self-pay | Admitting: Family Medicine

## 2011-10-29 VITALS — BP 112/70 | HR 69 | Resp 18 | Ht 64.5 in | Wt 217.1 lb

## 2011-10-29 DIAGNOSIS — F329 Major depressive disorder, single episode, unspecified: Secondary | ICD-10-CM

## 2011-10-29 DIAGNOSIS — F3289 Other specified depressive episodes: Secondary | ICD-10-CM

## 2011-10-29 DIAGNOSIS — E039 Hypothyroidism, unspecified: Secondary | ICD-10-CM

## 2011-10-29 DIAGNOSIS — I10 Essential (primary) hypertension: Secondary | ICD-10-CM

## 2011-10-29 DIAGNOSIS — E119 Type 2 diabetes mellitus without complications: Secondary | ICD-10-CM

## 2011-10-29 DIAGNOSIS — Z1213 Encounter for screening for malignant neoplasm of small intestine: Secondary | ICD-10-CM

## 2011-10-29 DIAGNOSIS — J42 Unspecified chronic bronchitis: Secondary | ICD-10-CM

## 2011-10-29 DIAGNOSIS — E785 Hyperlipidemia, unspecified: Secondary | ICD-10-CM

## 2011-10-29 MED ORDER — LEVOFLOXACIN 500 MG PO TABS: 500.0000 mg | ORAL_TABLET | Freq: Every day | ORAL | Status: AC

## 2011-10-29 MED ORDER — LEVOTHYROXINE SODIUM 25 MCG PO TABS: 25.0000 ug | ORAL_TABLET | Freq: Every day | ORAL | Status: DC

## 2011-10-29 MED ORDER — CLONIDINE HCL 0.2 MG PO TABS: 0.2000 mg | ORAL_TABLET | Freq: Every day | ORAL | Status: DC

## 2011-10-29 MED ORDER — METOPROLOL SUCCINATE ER 50 MG PO TB24: 50.0000 mg | ORAL_TABLET | Freq: Every day | ORAL | Status: DC

## 2011-10-29 MED ORDER — BENZONATATE 100 MG PO CAPS: 100.0000 mg | ORAL_CAPSULE | Freq: Four times a day (QID) | ORAL | Status: DC | PRN

## 2011-10-29 NOTE — Patient Instructions (Addendum)
F/U 3rd week in September.Call if you need me before  HBA1C and chem 7 and microalb mid September  Please ensure you get your mammogram    You are being treated for  Bronchitis, 2 medications are sent in  Please keep active and eat healthily, a lot of vegetables, and some fruit, so that you continue to do well

## 2011-10-29 NOTE — Assessment & Plan Note (Signed)
Recta exam revealed no mass and stool ios guaiac negative

## 2011-11-01 NOTE — Progress Notes (Signed)
  Subjective:    Patient ID: Linda Richardson, female    DOB: 08/01/1923, 78 y.o.   MRN: [id number]  HPI The PT is here for follow up and re-evaluation of chronic medical conditions, medication management and review of any available recent lab and radiology data.  Preventive health is updated, specifically  Cancer screening and Immunization.   Questions or concerns regarding consultations or procedures which the PT has had in the interim are  addressed. The PT denies any adverse reactions to current medications since the last visit.  3 week h/o chest congestion with cough productive of thick cream sputum, denies associated fever has had  Chills. Ms Mikal Plane recently discontinued metformin and has not been testing her blood sugar states she "feels well"     Review of Systems See HPI Denies recent fever or chills. Denies sinus pressure, nasal congestion, ear pain or sore throat. Denies chest pains, palpitations and leg swelling Denies abdominal pain, nausea, vomiting,diarrhea or constipation.   Denies dysuria, frequency, hesitancy or incontinence. C/o  joint pain, and limitation in mobility.denies falls or gait instabilty Denies headaches, seizures, numbness, or tingling. Denies depression, anxiety or insomnia. Denies skin break down or rash.        Objective:   Physical Exam Patient alert and oriented and in no cardiopulmonary distress.  HEENT: No facial asymmetry, EOMI, no sinus tenderness,  oropharynx pink and moist.  Neck supple no adenopathy.  Chest: decreased air entry, though adequate, scattered crackles, no wheezes  CVS: S1, S2 no murmurs, no S3.  ABD: Soft non tender. Bowel sounds normal.  Ext: No edema  MS: decreased  ROM spine, shoulders, hips and knees.  Skin: Intact, no ulcerations or rash noted.  Psych: Good eye contact, normal   . Memory intact not anxious or depressed appearing.  CNS: CN 2-12 intact, power, tone and sensation normal throughout.        Assessment & Plan:

## 2011-11-01 NOTE — Assessment & Plan Note (Signed)
Controlled, no change in medication  

## 2011-11-01 NOTE — Assessment & Plan Note (Signed)
Decongestant and antibiotics prescribed 

## 2011-11-01 NOTE — Assessment & Plan Note (Signed)
Hyperlipidemia:Low fat diet discussed and encouraged.  Controlled, no change in medication   

## 2011-11-01 NOTE — Assessment & Plan Note (Signed)
Deteriorated, the importance of medication compliance stressed as well as adherence to diabetic diet

## 2011-11-10 ENCOUNTER — Other Ambulatory Visit: Payer: Self-pay | Admitting: Family Medicine

## 2011-12-14 ENCOUNTER — Other Ambulatory Visit: Payer: Self-pay | Admitting: Family Medicine

## 2012-01-05 ENCOUNTER — Ambulatory Visit: Payer: Medicare Other | Admitting: Family Medicine

## 2012-01-08 ENCOUNTER — Other Ambulatory Visit: Payer: Self-pay | Admitting: Family Medicine

## 2012-01-11 ENCOUNTER — Encounter: Payer: Self-pay | Admitting: Family Medicine

## 2012-01-13 ENCOUNTER — Ambulatory Visit (INDEPENDENT_AMBULATORY_CARE_PROVIDER_SITE_OTHER): Payer: Medicare Other | Admitting: Family Medicine

## 2012-01-13 ENCOUNTER — Encounter: Payer: Self-pay | Admitting: Family Medicine

## 2012-01-13 VITALS — BP 140/80 | HR 98 | Resp 16 | Ht 64.5 in | Wt 213.0 lb

## 2012-01-13 DIAGNOSIS — E039 Hypothyroidism, unspecified: Secondary | ICD-10-CM

## 2012-01-13 DIAGNOSIS — I10 Essential (primary) hypertension: Secondary | ICD-10-CM

## 2012-01-13 DIAGNOSIS — N3000 Acute cystitis without hematuria: Secondary | ICD-10-CM

## 2012-01-13 DIAGNOSIS — E669 Obesity, unspecified: Secondary | ICD-10-CM

## 2012-01-13 DIAGNOSIS — F329 Major depressive disorder, single episode, unspecified: Secondary | ICD-10-CM

## 2012-01-13 DIAGNOSIS — F3289 Other specified depressive episodes: Secondary | ICD-10-CM

## 2012-01-13 DIAGNOSIS — E119 Type 2 diabetes mellitus without complications: Secondary | ICD-10-CM

## 2012-01-13 DIAGNOSIS — E785 Hyperlipidemia, unspecified: Secondary | ICD-10-CM

## 2012-01-13 LAB — HEMOGLOBIN A1C
Hgb A1c MFr Bld: 6.3 % — ABNORMAL HIGH (ref <5.7)
Mean Plasma Glucose: 134 mg/dL — ABNORMAL HIGH (ref <117)

## 2012-01-13 LAB — BASIC METABOLIC PANEL
BUN: 19 mg/dL (ref 6–23)
CO2: 23 mEq/L (ref 19–32)
Calcium: 9.1 mg/dL (ref 8.4–10.5)
Chloride: 108 mEq/L (ref 96–112)
Creat: 1.29 mg/dL — ABNORMAL HIGH (ref 0.50–1.10)
Glucose, Bld: 106 mg/dL — ABNORMAL HIGH (ref 70–99)
Potassium: 4 mEq/L (ref 3.5–5.3)
Sodium: 140 mEq/L (ref 135–145)

## 2012-01-13 MED ORDER — CITALOPRAM HYDROBROMIDE 20 MG PO TABS: ORAL_TABLET | ORAL | Status: DC

## 2012-01-13 MED ORDER — LEVOTHYROXINE SODIUM 25 MCG PO TABS: ORAL_TABLET | ORAL | Status: DC

## 2012-01-13 MED ORDER — SIMVASTATIN 40 MG PO TABS: ORAL_TABLET | ORAL | Status: DC

## 2012-01-13 MED ORDER — CLONIDINE HCL 0.2 MG PO TABS: 0.2000 mg | ORAL_TABLET | Freq: Every day | ORAL | Status: DC

## 2012-01-13 MED ORDER — IRBESARTAN-HYDROCHLOROTHIAZIDE 150-12.5 MG PO TABS: 1.0000 | ORAL_TABLET | Freq: Two times a day (BID) | ORAL | Status: DC

## 2012-01-13 MED ORDER — METFORMIN HCL 500 MG PO TABS: 500.0000 mg | ORAL_TABLET | Freq: Two times a day (BID) | ORAL | Status: DC

## 2012-01-13 MED ORDER — METOPROLOL SUCCINATE ER 50 MG PO TB24: 50.0000 mg | ORAL_TABLET | Freq: Every day | ORAL | Status: DC

## 2012-01-13 NOTE — Progress Notes (Signed)
  Subjective:    Patient ID: Linda Richardson, female    DOB: [birth year], 78 y.o.   MRN: [id number]  HPI The PT is here for follow up and re-evaluation of chronic medical conditions, medication management and review of any available recent lab and radiology data.  Preventive health is updated, specifically  Cancer screening and Immunization.   Questions or concerns regarding consultations or procedures which the PT has had in the interim are  addressed. The PT denies any adverse reactions to current medications since the last visit.  C/o mild burning, and stinging in vaginal area, has not taken acyclovir as prescribed and has HSV2. Denies fever, chills or dysuria, or frequency. Denies polyuria, polydipsia or blurred vision.  Up to date with eye and foot care     Review of Systems See HPI Denies recent fever or chills. Denies sinus pressure, nasal congestion, ear pain or sore throat. Denies chest congestion, productive cough or wheezing. Denies chest pains, palpitations and leg swelling Denies abdominal pain, nausea, vomiting,diarrhea or constipation.   Denies dysuria, frequency, hesitancy or incontinence. Chronic  joint pain,  and limitation in mobility. Denies headaches, seizures, numbness, or tingling. Denies uncontrolled  depression, anxiety or insomnia. Denies skin break down or rash.        Objective:   Physical Exam Patient alert and oriented and in no cardiopulmonary distress.  HEENT: No facial asymmetry, EOMI, no sinus tenderness,  oropharynx pink and moist.  Neck supple no adenopathy.  Chest: Clear to auscultation bilaterally.  CVS: S1, S2 no murmurs, no S3.  ABD: Soft non tender. Bowel sounds normal.  Ext: No edema  MS: decreased  ROM spine, shoulders, hips and knees.  Skin: Intact, no ulcerations or rash noted.  Psych: Good eye contact, normal affect. Memory mildly decreased ,mildly  anxious not  depressed appearing.  CNS: CN 2-12 intact, power,  normal  throughout. Diabetic Foot Check:  Appearance - no lesions, ulcers , has  calluses Skin - no unusual pallor or redness Sensation - grossly intact to light touch Monofilament testing -  Right - Great toe, medial, central, lateral ball and posterior foot diminished Left - Great toe, medial, central, lateral ball and posterior foot diminished Pulses Left - Dorsalis Pedis and Posterior Tibia normal Right - Dorsalis Pedis and Posterior Tibia normal         Assessment & Plan:

## 2012-01-13 NOTE — Patient Instructions (Addendum)
Annual wellness  January, call if you need me before  Reconsider the flu vaccine please  I  am thankful that you are doing better   HBA1C, tSH, chem 7

## 2012-01-14 ENCOUNTER — Telehealth: Payer: Self-pay | Admitting: Family Medicine

## 2012-01-14 LAB — MICROALBUMIN / CREATININE URINE RATIO
Creatinine, Urine: 222.3 mg/dL
Microalb Creat Ratio: 4.6 mg/g (ref 0.0–30.0)
Microalb, Ur: 1.03 mg/dL (ref 0.00–1.89)

## 2012-01-14 MED ORDER — CIPROFLOXACIN HCL 500 MG PO TABS: 500.0000 mg | ORAL_TABLET | Freq: Two times a day (BID) | ORAL | Status: AC

## 2012-01-14 NOTE — Telephone Encounter (Signed)
See result niote, response is there as well please

## 2012-01-14 NOTE — Telephone Encounter (Signed)
Please advise 

## 2012-01-17 NOTE — Assessment & Plan Note (Signed)
Controlled, no change in medication Hyperlipidemia:Low fat diet discussed and encouraged.  \ 

## 2012-01-17 NOTE — Assessment & Plan Note (Signed)
Controlled, no change in medication  

## 2012-01-17 NOTE — Assessment & Plan Note (Signed)
Improved, though still not at goal, no med change at this time  DASH diet and commitment to daily physical activity for a minimum of 30 minutes discussed and encouraged, as a part of hypertension management. The importance of attaining a healthy weight is also discussed.

## 2012-01-17 NOTE — Assessment & Plan Note (Signed)
Improved and controlled on medication.  Patient advised to reduce carb and sweets, commit to regular physical activity, take meds as prescribed, test blood as directed, and attempt to lose weight, to improve blood sugar control.

## 2012-01-17 NOTE — Assessment & Plan Note (Signed)
Unchanged. Patient re-educated about  the importance of commitment to a  minimum of 150 minutes of exercise per week. The importance of healthy food choices with portion control discussed. Encouraged to start a food diary, count calories and to consider  joining a support group. Sample diet sheets offered. Goals set by the patient for the next several months.    

## 2012-01-18 ENCOUNTER — Telehealth: Payer: Self-pay | Admitting: Family Medicine

## 2012-01-18 NOTE — Telephone Encounter (Signed)
Pt aware.

## 2012-01-19 ENCOUNTER — Encounter: Payer: Self-pay | Admitting: Family Medicine

## 2012-01-19 ENCOUNTER — Ambulatory Visit (INDEPENDENT_AMBULATORY_CARE_PROVIDER_SITE_OTHER): Payer: Medicare Other | Admitting: Family Medicine

## 2012-01-19 VITALS — BP 112/70 | HR 70 | Resp 18 | Ht 64.5 in | Wt 211.0 lb

## 2012-01-19 DIAGNOSIS — E785 Hyperlipidemia, unspecified: Secondary | ICD-10-CM

## 2012-01-19 DIAGNOSIS — I10 Essential (primary) hypertension: Secondary | ICD-10-CM

## 2012-01-19 DIAGNOSIS — N3000 Acute cystitis without hematuria: Secondary | ICD-10-CM

## 2012-01-19 DIAGNOSIS — E119 Type 2 diabetes mellitus without complications: Secondary | ICD-10-CM

## 2012-01-19 LAB — POCT URINALYSIS DIPSTICK
Bilirubin, UA: NEGATIVE
Glucose, UA: NEGATIVE
Ketones, UA: NEGATIVE
Nitrite, UA: NEGATIVE
Protein, UA: NEGATIVE
Spec Grav, UA: >=1.03
Urobilinogen, UA: 0.2
pH, UA: 5.5

## 2012-01-19 NOTE — Telephone Encounter (Signed)
Patient scheduled appointment.

## 2012-01-19 NOTE — Patient Instructions (Addendum)
F/u as before.  Urine is being tested and will be sent if needed   Consider flu vaccine today

## 2012-01-21 LAB — URINE CULTURE
Colony Count: NO GROWTH
Organism ID, Bacteria: NO GROWTH

## 2012-01-25 ENCOUNTER — Other Ambulatory Visit: Payer: Self-pay | Admitting: Family Medicine

## 2012-01-26 NOTE — Assessment & Plan Note (Signed)
Controlled, no change in medication DASH diet and commitment to daily physical activity for a minimum of 30 minutes discussed and encouraged, as a part of hypertension management. The importance of attaining a healthy weight is also discussed.  
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
## 2012-01-26 NOTE — Assessment & Plan Note (Signed)
ccua in offfice abn, will wait on c/s to treat if needed. I believe cystitis symptoms are mainly due to HSV2

## 2012-01-26 NOTE — Assessment & Plan Note (Signed)
Controlled, no change in medication Patient advised to reduce carb and sweets, commit to regular physical activity, take meds as prescribed, test blood as directed, and attempt to lose weight, to improve blood sugar control.  

## 2012-01-26 NOTE — Assessment & Plan Note (Signed)
Hyperlipidemia:Low fat diet discussed and encouraged.  Updated lab needed 

## 2012-03-14 ENCOUNTER — Other Ambulatory Visit: Payer: Self-pay | Admitting: Family Medicine

## 2012-04-08 ENCOUNTER — Other Ambulatory Visit: Payer: Self-pay | Admitting: Family Medicine

## 2012-05-10 ENCOUNTER — Encounter: Payer: Self-pay | Admitting: Family Medicine

## 2012-05-10 ENCOUNTER — Ambulatory Visit (INDEPENDENT_AMBULATORY_CARE_PROVIDER_SITE_OTHER): Payer: Medicare Other | Admitting: Family Medicine

## 2012-05-10 VITALS — BP 130/80 | HR 75 | Resp 16 | Ht 64.5 in | Wt 213.0 lb

## 2012-05-10 DIAGNOSIS — E119 Type 2 diabetes mellitus without complications: Secondary | ICD-10-CM

## 2012-05-10 DIAGNOSIS — M25559 Pain in unspecified hip: Secondary | ICD-10-CM

## 2012-05-10 DIAGNOSIS — M899 Disorder of bone, unspecified: Secondary | ICD-10-CM

## 2012-05-10 DIAGNOSIS — Z0000 Encounter for general adult medical examination without abnormal findings: Secondary | ICD-10-CM | POA: Insufficient documentation

## 2012-05-10 DIAGNOSIS — Z8639 Personal history of other endocrine, nutritional and metabolic disease: Secondary | ICD-10-CM

## 2012-05-10 DIAGNOSIS — J209 Acute bronchitis, unspecified: Secondary | ICD-10-CM

## 2012-05-10 DIAGNOSIS — E785 Hyperlipidemia, unspecified: Secondary | ICD-10-CM

## 2012-05-10 DIAGNOSIS — E039 Hypothyroidism, unspecified: Secondary | ICD-10-CM

## 2012-05-10 DIAGNOSIS — H9190 Unspecified hearing loss, unspecified ear: Secondary | ICD-10-CM

## 2012-05-10 DIAGNOSIS — Z862 Personal history of diseases of the blood and blood-forming organs and certain disorders involving the immune mechanism: Secondary | ICD-10-CM

## 2012-05-10 DIAGNOSIS — M25551 Pain in right hip: Secondary | ICD-10-CM

## 2012-05-10 LAB — COMPLETE METABOLIC PANEL WITH GFR
ALT: 14 U/L (ref 0–35)
AST: 16 U/L (ref 0–37)
Albumin: 3.7 g/dL (ref 3.5–5.2)
Alkaline Phosphatase: 50 U/L (ref 39–117)
BUN: 18 mg/dL (ref 6–23)
CO2: 22 mEq/L (ref 19–32)
Calcium: 8.8 mg/dL (ref 8.4–10.5)
Chloride: 109 mEq/L (ref 96–112)
Creat: 1.24 mg/dL — ABNORMAL HIGH (ref 0.50–1.10)
GFR, Est African American: 45 mL/min — ABNORMAL LOW
GFR, Est Non African American: 39 mL/min — ABNORMAL LOW
Glucose, Bld: 107 mg/dL — ABNORMAL HIGH (ref 70–99)
Potassium: 4.5 mEq/L (ref 3.5–5.3)
Sodium: 140 mEq/L (ref 135–145)
Total Bilirubin: 0.3 mg/dL (ref 0.3–1.2)
Total Protein: 6.7 g/dL (ref 6.0–8.3)

## 2012-05-10 LAB — LIPID PANEL
Cholesterol: 141 mg/dL (ref 0–200)
HDL: 43 mg/dL (ref >39)
LDL Cholesterol: 67 mg/dL (ref 0–99)
Total CHOL/HDL Ratio: 3.3 Ratio
Triglycerides: 157 mg/dL — ABNORMAL HIGH (ref <150)
VLDL: 31 mg/dL (ref 0–40)

## 2012-05-10 LAB — TSH: TSH: 4.133 u[IU]/mL (ref 0.350–4.500)

## 2012-05-10 LAB — HEMOGLOBIN A1C
Hgb A1c MFr Bld: 6.3 % — ABNORMAL HIGH (ref <5.7)
Mean Plasma Glucose: 134 mg/dL — ABNORMAL HIGH (ref <117)

## 2012-05-10 MED ORDER — DOXYCYCLINE HYCLATE 100 MG PO TABS: 100.0000 mg | ORAL_TABLET | Freq: Two times a day (BID) | ORAL | Status: AC

## 2012-05-10 MED ORDER — BENZONATATE 100 MG PO CAPS: 100.0000 mg | ORAL_CAPSULE | Freq: Four times a day (QID) | ORAL | Status: DC | PRN

## 2012-05-10 NOTE — Assessment & Plan Note (Signed)
Annual wellness completed as documented. Referred for hearing eval Encouraged to commit to regular physical activity

## 2012-05-10 NOTE — Assessment & Plan Note (Signed)
Acute infection, antibiotics and decongestants prescribed 

## 2012-05-10 NOTE — Progress Notes (Signed)
Subjective:    Patient ID: Linda Richardson, female    DOB: June 08, 1923, 79 y.o.   MRN: [id number]  HPI  Preventive Screening-Counseling & Management   Patient present here today for a Medicare annual wellness visit.   Current Problems (verified)   Medications Prior to Visit Allergies (verified)   PAST HISTORY  Family History:One sibling only she is diabetic Social History Widow x 30 years, mother of 3 children 2 involved with her, son aged 76 shot by his mistress No current  Alcohol, nicotine, tobacco use quit x 50 years   Risk Factors  Current exercise habits:  5 days per week , on avg 10 to 15 mins Dietary issues discussed:yes, low fat  And low carb diet   Cardiac risk factors:   Depression Screen  (Note: if answer to either of the following is "Yes", a more complete depression screening is indicated)   Over the past two weeks, have you felt down, depressed or hopeless? No  Over the past two weeks, have you felt little interest or pleasure in doing things? No  Have you lost interest or pleasure in daily life? No  Do you often feel hopeless? No  Do you cry easily over simple problems? No   Activities of Daily Living  In your present state of health, do you have any difficulty performing the following activities?  Driving?: yes stopped driving since 4098 Managing money?: No Feeding yourself?:No Getting from bed to chair?:No Climbing a flight of stairs?:yes Preparing food and eating?:No Bathing or showering?:No Getting dressed?:No Getting to the toilet?:No Using the toilet?:No Moving around from place to place?: if moves fast will lose her balance but is slowing down  Fall Risk Assessment In the past year have you fallen or had a near fall?:yes, loses balance when moves fast, fell twice was standing drying her toes Are you currently taking any medications that make you dizziness?:No, blood pressure med makes her light headed, in the morning for approx 2  hours   Hearing Difficulties: No Do you often ask people to speak up or repeat themselves?:No Do you experience ringing or noises in your ears?:No Do you have difficulty understanding soft or whispered voices?:No  Cognitive Testing  Alert? Yes Normal Appearance?Yes  Oriented to person? Yes Place? Yes  Time? Yes  Displays appropriate judgment?Yes  Can read the correct time from a watch face? yes Are you having problems remembering things?sometimes  Advanced Directives have been discussed with the patient?Yes    List the Names of Other Physician/Practitioners you currently use: Dr Hilda Lias , Dr Mayford Knife, Dr Kennis Carina any recent Medical Services you may have received from other than [facility] providers in the past year (date may be approximate).   Assessment:    Annual Wellness Exam   Plan:    During the course of the visit the patient was educated and counseled about appropriate screening and preventive services including:  A healthy diet is rich in fruit, vegetables and whole grains. Poultry fish, nuts and beans are a healthy choice for protein rather then red meat. A low sodium diet and drinking 64 ounces of water daily is generally recommended. Oils and sweet should be limited. Carbohydrates especially for those who are diabetic or overweight, should be limited to 30-45 gram per meal. It is important to eat on a regular schedule, at least 3 times daily. Snacks should be primarily fruits, vegetables or nuts. It is important that you exercise regularly at least 30 minutes 5  times a week. If you develop chest pain, have severe difficulty breathing, or feel very tired, stop exercising immediately and seek medical attention  Immunization reviewed and updated. Cancer screening reviewed and updated    Patient Instructions (the written plan) was given to the patient.  Medicare Attestation  I have personally reviewed:  The patient's medical and social history  Their use of alcohol,  tobacco or illicit drugs  Their current medications and supplements  The patient's functional ability including ADLs,fall risks, home safety risks, cognitive, and hearing and visual impairment  Diet and physical activities  Evidence for depression or mood disorders  The patient's weight, height, BMI, and visual acuity have been recorded in the chart. I have made referrals, counseling, and provided education to the patient based on review of the above and I have provided the patient with a written personalized care plan for preventive services.     Review of Systems     Objective:   Physical Exam        Assessment & Plan:

## 2012-05-10 NOTE — Patient Instructions (Addendum)
F/u in 4 month, please call if you need me before.  You are treated for acute bronchitis.  No med changes today.  Labs today, lipid, cmp and EGFr, HBA1C, TSH, VitD  You are referred for hearing evaluation.  Please start crosswords, word searches etc  To help your memory  Be careful, slow down , so you do not fall  Commit to daily exercise for 30 minutes total please  Please get the flu vaccine at your pharmacy in 2 weeks, once you are better

## 2012-05-11 ENCOUNTER — Other Ambulatory Visit: Payer: Self-pay

## 2012-05-11 ENCOUNTER — Other Ambulatory Visit: Payer: Self-pay | Admitting: Family Medicine

## 2012-05-11 LAB — VITAMIN D 25 HYDROXY (VIT D DEFICIENCY, FRACTURES): Vit D, 25-Hydroxy: 21 ng/mL — ABNORMAL LOW (ref 30–89)

## 2012-05-11 MED ORDER — ERGOCALCIFEROL 1.25 MG (50000 UT) PO CAPS: 50000.0000 [IU] | ORAL_CAPSULE | ORAL | Status: DC

## 2012-05-18 ENCOUNTER — Other Ambulatory Visit: Payer: Self-pay | Admitting: Family Medicine

## 2012-05-19 ENCOUNTER — Ambulatory Visit (INDEPENDENT_AMBULATORY_CARE_PROVIDER_SITE_OTHER): Payer: Medicare Other | Admitting: Otolaryngology

## 2012-05-26 ENCOUNTER — Other Ambulatory Visit: Payer: Self-pay | Admitting: Family Medicine

## 2012-08-12 ENCOUNTER — Other Ambulatory Visit: Payer: Self-pay | Admitting: Family Medicine

## 2012-08-23 ENCOUNTER — Other Ambulatory Visit: Payer: Self-pay | Admitting: Family Medicine

## 2012-09-05 ENCOUNTER — Ambulatory Visit: Payer: Medicare Other | Admitting: Family Medicine

## 2012-09-07 ENCOUNTER — Ambulatory Visit: Payer: Medicare Other | Admitting: Family Medicine

## 2012-09-24 ENCOUNTER — Other Ambulatory Visit: Payer: Self-pay | Admitting: Family Medicine

## 2012-10-22 ENCOUNTER — Other Ambulatory Visit: Payer: Self-pay | Admitting: Family Medicine

## 2012-10-27 ENCOUNTER — Other Ambulatory Visit: Payer: Self-pay

## 2012-11-14 ENCOUNTER — Encounter: Payer: Self-pay | Admitting: Family Medicine

## 2012-11-14 ENCOUNTER — Ambulatory Visit (INDEPENDENT_AMBULATORY_CARE_PROVIDER_SITE_OTHER): Payer: Medicare Other | Admitting: Family Medicine

## 2012-11-14 ENCOUNTER — Ambulatory Visit (HOSPITAL_COMMUNITY)
Admission: RE | Admit: 2012-11-14 | Discharge: 2012-11-14 | Disposition: A | Discharge status: Home / Self Care | Payer: Medicare Other | Source: Ambulatory Visit | Attending: Family Medicine | Admitting: Family Medicine

## 2012-11-14 VITALS — BP 120/70 | HR 75 | Resp 16 | Wt 205.8 lb

## 2012-11-14 DIAGNOSIS — I700 Atherosclerosis of aorta: Secondary | ICD-10-CM | POA: Insufficient documentation

## 2012-11-14 DIAGNOSIS — R5381 Other malaise: Secondary | ICD-10-CM

## 2012-11-14 DIAGNOSIS — R05 Cough: Secondary | ICD-10-CM

## 2012-11-14 DIAGNOSIS — R059 Cough, unspecified: Secondary | ICD-10-CM | POA: Insufficient documentation

## 2012-11-14 DIAGNOSIS — R053 Chronic cough: Secondary | ICD-10-CM

## 2012-11-14 DIAGNOSIS — E119 Type 2 diabetes mellitus without complications: Secondary | ICD-10-CM

## 2012-11-14 DIAGNOSIS — E039 Hypothyroidism, unspecified: Secondary | ICD-10-CM

## 2012-11-14 DIAGNOSIS — R911 Solitary pulmonary nodule: Secondary | ICD-10-CM | POA: Insufficient documentation

## 2012-11-14 DIAGNOSIS — E785 Hyperlipidemia, unspecified: Secondary | ICD-10-CM

## 2012-11-14 DIAGNOSIS — I10 Essential (primary) hypertension: Secondary | ICD-10-CM

## 2012-11-14 DIAGNOSIS — A6004 Herpesviral vulvovaginitis: Secondary | ICD-10-CM

## 2012-11-14 LAB — HEMOGLOBIN A1C
Hgb A1c MFr Bld: 6 % — ABNORMAL HIGH (ref <5.7)
Mean Plasma Glucose: 126 mg/dL — ABNORMAL HIGH (ref <117)

## 2012-11-14 MED ORDER — METOPROLOL SUCCINATE ER 50 MG PO TB24: ORAL_TABLET | ORAL | Status: DC

## 2012-11-14 MED ORDER — ACYCLOVIR 400 MG PO TABS: 400.0000 mg | ORAL_TABLET | Freq: Two times a day (BID) | ORAL | Status: DC

## 2012-11-14 MED ORDER — CALCIUM CARBONATE-VITAMIN D 500-200 MG-UNIT PO TABS: 1.0000 | ORAL_TABLET | Freq: Two times a day (BID) | ORAL | Status: AC

## 2012-11-14 MED ORDER — CITALOPRAM HYDROBROMIDE 20 MG PO TABS: ORAL_TABLET | ORAL | Status: DC

## 2012-11-14 MED ORDER — AMLODIPINE BESYLATE 2.5 MG PO TABS: ORAL_TABLET | ORAL | Status: DC

## 2012-11-14 NOTE — Patient Instructions (Addendum)
F/u second week in September.   CXR today for chronic cough  Labs today: lipid, cmp and EGFR, HBA1C and TSH , we will call you with results  Please look at home  And check so you can let us know what if any medication you have been taking for cholesterol and diabetes You do not have this with you  STOP avalide, and clonidine  blood pressure over corrected for your age , and you get lightheaded. New for blood pressure is amlodipine , continue metoprolol   Use medication for cough only if congested , I believe cough is from one of the meds you were taking. I will not refill the cough pill unless you ask for it, I am removing it from your chronic med list

## 2012-11-15 LAB — LIPID PANEL
Cholesterol: 173 mg/dL (ref 0–200)
HDL: 47 mg/dL (ref >39)
LDL Cholesterol: 104 mg/dL — ABNORMAL HIGH (ref 0–99)
Total CHOL/HDL Ratio: 3.7 Ratio
Triglycerides: 109 mg/dL (ref <150)
VLDL: 22 mg/dL (ref 0–40)

## 2012-11-15 LAB — COMPLETE METABOLIC PANEL WITH GFR
ALT: 14 U/L (ref 0–35)
AST: 16 U/L (ref 0–37)
Albumin: 4 g/dL (ref 3.5–5.2)
Alkaline Phosphatase: 57 U/L (ref 39–117)
BUN: 31 mg/dL — ABNORMAL HIGH (ref 6–23)
CO2: 25 mEq/L (ref 19–32)
Calcium: 9.4 mg/dL (ref 8.4–10.5)
Chloride: 107 mEq/L (ref 96–112)
Creat: 2.11 mg/dL — ABNORMAL HIGH (ref 0.50–1.10)
GFR, Est African American: 24 mL/min — ABNORMAL LOW
GFR, Est Non African American: 21 mL/min — ABNORMAL LOW
Glucose, Bld: 117 mg/dL — ABNORMAL HIGH (ref 70–99)
Potassium: 4.8 mEq/L (ref 3.5–5.3)
Sodium: 142 mEq/L (ref 135–145)
Total Bilirubin: 0.3 mg/dL (ref 0.3–1.2)
Total Protein: 7.3 g/dL (ref 6.0–8.3)

## 2012-11-15 LAB — TSH: TSH: 4.466 u[IU]/mL (ref 0.350–4.500)

## 2012-11-15 NOTE — Assessment & Plan Note (Addendum)
Will f/u lab and determine med need when abvailable  Hyperlipidemia:Low fat diet discussed and encouraged.

## 2012-11-15 NOTE — Assessment & Plan Note (Signed)
Over corrected, pt symptomatic and possible ARB cough, will change meds and review in 4 to 5 weeks

## 2012-11-15 NOTE — Assessment & Plan Note (Signed)
Daily acyclovir to continue

## 2012-11-15 NOTE — Assessment & Plan Note (Signed)
Patient advised to reduce carb and sweets, commit to regular physical activity, take meds as prescribed, test blood as directed, and attempt to lose weight, to improve blood sugar control. Will f/u lab, and determine need, likely dies control will be ok , metformin CI based on creatinine today

## 2012-11-15 NOTE — Assessment & Plan Note (Signed)
cxr to be obtained , may be due to BP med, will change this

## 2012-11-15 NOTE — Progress Notes (Signed)
  Subjective:    Patient ID: Linda Richardson, female    DOB: October 20, 1923, 79 y.o.   MRN: [id number]  HPI  The PT is here for follow up and re-evaluation of chronic medical conditions, medication management and review of any available recent lab and radiology data.  Preventive health is updated, specifically  Cancer screening and Immunization.   Questions or concerns regarding consultations or procedures which the PT has had in the interim are  addressed. C/o lightheadeness with change in position which is worsening and feels tired at times.  C/o chronic dry cough and tickle in the throat for month, no fever , chills, reflux symptoms or URI symptoms.  Eating less sugar with successful weight loss. Recently stressed die to her son's hospitalization, he is doing better   Review of Systems See HPI Denies recent fever or chills. Denies sinus pressure, nasal congestion, ear pain or sore throat. Denies chest congestion, productive cough or wheezing. Denies chest pains, palpitations and leg swelling Denies abdominal pain, nausea, vomiting,diarrhea or constipation.   Denies dysuria, frequency, hesitancy or incontinence. Denies joint pain, swelling and limitation in mobility. Denies headaches, seizures, numbness, or tingling.  Denies skin break down or rash.         Objective:   Physical Exam  Patient alert and oriented and in no cardiopulmonary distress.  HEENT: No facial asymmetry, EOMI, no sinus tenderness,  oropharynx pink and moist.  Neck supple no adenopathy.  Chest: Clear to auscultation bilaterally.  CVS: S1, S2 no murmurs, no S3.  ABD: Soft non tender. Bowel sounds normal.  Ext: No edema  MS: Adequate though reduced ROM spine, shoulders, hips and knees.  Skin: Intact, no ulcerations or rash noted.  Psych: Good eye contact, normal affect. Memory intact not anxious or depressed appearing.  CNS: CN 2-12 intact, power, tone and sensation normal throughout.        Assessment & Plan:

## 2012-11-16 ENCOUNTER — Other Ambulatory Visit: Payer: Self-pay | Admitting: Family Medicine

## 2012-11-24 LAB — BASIC METABOLIC PANEL
BUN: 19 mg/dL (ref 6–23)
CO2: 25 mEq/L (ref 19–32)
Calcium: 9.2 mg/dL (ref 8.4–10.5)
Chloride: 108 mEq/L (ref 96–112)
Creat: 1.51 mg/dL — ABNORMAL HIGH (ref 0.50–1.10)
Glucose, Bld: 103 mg/dL — ABNORMAL HIGH (ref 70–99)
Potassium: 4.6 mEq/L (ref 3.5–5.3)
Sodium: 137 mEq/L (ref 135–145)

## 2012-12-05 ENCOUNTER — Telehealth: Payer: Self-pay | Admitting: Family Medicine

## 2012-12-13 ENCOUNTER — Other Ambulatory Visit: Payer: Self-pay | Admitting: Family Medicine

## 2012-12-13 MED ORDER — VALACYCLOVIR HCL 500 MG PO TABS: 500.0000 mg | ORAL_TABLET | Freq: Every day | ORAL | Status: AC

## 2012-12-13 MED ORDER — VALACYCLOVIR HCL 500 MG PO TABS: 500.0000 mg | ORAL_TABLET | Freq: Two times a day (BID) | ORAL | Status: AC

## 2012-12-13 NOTE — Telephone Encounter (Signed)
Called and left message for patient to return call.  

## 2012-12-13 NOTE — Telephone Encounter (Signed)
Patient aware.

## 2012-12-13 NOTE — Telephone Encounter (Signed)
pls send in the valtrex and let pt and pharmacy know, note 2 different scripts , 2 different directions, thanks

## 2012-12-13 NOTE — Telephone Encounter (Signed)
pls let me know what her concern is

## 2012-12-13 NOTE — Telephone Encounter (Signed)
States her acyclovir 400 isn't working good and she wants to know if you will put her back on the 500mg . That kept her burning away but with the 465m, the burning keeps coming back before its time for her next dose  [place]

## 2012-12-15 ENCOUNTER — Telehealth: Payer: Self-pay

## 2012-12-15 NOTE — Telephone Encounter (Signed)
Advise leg elevation and stay off feet.If not reduced in am ,  May need to go to urgent care Enquire and document if having orthopnea, using extra pillows when lying down, or waking up SOB. Document duration in days of her symptoms and if worsening If concern re heart failure,that is , if  a lot of these response are positive , then need to go to urgent care or ED. (From msg I have received I suspect these are all no, but I cannot be sure , so pls follow through)

## 2012-12-15 NOTE — Telephone Encounter (Signed)
Patient aware.

## 2012-12-28 ENCOUNTER — Encounter: Payer: Self-pay | Admitting: Family Medicine

## 2012-12-28 ENCOUNTER — Ambulatory Visit (INDEPENDENT_AMBULATORY_CARE_PROVIDER_SITE_OTHER): Payer: Medicare Other | Admitting: Family Medicine

## 2012-12-28 VITALS — BP 134/80 | HR 80 | Resp 16 | Wt 211.0 lb

## 2012-12-28 DIAGNOSIS — F3289 Other specified depressive episodes: Secondary | ICD-10-CM

## 2012-12-28 DIAGNOSIS — E119 Type 2 diabetes mellitus without complications: Secondary | ICD-10-CM

## 2012-12-28 DIAGNOSIS — R059 Cough, unspecified: Secondary | ICD-10-CM

## 2012-12-28 DIAGNOSIS — I10 Essential (primary) hypertension: Secondary | ICD-10-CM

## 2012-12-28 DIAGNOSIS — R053 Chronic cough: Secondary | ICD-10-CM

## 2012-12-28 DIAGNOSIS — Z1211 Encounter for screening for malignant neoplasm of colon: Secondary | ICD-10-CM

## 2012-12-28 DIAGNOSIS — R5381 Other malaise: Secondary | ICD-10-CM

## 2012-12-28 DIAGNOSIS — F329 Major depressive disorder, single episode, unspecified: Secondary | ICD-10-CM

## 2012-12-28 DIAGNOSIS — R05 Cough: Secondary | ICD-10-CM

## 2012-12-28 DIAGNOSIS — E785 Hyperlipidemia, unspecified: Secondary | ICD-10-CM

## 2012-12-28 DIAGNOSIS — E039 Hypothyroidism, unspecified: Secondary | ICD-10-CM

## 2012-12-28 LAB — HEMOCCULT GUIAC POC 1CARD (OFFICE): Fecal Occult Blood, POC: NEGATIVE

## 2012-12-28 MED ORDER — AMLODIPINE BESYLATE 2.5 MG PO TABS: ORAL_TABLET | ORAL | Status: DC

## 2012-12-28 NOTE — Patient Instructions (Addendum)
F/u in mid December , pls call if you need me before   HBA1C, chem 7 and EGFR, TSH and  microalb for Decmber  visit.  You will get a script for valtrex 500 mg ONE daily, this is your maintenance dose   PLS reconsider the flu vaccine , call if you change your mind  Number for mammogram in [place] to call is [phone]  Ext [phone] to make appt  Rectal today  Blood pressure is excellent

## 2012-12-28 NOTE — Progress Notes (Signed)
  Subjective:    Patient ID: Linda Richardson, female    DOB: March 01, 1924, 79 y.o.   MRN: [id number]  HPI The PT is here for follow up and re-evaluation of chronic medical conditions, medication management and review of any available recent lab and radiology data.  Preventive health is updated, specifically  Cancer screening and Immunization. Refusing flyu vaccine today and states she will schedule her mammogram  . The PT denies any adverse reactions to current medications since the last visit.  There are no new concerns.  There are no specific complaints       Review of Systems See HPI Denies recent fever or chills. Denies sinus pressure, nasal congestion, ear pain or sore throat. Denies chest congestion, productive cough or wheezing. Denies chest pains, palpitations and leg swelling Denies abdominal pain, nausea, vomiting,diarrhea or constipation.   Denies dysuria, frequency, hesitancy or incontinence. Denies disabling  joint pain, swelling and limitation in mobility. Denies headaches, seizures, numbness, or tingling. Denies uncontrolled depression, anxiety or insomnia. 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  HEENT: No facial asymmetry, EOMI, no sinus tenderness,  oropharynx pink and moist.  Neck supple no adenopathy.  Chest: Clear to auscultation bilaterally.  CVS: S1, S2 no murmurs, no S3.  ABD: Soft non tender. Bowel sounds normal. Rectal: no mass, heme negative stool Ext: No edema  MS: Adequate though reduced  ROM spine, shoulders, hips and knees.  Skin: Intact, no ulcerations or rash noted.  Psych: Good eye contact, normal affect. Memory intact not anxious or depressed appearing.  CNS: CN 2-12 intact, power, tone and sensation normal  throughout.        Assessment & Plan:

## 2013-01-12 ENCOUNTER — Telehealth: Payer: Self-pay

## 2013-01-12 ENCOUNTER — Other Ambulatory Visit: Payer: Self-pay | Admitting: Family Medicine

## 2013-01-12 NOTE — Telephone Encounter (Signed)
Acyclovir has been entered, ok to send in place of famvir, BUT she needs to understand no more switches possible, so maybe she can hold off the medication for a while to see if she really needs to take any

## 2013-01-12 NOTE — Telephone Encounter (Signed)
The valcyclovir isn't working and makes her feel funny. Wants to go back on what she took a while back. She thinks its acyclovir 800. Wants it sent to the pharmacy

## 2013-01-13 MED ORDER — ACYCLOVIR 400 MG PO TABS: 400.0000 mg | ORAL_TABLET | Freq: Two times a day (BID) | ORAL | Status: DC

## 2013-01-13 NOTE — Telephone Encounter (Signed)
Message left

## 2013-01-16 NOTE — Assessment & Plan Note (Signed)
controlled with dietary restraint only, no change

## 2013-01-16 NOTE — Assessment & Plan Note (Signed)
Controlled, no change in medication  

## 2013-01-16 NOTE — Assessment & Plan Note (Signed)
Controlled, no change in medication DASH diet and commitment to daily physical activity for a minimum of 30 minutes discussed and encouraged, as a part of hypertension management. The importance of attaining a healthy weight is also discussed.  

## 2013-01-16 NOTE — Assessment & Plan Note (Signed)
Elevated lDl low fat diet discussed and encouraged

## 2013-01-17 ENCOUNTER — Other Ambulatory Visit: Payer: Self-pay | Admitting: Family Medicine

## 2013-03-03 ENCOUNTER — Telehealth: Payer: Self-pay

## 2013-03-03 ENCOUNTER — Other Ambulatory Visit: Payer: Self-pay

## 2013-03-03 NOTE — Telephone Encounter (Signed)
Patient aware.

## 2013-03-03 NOTE — Telephone Encounter (Signed)
If excessively swollen  In 1 leg only as it sounds, needs to go to the Ed for eval, may be dVT

## 2013-03-28 ENCOUNTER — Other Ambulatory Visit: Payer: Self-pay | Admitting: Family Medicine

## 2013-03-29 ENCOUNTER — Ambulatory Visit: Payer: Medicare Other | Admitting: Family Medicine

## 2013-05-01 ENCOUNTER — Ambulatory Visit: Payer: Medicare Other | Admitting: Family Medicine

## 2013-05-03 ENCOUNTER — Other Ambulatory Visit: Payer: Self-pay | Admitting: Family Medicine

## 2013-05-27 ENCOUNTER — Other Ambulatory Visit: Payer: Self-pay | Admitting: Family Medicine

## 2013-06-08 ENCOUNTER — Ambulatory Visit: Payer: Medicare Other | Admitting: Family Medicine

## 2013-06-20 ENCOUNTER — Ambulatory Visit (INDEPENDENT_AMBULATORY_CARE_PROVIDER_SITE_OTHER): Payer: Medicare Other | Admitting: Family Medicine

## 2013-06-20 ENCOUNTER — Encounter: Payer: Self-pay | Admitting: Family Medicine

## 2013-06-20 ENCOUNTER — Encounter (INDEPENDENT_AMBULATORY_CARE_PROVIDER_SITE_OTHER): Payer: Self-pay

## 2013-06-20 VITALS — BP 180/90 | HR 75 | Resp 16 | Ht 65.0 in | Wt 213.0 lb

## 2013-06-20 DIAGNOSIS — R5381 Other malaise: Secondary | ICD-10-CM

## 2013-06-20 DIAGNOSIS — I10 Essential (primary) hypertension: Secondary | ICD-10-CM

## 2013-06-20 DIAGNOSIS — E039 Hypothyroidism, unspecified: Secondary | ICD-10-CM

## 2013-06-20 DIAGNOSIS — F3289 Other specified depressive episodes: Secondary | ICD-10-CM

## 2013-06-20 DIAGNOSIS — Z139 Encounter for screening, unspecified: Secondary | ICD-10-CM

## 2013-06-20 DIAGNOSIS — R5383 Other fatigue: Secondary | ICD-10-CM

## 2013-06-20 DIAGNOSIS — E785 Hyperlipidemia, unspecified: Secondary | ICD-10-CM

## 2013-06-20 DIAGNOSIS — R7301 Impaired fasting glucose: Secondary | ICD-10-CM

## 2013-06-20 DIAGNOSIS — F329 Major depressive disorder, single episode, unspecified: Secondary | ICD-10-CM

## 2013-06-20 DIAGNOSIS — E119 Type 2 diabetes mellitus without complications: Secondary | ICD-10-CM

## 2013-06-20 DIAGNOSIS — M899 Disorder of bone, unspecified: Secondary | ICD-10-CM

## 2013-06-20 DIAGNOSIS — M949 Disorder of cartilage, unspecified: Secondary | ICD-10-CM

## 2013-06-20 NOTE — Patient Instructions (Addendum)
F/u in 4 weeks, call if you need me before  Lipid, cmp and EGFr, hBA1C TSH , vit D and CBC today  Your daughter will be contacted tomorrow about the medication you need to be on and this will be sent to the pharmacy for different packaging  Microalb from office today  Blood pressure is high so medication will need to be adjusted

## 2013-06-21 LAB — TSH: TSH: 4.337 u[IU]/mL (ref 0.350–4.500)

## 2013-06-21 LAB — COMPLETE METABOLIC PANEL WITH GFR
ALT: 16 U/L (ref 0–35)
AST: 18 U/L (ref 0–37)
Albumin: 3.6 g/dL (ref 3.5–5.2)
Alkaline Phosphatase: 53 U/L (ref 39–117)
BUN: 14 mg/dL (ref 6–23)
CO2: 22 mEq/L (ref 19–32)
Calcium: 8.9 mg/dL (ref 8.4–10.5)
Chloride: 107 mEq/L (ref 96–112)
Creat: 1.17 mg/dL — ABNORMAL HIGH (ref 0.50–1.10)
GFR, Est African American: 48 mL/min — ABNORMAL LOW
GFR, Est Non African American: 42 mL/min — ABNORMAL LOW
Glucose, Bld: 109 mg/dL — ABNORMAL HIGH (ref 70–99)
Potassium: 4 mEq/L (ref 3.5–5.3)
Sodium: 138 mEq/L (ref 135–145)
Total Bilirubin: 0.4 mg/dL (ref 0.2–1.2)
Total Protein: 7.1 g/dL (ref 6.0–8.3)

## 2013-06-21 LAB — CBC WITH DIFFERENTIAL/PLATELET
Basophils Absolute: 0 10*3/uL (ref 0.0–0.1)
Basophils Relative: 1 % (ref 0–1)
Eosinophils Absolute: 0.1 10*3/uL (ref 0.0–0.7)
Eosinophils Relative: 2 % (ref 0–5)
HCT: 38.5 % (ref 36.0–46.0)
Hemoglobin: 13.1 g/dL (ref 12.0–15.0)
Lymphocytes Relative: 31 % (ref 12–46)
Lymphs Abs: 1.1 10*3/uL (ref 0.7–4.0)
MCH: 30 pg (ref 26.0–34.0)
MCHC: 34 g/dL (ref 30.0–36.0)
MCV: 88.3 fL (ref 78.0–100.0)
Monocytes Absolute: 0.4 10*3/uL (ref 0.1–1.0)
Monocytes Relative: 12 % (ref 3–12)
Neutro Abs: 1.8 10*3/uL (ref 1.7–7.7)
Neutrophils Relative %: 54 % (ref 43–77)
Platelets: 195 10*3/uL (ref 150–400)
RBC: 4.36 MIL/uL (ref 3.87–5.11)
RDW: 16 % — ABNORMAL HIGH (ref 11.5–15.5)
WBC: 3.4 10*3/uL — ABNORMAL LOW (ref 4.0–10.5)

## 2013-06-21 LAB — LIPID PANEL
Cholesterol: 200 mg/dL (ref 0–200)
HDL: 60 mg/dL (ref >39)
LDL Cholesterol: 125 mg/dL — ABNORMAL HIGH (ref 0–99)
Total CHOL/HDL Ratio: 3.3 Ratio
Triglycerides: 74 mg/dL (ref <150)
VLDL: 15 mg/dL (ref 0–40)

## 2013-06-21 LAB — MICROALBUMIN / CREATININE URINE RATIO
Creatinine, Urine: 193 mg/dL
Microalb Creat Ratio: 147.6 mg/g — ABNORMAL HIGH (ref 0.0–30.0)
Microalb, Ur: 28.49 mg/dL — ABNORMAL HIGH (ref 0.00–1.89)

## 2013-06-21 LAB — HEMOGLOBIN A1C
Hgb A1c MFr Bld: 6.3 % — ABNORMAL HIGH (ref <5.7)
Mean Plasma Glucose: 134 mg/dL — ABNORMAL HIGH (ref <117)

## 2013-06-22 LAB — VITAMIN D 25 HYDROXY (VIT D DEFICIENCY, FRACTURES): Vit D, 25-Hydroxy: 34 ng/mL (ref 30–89)

## 2013-06-23 ENCOUNTER — Other Ambulatory Visit: Payer: Self-pay | Admitting: Family Medicine

## 2013-06-24 ENCOUNTER — Other Ambulatory Visit: Payer: Self-pay

## 2013-06-24 MED ORDER — METOPROLOL SUCCINATE ER 50 MG PO TB24: ORAL_TABLET | ORAL | Status: DC

## 2013-06-26 ENCOUNTER — Telehealth: Payer: Self-pay

## 2013-06-26 ENCOUNTER — Other Ambulatory Visit: Payer: Self-pay

## 2013-06-26 MED ORDER — ACYCLOVIR 400 MG PO TABS
400.0000 mg | ORAL_TABLET | Freq: Two times a day (BID) | ORAL | Status: DC
Start: 2013-06-26 — End: 2014-01-01

## 2013-06-26 MED ORDER — CITALOPRAM HYDROBROMIDE 20 MG PO TABS: ORAL_TABLET | ORAL | Status: DC

## 2013-06-26 NOTE — Telephone Encounter (Signed)
Since they were not swollen when last here, better for her to limit her salt and elevate  and wear compression hose. There is a risk of dehydration and abnormal potassium etc when pts self medicate with "fluid pills"

## 2013-06-27 NOTE — Telephone Encounter (Signed)
Patient aware.

## 2013-07-19 ENCOUNTER — Ambulatory Visit: Payer: Medicare Other | Admitting: Family Medicine

## 2013-08-29 ENCOUNTER — Encounter: Payer: Self-pay | Admitting: Family Medicine

## 2013-08-29 ENCOUNTER — Encounter (INDEPENDENT_AMBULATORY_CARE_PROVIDER_SITE_OTHER): Payer: Self-pay

## 2013-08-29 ENCOUNTER — Ambulatory Visit (INDEPENDENT_AMBULATORY_CARE_PROVIDER_SITE_OTHER): Payer: Medicare Other | Admitting: Family Medicine

## 2013-08-29 VITALS — BP 200/100 | HR 78 | Resp 16 | Wt 213.0 lb

## 2013-08-29 DIAGNOSIS — R7302 Impaired glucose tolerance (oral): Secondary | ICD-10-CM

## 2013-08-29 DIAGNOSIS — F4321 Adjustment disorder with depressed mood: Secondary | ICD-10-CM | POA: Insufficient documentation

## 2013-08-29 DIAGNOSIS — F3289 Other specified depressive episodes: Secondary | ICD-10-CM

## 2013-08-29 DIAGNOSIS — R5383 Other fatigue: Secondary | ICD-10-CM

## 2013-08-29 DIAGNOSIS — R7309 Other abnormal glucose: Secondary | ICD-10-CM

## 2013-08-29 DIAGNOSIS — Z634 Disappearance and death of family member: Secondary | ICD-10-CM

## 2013-08-29 DIAGNOSIS — F329 Major depressive disorder, single episode, unspecified: Secondary | ICD-10-CM

## 2013-08-29 DIAGNOSIS — I10 Essential (primary) hypertension: Secondary | ICD-10-CM

## 2013-08-29 DIAGNOSIS — E785 Hyperlipidemia, unspecified: Secondary | ICD-10-CM

## 2013-08-29 DIAGNOSIS — R5381 Other malaise: Secondary | ICD-10-CM

## 2013-08-29 MED ORDER — CITALOPRAM HYDROBROMIDE 20 MG PO TABS
ORAL_TABLET | ORAL | Status: DC
Start: 2013-08-29 — End: 2014-01-30

## 2013-08-29 MED ORDER — SPIRONOLACTONE 25 MG PO TABS: 25.0000 mg | ORAL_TABLET | Freq: Two times a day (BID) | ORAL | Status: DC

## 2013-08-29 NOTE — Assessment & Plan Note (Signed)
Will refer to therapy, due to recent loss of child increased depression and grief which is appropriate and expected. Not suicidal or homicidal, she has buried 2 of her 4 children and has no grandchildren

## 2013-08-29 NOTE — Assessment & Plan Note (Signed)
Diet controlled , continue same .  

## 2013-08-29 NOTE — Progress Notes (Signed)
   Subjective:    Patient ID: Linda Richardson, female    DOB: 04/14/1924, 80 y.o.   MRN: [id number]  HPI The PT is here for follow up and re-evaluation of chronic medical conditions, specifically uncontrolled hTN, medication management and review of any available recent lab and radiology data.  Preventive health is updated, specifically  Cancer screening and Immunization.   The PT denies any adverse reactions to current medications since the last visit.  Unfortunately, she buried her son early in 08-07-2022, death was unexpected and occurred at home. She is grieving and tearful, but has not allowed full expression of her feelings to be released, willing to see therapist      Review of Systems    See HPI Denies recent fever or chills. Denies sinus pressure, nasal congestion, ear pain or sore throat. Denies chest congestion, productive cough or wheezing. Denies chest pains, palpitations and leg swelling Denies abdominal pain, nausea, vomiting,diarrhea or constipation.   Denies dysuria, frequency, hesitancy or incontinence. Chronic  joint pain, swelling and no significant  limitation in mobility. Denies headaches, seizures, numbness, or tingling. Not suicidal, or homicidal, no hallucinations Denies skin break down or rash.     Objective:   Physical Exam  BP 200/100  Pulse 78  Resp 16  Wt 213 lb (96.616 kg)  SpO2 96% Patient alert and oriented and in no cardiopulmonary distress.Tearful at times  HEENT: No facial asymmetry, EOMI, no sinus tenderness,  oropharynx pink and moist.  Neck supple no adenopathy.  Chest: Clear to auscultation bilaterally.  CVS: S1, S2 no murmurs, no S3.  ABD: Soft non tender. Bowel sounds normal.  Ext: No edema  MS: Adequate though reduced  ROM spine, shoulders, hips and knees.  Skin: Intact, no ulcerations or rash noted.  Psych: Good eye contact, sad affect. Memory intact not anxious but  depressed appearing.  CNS: CN 2-12 intact, power, tone  and sensation normal throughout.       Assessment & Plan:  Grief at loss of child Pt unexpectedly lost her 98 y/o son end March, grieving, needs therapy  HYPERTENSION Uncontrolled, markedly elevated blood pressure. Medication adjustment made with close follow up  HYPERLIPIDEMIA Hyperlipidemia:Low fat diet discussed and encouraged.  Currently on no medication  DEPRESSION Will refer to therapy, due to recent loss of child increased depression and grief which is appropriate and expected. Not suicidal or homicidal, she has buried 2 of her 4 children and has no grandchildren  Impaired glucose tolerance Diet controlled , continue same

## 2013-08-29 NOTE — Assessment & Plan Note (Signed)
Pt unexpectedly lost her 78 y/o son end March, grieving, needs therapy

## 2013-08-29 NOTE — Assessment & Plan Note (Signed)
Hyperlipidemia:Low fat diet discussed and encouraged.  Currently on no medication

## 2013-08-29 NOTE — Assessment & Plan Note (Signed)
Uncontrolled, markedly elevated blood pressure. Medication adjustment made with close follow up

## 2013-08-29 NOTE — Patient Instructions (Signed)
F/u June 4 or soon after.  Blood pressure is too high.  Continue medication you are taking.  New is spironolactone one twice daily, 7am and 7 pm  Today take the first spironolactone as soon as you get it from the pharmacy, hopefully by 10:30 to 11am, and take the 2nd one today at 7 pm  HBA1C, chem 7 and EGFR on June 4 , non fasting  You are referred for therapy to help as you cope with the loss of your son, please accept my condolence and prayerful support

## 2013-09-08 ENCOUNTER — Ambulatory Visit: Payer: Medicare Other | Admitting: Family Medicine

## 2013-09-22 ENCOUNTER — Ambulatory Visit (INDEPENDENT_AMBULATORY_CARE_PROVIDER_SITE_OTHER): Payer: Medicare Other | Admitting: Family Medicine

## 2013-09-22 ENCOUNTER — Encounter: Payer: Self-pay | Admitting: Family Medicine

## 2013-09-22 VITALS — BP 178/96 | HR 80 | Resp 18 | Ht 65.0 in | Wt 217.0 lb

## 2013-09-22 DIAGNOSIS — F4321 Adjustment disorder with depressed mood: Secondary | ICD-10-CM

## 2013-09-22 DIAGNOSIS — R7302 Impaired glucose tolerance (oral): Secondary | ICD-10-CM

## 2013-09-22 DIAGNOSIS — Z634 Disappearance and death of family member: Secondary | ICD-10-CM

## 2013-09-22 DIAGNOSIS — I10 Essential (primary) hypertension: Secondary | ICD-10-CM

## 2013-09-22 DIAGNOSIS — E039 Hypothyroidism, unspecified: Secondary | ICD-10-CM

## 2013-09-22 DIAGNOSIS — E785 Hyperlipidemia, unspecified: Secondary | ICD-10-CM

## 2013-09-22 DIAGNOSIS — R7309 Other abnormal glucose: Secondary | ICD-10-CM

## 2013-09-22 DIAGNOSIS — F329 Major depressive disorder, single episode, unspecified: Secondary | ICD-10-CM

## 2013-09-22 DIAGNOSIS — F3289 Other specified depressive episodes: Secondary | ICD-10-CM

## 2013-09-22 DIAGNOSIS — A6004 Herpesviral vulvovaginitis: Secondary | ICD-10-CM

## 2013-09-22 MED ORDER — SPIRONOLACTONE 50 MG PO TABS: 50.0000 mg | ORAL_TABLET | Freq: Two times a day (BID) | ORAL | Status: DC

## 2013-09-22 MED ORDER — AMLODIPINE BESYLATE 5 MG PO TABS: 5.0000 mg | ORAL_TABLET | Freq: Every day | ORAL | Status: DC

## 2013-09-22 NOTE — Patient Instructions (Signed)
F/u 2nd week in July, call if you need me before  Increase in amlodipine to 5mg  one daily, and sopironolactone to 50 mg twice daily  OK to take TWO amlodipine 2.5mg  tabs daily, till done, and spironolactone 25 mg two tablets twice daily till done  Chem 6 7 and HBa1C today

## 2013-09-23 NOTE — Assessment & Plan Note (Signed)
Remains uncontrolled, inc doses of amlodipine and spironolactone DASH diet and commitment to daily physical activity for a minimum of 30 minutes discussed and encouraged, as a part of hypertension management. The importance of attaining a healthy weight is also discussed.

## 2013-09-23 NOTE — Assessment & Plan Note (Signed)
Diet controlled Patient educated about the importance of limiting  Carbohydrate intake , the need to commit to daily physical activity for a minimum of 30 minutes , and to commit weight loss. The fact that changes in all these areas will reduce or eliminate all together the development of diabetes is stressed.

## 2013-09-23 NOTE — Assessment & Plan Note (Signed)
Gradually lessening but still has a long way to go

## 2013-09-23 NOTE — Assessment & Plan Note (Signed)
Asymptomatic on daily suppression

## 2013-09-23 NOTE — Assessment & Plan Note (Signed)
slightl

## 2013-09-23 NOTE — Progress Notes (Signed)
   Subjective:    Patient ID: Linda Richardson, female    DOB: [birth year], 80 y.o.   MRN: [id number]  HPI The PT is here for follow up and re-evaluation of chronic medical conditions, medication management and review of any available recent lab and radiology data. Specific concern is with regard to uncontrolled HTN and grief with depression Preventive health is updated, specifically  Cancer screening and Immunization.   Pt states no interest in therapy at this time for grief and states she is doing better The PT notes slight leg swelling at the end of the day with BP med, a known s/e of amlodipine, she is reassured that heart and kidney function are normal, and she is encouraged to elevate her legs to control swelling. There are no new concerns.  There are no specific complaints       Review of Systems See HPI Denies recent fever or chills. Denies sinus pressure, nasal congestion, ear pain or sore throat. Denies chest congestion, productive cough or wheezing. Denies chest pains, palpitations, PND or orthopnea, doe note leg swelling Denies abdominal pain, nausea, vomiting,diarrhea or constipation.   Denies dysuria, frequency, hesitancy or incontinence. Chronic  joint pain, swelling and limitation in mobility. Denies headaches, seizures, numbness, or tingling. Reports improvement in symptoms of grief, less crying spells, depression also somewhat lessened Denies skin break down or rash.        Objective:   Physical Exam BP 178/96  Pulse 80  Resp 18  Ht 5\' 5"  (1.651 m)  Wt 217 lb (98.431 kg)  BMI 36.11 kg/m2  SpO2 97% Patient alert and oriented and in no cardiopulmonary distress.  HEENT: No facial asymmetry, EOMI, no sinus tenderness,  oropharynx pink and moist.  Neck decreased though adequate RON, no JVD, no adenopathy.  Chest: Clear to auscultation bilaterally.  CVS: S1, S2 no murmurs, no S3.  ABD: Soft non tender. Bowel sounds normal.  Ext: Trace  edema  MS:  Decreased ROM spine, shoulders, hips and knees.  Skin: Intact, no ulcerations or rash noted.  Psych: Good eye contact, normal affect. Memory intact not anxious or depressed appearing.  C     Assessment & Plan:  HYPERTENSION Remains uncontrolled, inc doses of amlodipine and spironolactone DASH diet and commitment to daily physical activity for a minimum of 30 minutes discussed and encouraged, as a part of hypertension management. The importance of attaining a healthy weight is also discussed.   HYPOTHYROIDISM Controlled, no change in medication   HYPERLIPIDEMIA Hyperlipidemia:Low fat diet discussed and encouraged.  Will add statin low dose at next visit  DEPRESSION slightl  Impaired glucose tolerance Diet controlled Patient educated about the importance of limiting  Carbohydrate intake , the need to commit to daily physical activity for a minimum of 30 minutes , and to commit weight loss. The fact that changes in all these areas will reduce or eliminate all together the development of diabetes is stressed.     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

## 2013-09-23 NOTE — Assessment & Plan Note (Signed)
Hyperlipidemia:Low fat diet discussed and encouraged.  Will add statin low dose at next visit

## 2013-09-23 NOTE — Assessment & Plan Note (Signed)
Controlled, no change in medication  

## 2013-09-25 ENCOUNTER — Telehealth (HOSPITAL_COMMUNITY): Payer: Self-pay | Admitting: *Deleted

## 2013-09-26 LAB — HEMOGLOBIN A1C
Hgb A1c MFr Bld: 6 % — ABNORMAL HIGH (ref <5.7)
Mean Plasma Glucose: 126 mg/dL — ABNORMAL HIGH (ref <117)

## 2013-09-26 LAB — BASIC METABOLIC PANEL
BUN: 16 mg/dL (ref 6–23)
CO2: 21 mEq/L (ref 19–32)
Calcium: 8.7 mg/dL (ref 8.4–10.5)
Chloride: 109 mEq/L (ref 96–112)
Creat: 1.18 mg/dL — ABNORMAL HIGH (ref 0.50–1.10)
Glucose, Bld: 118 mg/dL — ABNORMAL HIGH (ref 70–99)
Potassium: 3.5 mEq/L (ref 3.5–5.3)
Sodium: 141 mEq/L (ref 135–145)

## 2013-09-27 ENCOUNTER — Ambulatory Visit (HOSPITAL_COMMUNITY): Payer: Medicare Other | Admitting: Psychiatry

## 2013-10-12 ENCOUNTER — Other Ambulatory Visit: Payer: Self-pay | Admitting: Family Medicine

## 2013-11-02 ENCOUNTER — Encounter: Payer: Self-pay | Admitting: Family Medicine

## 2013-11-02 ENCOUNTER — Ambulatory Visit (INDEPENDENT_AMBULATORY_CARE_PROVIDER_SITE_OTHER): Payer: Medicare Other | Admitting: Family Medicine

## 2013-11-02 ENCOUNTER — Encounter (INDEPENDENT_AMBULATORY_CARE_PROVIDER_SITE_OTHER): Payer: Self-pay

## 2013-11-02 VITALS — BP 132/78 | HR 73 | Resp 16 | Wt 210.4 lb

## 2013-11-02 DIAGNOSIS — E669 Obesity, unspecified: Secondary | ICD-10-CM

## 2013-11-02 DIAGNOSIS — E119 Type 2 diabetes mellitus without complications: Secondary | ICD-10-CM

## 2013-11-02 DIAGNOSIS — E785 Hyperlipidemia, unspecified: Secondary | ICD-10-CM

## 2013-11-02 DIAGNOSIS — Z23 Encounter for immunization: Secondary | ICD-10-CM

## 2013-11-02 DIAGNOSIS — A6004 Herpesviral vulvovaginitis: Secondary | ICD-10-CM

## 2013-11-02 DIAGNOSIS — I10 Essential (primary) hypertension: Secondary | ICD-10-CM

## 2013-11-02 DIAGNOSIS — F3289 Other specified depressive episodes: Secondary | ICD-10-CM

## 2013-11-02 DIAGNOSIS — E039 Hypothyroidism, unspecified: Secondary | ICD-10-CM

## 2013-11-02 DIAGNOSIS — Z13820 Encounter for screening for osteoporosis: Secondary | ICD-10-CM

## 2013-11-02 DIAGNOSIS — F329 Major depressive disorder, single episode, unspecified: Secondary | ICD-10-CM

## 2013-11-02 MED ORDER — PRAVASTATIN SODIUM 20 MG PO TABS: 20.0000 mg | ORAL_TABLET | Freq: Every day | ORAL | Status: DC

## 2013-11-02 MED ORDER — ACYCLOVIR 400 MG PO TABS: 400.0000 mg | ORAL_TABLET | Freq: Three times a day (TID) | ORAL | Status: DC

## 2013-11-02 NOTE — Progress Notes (Signed)
   Subjective:    Patient ID: Linda Richardson, female    DOB: 1923-05-16, 80 y.o.   MRN: [id number]  HPI The PT is here for follow up and re-evaluation of chronic medical conditions, medication management and review of any available recent lab and radiology data.  Preventive health is updated, specifically  Cancer screening and Immunization.   . The PT denies any adverse reactions to current medications since the last visit.  There are no new concerns.  Feels good about the fact that her legs are no longer swollen and that her blood presure is now at goal Improvement in symptoms of depression associated with recent loss of her  Son Increased vulvo vaginal stinging and burning in past 2 weeks, no d/c     Review of Systems See HPI Denies recent fever or chills. Denies sinus pressure, nasal congestion, ear pain or sore throat. Denies chest congestion, productive cough or wheezing. Denies chest pains, palpitations and leg swelling Denies abdominal pain, nausea, vomiting,diarrhea or constipation.   Denies dysuria, frequency, hesitancy or incontinence. Denies joint pain, swelling and limitation in mobility. Denies headaches, seizures, numbness, or tingling. Denies uncontrolled  depression, anxiety or insomnia. .        Objective:   Physical Exam BP 132/78  Pulse 73  Resp 16  Wt 210 lb 6.4 oz (95.437 kg)  SpO2 96% Patient alert and oriented and in no cardiopulmonary distress.  HEENT: No facial asymmetry, EOMI,   oropharynx pink and moist.  Neck supple no JVD, no mass.  Chest: Clear to auscultation bilaterally.  CVS: S1, S2 no murmurs, no S3.Regular rate.  ABD: Soft non tender.   Ext: No edema  MS: Adequate ROM spine, shoulders, hips and knees.  Skin: Intact, no ulcerations or rash noted.  Psych: Good eye contact, normal affect. Memory intact not anxious or depressed appearing.  CNS: CN 2-12 intact, power,  normal throughout.no focal deficits noted.          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  Updated lab needed at/ before next visit.   DEPRESSION Improved, continue medication, pt recovering from recent loss of her son  OBESITY Improved. Pt applauded on succesful weight loss through lifestyle change, and encouraged to continue same. Weight loss goal set for the next several months.

## 2013-11-02 NOTE — Patient Instructions (Signed)
Annual wellness with rectal in 4 month, call if you need me before  Prevnar today  New for cholesterol is pravachol 48m at bedtime, follow low fat diet also  BP is excellent and leg swelling is gone which is GREAT, continue current BP meds  Blood sugar is improving continue eating as you are.   Fasting lipid, cmp and EGFr, HBA1c and TSH in 4 month  For flare of HSV2 infection, for 1 weeks only take the medication acyclovir 4083mthree times dialy, after this go back to one twice daily as before  You need a bone density test and will be referred for this  Foot exam today is good

## 2013-11-04 NOTE — Assessment & Plan Note (Signed)
Controlled, no change in medication  

## 2013-11-04 NOTE — Assessment & Plan Note (Signed)
Current flare, 1 week course of treatment of flare then back to maintainance therapy

## 2013-11-04 NOTE — Assessment & Plan Note (Signed)
Improved, continue medication, pt recovering from recent loss of her son

## 2013-11-04 NOTE — Assessment & Plan Note (Signed)
Controlled, no change in medication Patient advised to reduce carb and sweets,  and attempt to lose weight, to improve blood sugar control.Daiily physical activity as tolerated and able for improved health

## 2013-11-04 NOTE — Assessment & Plan Note (Addendum)
Uncontrolled , not at goal, pt to start low dose pravachol Hyperlipidemia:Low fat diet discussed and encouraged.  Updated lab needed at/ before next visit.

## 2013-11-04 NOTE — Assessment & Plan Note (Signed)
Improved. Pt applauded on succesful weight loss through lifestyle change, and encouraged to continue same. Weight loss goal set for the next several months.  

## 2013-11-05 NOTE — Assessment & Plan Note (Signed)
Controlled, no change in medication  

## 2013-11-05 NOTE — Assessment & Plan Note (Signed)
Unexpected recent loss of child. Needs to start medication and therapy offered

## 2013-11-05 NOTE — Assessment & Plan Note (Signed)
Uncontrolled Hyperlipidemia:Low fat diet discussed and encouraged.  Pt needs to start medication

## 2013-11-05 NOTE — Progress Notes (Signed)
   Subjective:    Patient ID: Linda Richardson, female    DOB: [birth year], 80 y.o.   MRN: [id number]  HPI The PT is here for follow up and re-evaluation of chronic medical conditions, medication management and review of any available recent lab and radiology data.  Preventive health is updated, specifically  Cancer screening and Immunization.   Questions or concerns regarding consultations or procedures which the PT has had in the interim are  addressed. The PT denies any adverse reactions to current medications since the last visit.  There are no new concerns.  There are no specific complaints       Review of Systems See HPI Denies recent fever or chills. Denies sinus pressure, nasal congestion, ear pain or sore throat. Denies chest congestion, productive cough or wheezing. Denies chest pains, palpitations and leg swelling Denies abdominal pain, nausea, vomiting,diarrhea or constipation.   Denies dysuria, frequency, hesitancy or incontinence. Denies joint pain, swelling and limitation in mobility. Denies headaches, seizures, numbness, or tingling. Denies depression, anxiety or insomnia. Denies skin break down or rash.        Objective:   Physical Exam BP 180/90  Pulse 75  Resp 16  Ht 5\' 5"  (1.651 m)  Wt 213 lb (96.616 kg)  BMI 35.44 kg/m2  SpO2 93% Patient alert and oriented and in no cardiopulmonary distress.  HEENT: No facial asymmetry, EOMI,   oropharynx pink and moist.  Neck supple no JVD, no mass.  Chest: Clear to auscultation bilaterally.  CVS: S1, S2 no murmurs, no S3.Regular rate.  ABD: Soft non tender.   Ext: No edema  MS: Adequate ROM spine, shoulders, hips and knees.  Skin: Intact, no ulcerations or rash noted.  Psych: Good eye contact, normal affect. Memory intact mildly  anxious and  depressed appearing.  CNS: CN 2-12 intact, power,  normal throughout.no focal deficits noted.        Assessment & Plan:  HYPERTENSION Uncontrolled,  medication changed, close f/u in 4 weeks DASH diet and commitment to daily physical activity for a minimum of 30 minutes discussed and encouraged, as a part of hypertension management. The importance of attaining a healthy weight is also discussed.   Type 2 diabetes, diet controlled Controlled, no change in medication Patient advised to reduce carb and sweets, and no need for medication at this time  Hyperlipidemia LDL goal <100 Uncontrolled Hyperlipidemia:Low fat diet discussed and encouraged.  Pt needs to start medication  DEPRESSION Unexpected recent loss of child. Needs to start medication and therapy offered  HYPOTHYROIDISM Controlled, no change in medication

## 2013-11-05 NOTE — Assessment & Plan Note (Signed)
Uncontrolled, medication changed, close f/u in 4 weeks DASH diet and commitment to daily physical activity for a minimum of 30 minutes discussed and encouraged, as a part of hypertension management. The importance of attaining a healthy weight is also discussed.

## 2013-11-05 NOTE — Assessment & Plan Note (Signed)
Controlled, no change in medication Patient advised to reduce carb and sweets, and no need for medication at this time

## 2013-11-06 ENCOUNTER — Telehealth: Payer: Self-pay | Admitting: Family Medicine

## 2013-11-06 NOTE — Telephone Encounter (Signed)
Noted  

## 2013-11-21 ENCOUNTER — Other Ambulatory Visit: Payer: Self-pay | Admitting: Family Medicine

## 2014-01-01 ENCOUNTER — Encounter: Payer: Self-pay | Admitting: Family Medicine

## 2014-01-01 ENCOUNTER — Ambulatory Visit (INDEPENDENT_AMBULATORY_CARE_PROVIDER_SITE_OTHER): Payer: Medicare Other | Admitting: Family Medicine

## 2014-01-01 VITALS — BP 128/80 | HR 67 | Resp 16 | Ht 65.0 in | Wt 216.8 lb

## 2014-01-01 DIAGNOSIS — B356 Tinea cruris: Secondary | ICD-10-CM | POA: Insufficient documentation

## 2014-01-01 DIAGNOSIS — E039 Hypothyroidism, unspecified: Secondary | ICD-10-CM

## 2014-01-01 DIAGNOSIS — A6004 Herpesviral vulvovaginitis: Secondary | ICD-10-CM

## 2014-01-01 DIAGNOSIS — E119 Type 2 diabetes mellitus without complications: Secondary | ICD-10-CM

## 2014-01-01 DIAGNOSIS — E785 Hyperlipidemia, unspecified: Secondary | ICD-10-CM

## 2014-01-01 DIAGNOSIS — F3289 Other specified depressive episodes: Secondary | ICD-10-CM

## 2014-01-01 DIAGNOSIS — N760 Acute vaginitis: Secondary | ICD-10-CM

## 2014-01-01 DIAGNOSIS — I10 Essential (primary) hypertension: Secondary | ICD-10-CM

## 2014-01-01 DIAGNOSIS — F329 Major depressive disorder, single episode, unspecified: Secondary | ICD-10-CM

## 2014-01-01 LAB — LIPID PANEL
Cholesterol: 167 mg/dL (ref 0–200)
HDL: 45 mg/dL (ref >39)
LDL Cholesterol: 99 mg/dL (ref 0–99)
Total CHOL/HDL Ratio: 3.7 Ratio
Triglycerides: 117 mg/dL (ref <150)
VLDL: 23 mg/dL (ref 0–40)

## 2014-01-01 LAB — COMPLETE METABOLIC PANEL WITH GFR
ALT: 17 U/L (ref 0–35)
AST: 21 U/L (ref 0–37)
Albumin: 4.1 g/dL (ref 3.5–5.2)
Alkaline Phosphatase: 54 U/L (ref 39–117)
BUN: 27 mg/dL — ABNORMAL HIGH (ref 6–23)
CO2: 21 mEq/L (ref 19–32)
Calcium: 9.3 mg/dL (ref 8.4–10.5)
Chloride: 108 mEq/L (ref 96–112)
Creat: 1.65 mg/dL — ABNORMAL HIGH (ref 0.50–1.10)
GFR, Est African American: 32 mL/min — ABNORMAL LOW
GFR, Est Non African American: 28 mL/min — ABNORMAL LOW
Glucose, Bld: 86 mg/dL (ref 70–99)
Potassium: 4.6 mEq/L (ref 3.5–5.3)
Sodium: 138 mEq/L (ref 135–145)
Total Bilirubin: 0.5 mg/dL (ref 0.2–1.2)
Total Protein: 7.2 g/dL (ref 6.0–8.3)

## 2014-01-01 LAB — TSH: TSH: 4.014 u[IU]/mL (ref 0.350–4.500)

## 2014-01-01 MED ORDER — TERBINAFINE HCL 250 MG PO TABS: 250.0000 mg | ORAL_TABLET | Freq: Every day | ORAL | Status: DC

## 2014-01-01 NOTE — Assessment & Plan Note (Signed)
Controlled, no change in medication  

## 2014-01-01 NOTE — Assessment & Plan Note (Signed)
Adequately treated on curren medication no change in dose

## 2014-01-01 NOTE — Assessment & Plan Note (Signed)
visial pelvic exam today negative for ulceration, excessive discharge, malodour or ulceration. Vaginal mucosa grossly appears normal, but gyne exam needed due to chronic c/o itch which is worsening

## 2014-01-01 NOTE — Patient Instructions (Signed)
F/u as before  You are referred to [facility] re chief problem today, soonest available.  It is VITAL that you take the acyclovir Three times daily as prescribed, please fill today, I am concerned , since the bottle you have with you is since 2014 and has wrong directions. We a re re sending today  Please reconsider the flu vaccine  Labs today and not in December are lipid, cmp and EGFr, hBA1C and TSH  Blood pressure is excellent.  No significant leg swelling on exam

## 2014-01-01 NOTE — Assessment & Plan Note (Addendum)
Updated lab today. Patient educated about the importance of limiting  Carbohydrate intake , the need to commit to daily physical activity . The fact that changes in all these areas will reduce or eliminate all together the nned to take medication for blood sugar control

## 2014-01-01 NOTE — Progress Notes (Signed)
   Subjective:    Patient ID: Linda Richardson, female    DOB: 1923/08/25, 80 y.o.   MRN: [id number]  HPI The PT is here for follow up and re-evaluation of chronic medical conditions, medication management and review of any available recent lab and radiology data.  Preventive health is updated, specifically  Cancer screening and Immunization.  Refuses flu vaccine today, will think about it. Sees ortho every 2 months, for pain management of lef hip and knee arthritis, reports taking one tablet daily The PT denies any adverse reactions to current medications since the last visit.  C/o worsening and persistent vaginal itch, despite treatment C/o mild swelling of dorsum of feet this morning only, had on heels at [facility] yesterday       Review of Systems See HPI Denies recent fever or chills. Denies sinus pressure, nasal congestion, ear pain or sore throat. Denies chest congestion, productive cough or wheezing. Denies chest pains, palpitations and leg swelling Denies abdominal pain, nausea, vomiting,diarrhea or constipation.   Denies dysuria, frequency, hesitancy or incontinence.  Denies headaches, seizures, numbness, or tingling. Denies depression, anxiety or insomnia. .         Objective:   Physical Exam BP 128/80  Pulse 67  Resp 16  Ht 5\' 5"  (1.651 m)  Wt 216 lb 12.8 oz (98.34 kg)  BMI 36.08 kg/m2  SpO2 95% Patient alert and oriented and in no cardiopulmonary distress.  HEENT: No facial asymmetry, EOMI,   oropharynx pink and moist.  Neck supple no JVD, no mass.  Chest: Clear to auscultation bilaterally.  CVS: S1, S2 no murmurs, no S3.Regular rate.  ABD: Soft non tender.  Pelvic: fungal rash in right groin in area of greatest concern, no skin breakdown or bacterial superinfection noted Vaginal mucosa appears intact, no discharge noted, no ulceration noted, no mass notred Ext: No edema  MS: Adequate though reduced  ROM spine, shoulders, hips and knees.  Skin:  Intact, no ulcerations or rash noted.  Psych: Good eye contact, normal affect. Memory intact not anxious or depressed appearing.  CNS: CN 2-12 intact, power,  normal throughout.no focal deficits noted.        Assessment & Plan:  Tinea cruris Erythema and hypopigmentation in right groin in area of concern. Terbinafine x 1 week prescribed, will still have gyne eval    Vaginitis and vulvovaginitis visial pelvic exam today negative for ulceration, excessive discharge, malodour or ulceration. 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 Patient educated about the importance of limiting  Carbohydrate intake , the need to commit to daily physical activity . The fact that changes in all these areas will reduce or eliminate all together the nned to take medication for blood sugar control     HYPOTHYROIDISM Updated lab today, med adjustment if needed , to follow, last lab showed control  HYPERTENSION Controlled, no change in medication

## 2014-01-01 NOTE — Assessment & Plan Note (Signed)
Erythema and hypopigmentation in right groin in area of concern. Terbinafine x 1 week prescribed, will still have gyne eval

## 2014-01-01 NOTE — Assessment & Plan Note (Signed)
Updated lab today, med adjustment if needed , to follow, last lab showed control

## 2014-01-01 NOTE — Assessment & Plan Note (Signed)
Unclear as to medciation compliance , as bottle pthas of acyclovir is dated from 02/2013, I explained the need to take daily to help vaginal itch

## 2014-01-02 LAB — HEMOGLOBIN A1C
Hgb A1c MFr Bld: 6.4 % — ABNORMAL HIGH (ref <5.7)
Mean Plasma Glucose: 137 mg/dL — ABNORMAL HIGH (ref <117)

## 2014-01-04 NOTE — Addendum Note (Signed)
Addended by: Denman George B on: 01/04/2014 04:43 PM   Modules accepted: Orders

## 2014-01-10 ENCOUNTER — Telehealth: Payer: Self-pay | Admitting: Family Medicine

## 2014-01-10 NOTE — Telephone Encounter (Signed)
noted 

## 2014-01-15 ENCOUNTER — Telehealth: Payer: Self-pay | Admitting: Family Medicine

## 2014-01-15 NOTE — Telephone Encounter (Signed)
Patient already aware that we don't prescribe. She's sees Dr Luna Glasgow already and will ask him about it

## 2014-01-15 NOTE — Telephone Encounter (Signed)
Pls let pt and supplier , [facility] know that I do not prescribe orthopedic equipment, (bracves etc, there request is in t your area to be faxed back also If pt feels she needs this refer to ortho of her choice for further eval pls

## 2014-01-27 ENCOUNTER — Other Ambulatory Visit: Payer: Self-pay | Admitting: Family Medicine

## 2014-01-30 ENCOUNTER — Other Ambulatory Visit: Payer: Self-pay | Admitting: Family Medicine

## 2014-02-02 LAB — BASIC METABOLIC PANEL
BUN: 22 mg/dL (ref 6–23)
CO2: 19 mEq/L (ref 19–32)
Calcium: 9.3 mg/dL (ref 8.4–10.5)
Chloride: 111 mEq/L (ref 96–112)
Creat: 1.53 mg/dL — ABNORMAL HIGH (ref 0.50–1.10)
Glucose, Bld: 92 mg/dL (ref 70–99)
Potassium: 4 mEq/L (ref 3.5–5.3)
Sodium: 139 mEq/L (ref 135–145)

## 2014-02-13 ENCOUNTER — Other Ambulatory Visit: Payer: Self-pay | Admitting: Family Medicine

## 2014-03-03 ENCOUNTER — Other Ambulatory Visit: Payer: Self-pay | Admitting: Family Medicine

## 2014-03-26 ENCOUNTER — Encounter: Payer: Self-pay | Admitting: Family Medicine

## 2014-03-26 ENCOUNTER — Ambulatory Visit (INDEPENDENT_AMBULATORY_CARE_PROVIDER_SITE_OTHER): Payer: Medicare Other | Admitting: Family Medicine

## 2014-03-26 VITALS — BP 140/80 | HR 83 | Resp 16 | Ht 65.0 in | Wt 223.4 lb

## 2014-03-26 DIAGNOSIS — E039 Hypothyroidism, unspecified: Secondary | ICD-10-CM

## 2014-03-26 DIAGNOSIS — E785 Hyperlipidemia, unspecified: Secondary | ICD-10-CM

## 2014-03-26 DIAGNOSIS — Z1211 Encounter for screening for malignant neoplasm of colon: Secondary | ICD-10-CM

## 2014-03-26 DIAGNOSIS — E119 Type 2 diabetes mellitus without complications: Secondary | ICD-10-CM

## 2014-03-26 DIAGNOSIS — Z0000 Encounter for general adult medical examination without abnormal findings: Secondary | ICD-10-CM

## 2014-03-26 LAB — HEMOCCULT GUIAC POC 1CARD (OFFICE): Fecal Occult Blood, POC: NEGATIVE

## 2014-03-26 MED ORDER — METOPROLOL SUCCINATE ER 50 MG PO TB24: ORAL_TABLET | ORAL | Status: DC

## 2014-03-26 MED ORDER — AMLODIPINE BESYLATE 5 MG PO TABS: 5.0000 mg | ORAL_TABLET | Freq: Every day | ORAL | Status: DC

## 2014-03-26 MED ORDER — LEVOTHYROXINE SODIUM 25 MCG PO TABS: ORAL_TABLET | ORAL | Status: DC

## 2014-03-26 MED ORDER — CITALOPRAM HYDROBROMIDE 20 MG PO TABS: ORAL_TABLET | ORAL | Status: DC

## 2014-03-26 NOTE — Progress Notes (Signed)
Subjective:    Patient ID: Linda Richardson, female    DOB: 12-19-1923, 80 y.o.   MRN: [id number]  HPI Preventive Screening-Counseling & Management   Patient present here today for a Medicare annual wellness visit. Rectal exam done as screen for colon cancer. Influenza vaccination offered and pt declined. Referred for diabetic eye exam , which is past due    Current Problems (verified)   Medications Prior to Visit Allergies (verified)   PAST HISTORY  Family History (verified)   Social History widowed, lives alone. Had 2 sons to pass away, has 2 daughters living . Lives alone    Risk Factors  Current exercise habits:  Tries to walk when able and does stretches at home, sometimes rides stationary bike   Dietary issues discussed: Heart healthy diet discussed, eat more fruits and vegetables and limit red meat and carbs. Eat more chicken fish and [place]    Cardiac risk factors: diabetes  Depression Screen  (Note: if answer to either of the following is "Yes", a more complete depression screening is indicated)   Over the past two weeks, have you felt down, depressed or hopeless? No  Over the past two weeks, have you felt little interest or pleasure in doing things? No  Have you lost interest or pleasure in daily life? No  Do you often feel hopeless? No  Do you cry easily over simple problems? No   Activities of Daily Living  In your present state of health, do you have any difficulty performing the following activities?  Driving?: doesn't drive, her daughter brings her  Managing money?: No Feeding yourself?:No Getting from bed to chair?:yes at times due to arthritis Climbing a flight of stairs?:yes  due to arthritis Preparing food and eating?:No Bathing or showering?:No Getting dressed?:No Getting to the toilet?:No Using the toilet?:No Moving around from place to place?: rarely if experiencing arthritis flare  Fall Risk Assessment In the past year have you fallen  or had a near fall?:No Are you currently taking any medications that make you dizzy?:No   Hearing Difficulties: No Do you often ask people to speak up or repeat themselves?: sometimes Do you experience ringing or noises in your ears?:No Do you have difficulty understanding soft or whispered voices?:sometimes (in right ear),does not want furhter eval at this time   Cognitive Testing  Alert? Yes Normal Appearance?Yes  Oriented to person? Yes Place? Yes  Time? Yes  Displays appropriate judgment?Yes  Can read the correct time from a watch face? yes Are you having problems remembering things?No  Advanced Directives have been discussed with the patient?Yes, Brochure given ,pt uncertain as to what she wants even as far as intubation at this time  List the Names of Other Physician/Practitioners you currently use:  Dr Luna Glasgow (ortho) Dr Adah Perl (OBGYN)   Indicate any recent Medical Services you may have received from other than [facility] providers in the past year (date may be approximate).   Assessment:    Annual Wellness Exam   Plan:     Medicare Attestation  I have personally reviewed:  The patient's medical and social history  Their use of alcohol, tobacco or illicit drugs  Their current medications and supplements  The patient's functional ability including ADLs,fall risks, home safety risks, cognitive, and hearing and visual impairment  Diet and physical activities  Evidence for depression or mood disorders  The patient's weight, height, BMI, and visual acuity have been recorded in the chart. I have made referrals, counseling, and provided  education to the patient based on review of the above and I have provided the patient with a written personalized care plan for preventive services.      Review of Systems     Objective:   Physical Exam  BP 140/80 mmHg  Pulse 83  Resp 16  Ht 5\' 5"  (1.651 m)  Wt 223 lb 6.4 oz (101.334 kg)  BMI 37.18 kg/m2  SpO2 94%   Rectal exam:  no mass, heme negative stool      Assessment & Plan:  Medicare annual wellness visit, subsequent Annual exam as documented. Counseling done  re healthy lifestyle involving commitment to daily exercise , as able, heart healthy diet, and attaining healthy weight.The importance of adequate sleep also discussed. Regular seat belt use and home safety, is also discussed.  Immunization and cancer screening needs are specifically addressed at this visit.refuses flu vaccine offered , which is due.   Special screening for malignant neoplasms, colon Heme negative stool, and no mass palpated or seen on exam

## 2014-03-26 NOTE — Patient Instructions (Addendum)
Rectal exam today is good  Blood work needs to be done week of 12/28 please. Fasting lipid, cmp and eGFr, hBA1C and TSH   You are referred for eye exam, with Dr Radford Pax, this is past due    For leg swelling , please keep legs elevated as much as possible, reduce salt intake , and also wear strong support hose   We will miss you!  All the best!

## 2014-04-01 ENCOUNTER — Encounter: Payer: Self-pay | Admitting: Family Medicine

## 2014-04-01 DIAGNOSIS — Z0000 Encounter for general adult medical examination without abnormal findings: Secondary | ICD-10-CM | POA: Insufficient documentation

## 2014-04-01 DIAGNOSIS — Z7689 Persons encountering health services in other specified circumstances: Secondary | ICD-10-CM | POA: Insufficient documentation

## 2014-04-01 DIAGNOSIS — Z1211 Encounter for screening for malignant neoplasm of colon: Secondary | ICD-10-CM | POA: Insufficient documentation

## 2014-04-01 NOTE — Assessment & Plan Note (Signed)
Heme negative stool, and no mass palpated or seen on exam

## 2014-04-01 NOTE — Assessment & Plan Note (Signed)
Annual exam as documented. Counseling done  re healthy lifestyle involving commitment to daily exercise , as able, heart healthy diet, and attaining healthy weight.The importance of adequate sleep also discussed. Regular seat belt use and home safety, is also discussed.  Immunization and cancer screening needs are specifically addressed at this visit.refuses flu vaccine offered , which is due.

## 2014-04-17 ENCOUNTER — Other Ambulatory Visit: Payer: Self-pay | Admitting: Family Medicine

## 2014-04-24 ENCOUNTER — Telehealth: Payer: Self-pay | Admitting: Family Medicine

## 2014-04-24 MED ORDER — SPIRONOLACTONE 50 MG PO TABS: ORAL_TABLET | ORAL | Status: DC

## 2014-04-24 MED ORDER — METOPROLOL SUCCINATE ER 50 MG PO TB24: ORAL_TABLET | ORAL | Status: DC

## 2014-04-24 MED ORDER — PRAVASTATIN SODIUM 20 MG PO TABS: 20.0000 mg | ORAL_TABLET | Freq: Every day | ORAL | Status: AC

## 2014-04-24 MED ORDER — CITALOPRAM HYDROBROMIDE 20 MG PO TABS: ORAL_TABLET | ORAL | Status: AC

## 2014-04-24 MED ORDER — ACYCLOVIR 400 MG PO TABS: ORAL_TABLET | ORAL | Status: DC

## 2014-04-24 MED ORDER — LEVOTHYROXINE SODIUM 25 MCG PO TABS: ORAL_TABLET | ORAL | Status: DC

## 2014-04-24 MED ORDER — AMLODIPINE BESYLATE 5 MG PO TABS: 5.0000 mg | ORAL_TABLET | Freq: Every day | ORAL | Status: AC

## 2014-04-24 NOTE — Telephone Encounter (Signed)
1 month only sent

## 2014-05-01 ENCOUNTER — Other Ambulatory Visit: Payer: Self-pay | Admitting: Family Medicine

## 2014-06-07 ENCOUNTER — Other Ambulatory Visit: Payer: Self-pay | Admitting: Family Medicine

## 2014-07-03 ENCOUNTER — Other Ambulatory Visit: Payer: Self-pay | Admitting: Family Medicine

## 2014-07-16 ENCOUNTER — Other Ambulatory Visit: Payer: Self-pay | Admitting: Family Medicine

## 2014-07-26 ENCOUNTER — Other Ambulatory Visit: Payer: Self-pay | Admitting: Family Medicine

## 2015-05-30 ENCOUNTER — Other Ambulatory Visit: Payer: Self-pay | Admitting: *Deleted

## 2015-05-30 MED ORDER — CYCLOBENZAPRINE HCL 10 MG PO TABS: 10.0000 mg | ORAL_TABLET | ORAL | Status: DC

## 2015-07-16 ENCOUNTER — Ambulatory Visit (INDEPENDENT_AMBULATORY_CARE_PROVIDER_SITE_OTHER): Payer: Medicare (Managed Care) | Admitting: Orthopaedic Surgery

## 2015-07-16 VITALS — BP 128/73 | HR 62 | Temp 97.2°F | Resp 16 | Ht 65.0 in | Wt 219.0 lb

## 2015-07-16 DIAGNOSIS — M545 Low back pain, unspecified: Secondary | ICD-10-CM

## 2015-07-16 MED ORDER — HYDROCODONE-ACETAMINOPHEN 7.5-325 MG PO TABS: 1.0000 | ORAL_TABLET | ORAL | Status: DC | PRN

## 2015-07-16 NOTE — Progress Notes (Signed)
Patient [id number] Linda Richardson, female DOB:January 17, 1924, 82 y.o. [id number]  Chief Complaint  Patient presents with  . Follow-up    follow up right hip pain "better"    HPI  Linda Richardson is a 82 y.o. female who has chronic lower back pain.  She has some right hip pain at times.  She got a new mattress last weekend and does not like it.  She has had three epidurals in the last several months.  She has no paresthesias.  She has no new trauma.  Back Pain This is a chronic problem. The current episode started more than 1 year ago. The problem occurs daily. The problem has been waxing and waning since onset. The pain is present in the lumbar spine. The quality of the pain is described as aching. The pain is at a severity of 3/10. The pain is mild. The pain is worse during the day. The symptoms are aggravated by bending, stress and twisting. Pertinent negatives include no chest pain. She has tried analgesics, bed rest, NSAIDs, walking, ice and heat for the symptoms. The treatment provided moderate relief.    Body mass index is 36.44 kg/(m^2).   Review of Systems  Constitutional:       Patient has Diabetes Mellitus. Patient has hypertension. Patient does not have COPD or shortness of breath. Patient has BMI > 35. Patient does not have current smoking history.  HENT: Negative for congestion.   Respiratory: Negative for cough and shortness of breath.   Cardiovascular: Negative for chest pain and leg swelling.  Endocrine: Positive for cold intolerance.  Musculoskeletal: Positive for myalgias, back pain and arthralgias.  Allergic/Immunologic: Negative for environmental allergies.  Psychiatric/Behavioral: The patient is nervous/anxious.     Past Medical History  Diagnosis Date  . Bladder cancer   . Obesity   . Dysuria-frequency syndrome   . Anxiety   . Hypothyroidism   . Diabetes   . Hypertension   . Hyperlipidemia   . Glaucoma     2006    Past Surgical History  Procedure  Laterality Date  . Vesicovaginal fistula closure w/ tah    . Surgery on bladder for cancer    . Dilation and curettage of uterus      x3  . Bilateral cataract surgery      2004  . Abdominal hysterectomy      Family History  Problem Relation Age of Onset  . Stroke Mother 17  . Diabetes Sister   . Hypertension Sister     Social History Social History  Substance Use Topics  . Smoking status: Former Research scientist (life sciences)  . Smokeless tobacco: Not on file  . Alcohol Use: No    Allergies  Allergen Reactions  . Avapro [Irbesartan]     Dry cough  . Penicillins   . Sulfonamide Derivatives     Current Outpatient Prescriptions  Medication Sig Dispense Refill  . acyclovir (ZOVIRAX) 400 MG tablet TAKE 1 TABLET BY MOUTH 3 TIMES DAILY. 21 tablet 0  . amLODipine (NORVASC) 5 MG tablet Take 1 tablet (5 mg total) by mouth daily. 30 tablet 0  . aspirin 81 MG tablet Take 81 mg by mouth daily.    . brimonidine (ALPHAGAN P) 0.1 % SOLN Place 1 drop into both eyes 2 (two) times daily. One drop each eye twice daily as needed     . calcium-vitamin D (OSCAL 500/200 D-3) 500-200 MG-UNIT per tablet Take 1 tablet by mouth 2 (two) times daily. 180 tablet 3  .  citalopram (CELEXA) 20 MG tablet TAKE (1) TABLET BY MOUTH ONCE DAILY. 30 tablet 0  . cyclobenzaprine (FLEXERIL) 10 MG tablet Take 1 tablet (10 mg total) by mouth as directed. 60 tablet 2  . HYDROcodone-acetaminophen (NORCO) 7.5-325 MG tablet Take 1 tablet by mouth every 4 (four) hours as needed for moderate pain (Must last 30 days.  Do not drive or operate machinery while taking this medicine.). 120 tablet 0  . ibuprofen (ADVIL,MOTRIN) 800 MG tablet Take 800 mg by mouth 3 (three) times daily.    Marland Kitchen levothyroxine (SYNTHROID, LEVOTHROID) 25 MCG tablet TAKE (1) TABLET BY MOUTH ONCE DAILY. 30 tablet 0  . loteprednol (LOTEMAX) 0.5 % ophthalmic suspension Place 1 drop into the left eye 4 (four) times daily.    . metoprolol succinate (TOPROL-XL) 50 MG 24 hr tablet TAKE  1 TABLET ONCE A DAY AFTER A MEAL. 30 tablet 0  . Multiple Vitamin (MULTIVITAMIN) tablet Take 1 tablet by mouth daily.    . pravastatin (PRAVACHOL) 20 MG tablet Take 1 tablet (20 mg total) by mouth daily. 30 tablet 0  . spironolactone (ALDACTONE) 50 MG tablet TAKE (1) TABLET TWICE DAILY. 60 tablet 0  . terbinafine (LAMISIL) 250 MG tablet Take 1 tablet (250 mg total) by mouth daily. 7 tablet 0  . vitamin B-12 (CYANOCOBALAMIN) 1000 MCG tablet Take 1,000 mcg by mouth daily.     No current facility-administered medications for this visit.     Physical Exam  Blood pressure 128/73, pulse 62, temperature 97.2 F (36.2 C), resp. rate 16, height 5\' 5"  (1.651 m), weight 219 lb (99.338 kg).  Constitutional: overall normal hygiene, normal nutrition, well developed, normal grooming, normal body habitus. Assistive device:none  Musculoskeletal: gait and station Limp none, muscle tone and strength are normal, no tremors or atrophy is present.  .  Neurological: coordination overall normal.  Deep tendon reflex/nerve stretch intact.  Sensation normal.  Cranial nerves II-XII intact.   Skin:   normal overall no scars, lesions, ulcers or rashes. No psoriasis.  Psychiatric: Alert and oriented x 3.  Recent memory intact, remote memory unclear.  Normal mood and affect. Well groomed.  Good eye contact.  Cardiovascular: overall no swelling, no varicosities, no edema bilaterally, normal temperatures of the legs and arms, no clubbing, cyanosis and good capillary refill.  Lymphatic: palpation is normal.  Spine/Pelvis examination:  Inspection:  Overall, sacoiliac joint benign and hips nontender; without crepitus or defects.   Thoracic spine inspection: Alignment normal without kyphosis present   Lumbar spine inspection:  Alignment  with normal lumbar lordosis, without scoliosis apparent.   Thoracic spine palpation:  without tenderness of spinal processes   Lumbar spine palpation: with tenderness of lumbar  area; without tightness of lumbar muscles    Range of Motion:   Lumbar flexion, forward flexion is 35  without pain or tenderness    Lumbar extension is 10  without pain or tenderness   Left lateral bend is Normal  without pain or tenderness   Right lateral bend is Normal without pain or tenderness   Straight leg raising is Normal   Strength & tone: Normal   Stability overall normal stability     The patient has been educated about the nature of the problem(s) and counseled on treatment options.  The patient appeared to understand what I have discussed and is in agreement with it.  Encounter Diagnosis  Name Primary?  . Midline low back pain without sciatica Yes   I refilled her pain  medicine today with precautions.  Her diabetes is well controlled and she closely watches her blood sugars.  Her hypertension is also well controlled.  She watches her salt intake and has no distal edema.  She is active.  PLAN Call if any problems.  Precautions discussed.  Continue current medications.   Return to clinic 2 months

## 2015-08-15 ENCOUNTER — Telehealth: Payer: Self-pay | Admitting: Orthopaedic Surgery

## 2015-08-15 MED ORDER — HYDROCODONE-ACETAMINOPHEN 7.5-325 MG PO TABS: 1.0000 | ORAL_TABLET | ORAL | Status: DC | PRN

## 2015-08-15 NOTE — Telephone Encounter (Signed)
Hydrocodone-Acetaminophen 7.5/325mg Qty 120 Tablets °

## 2015-08-15 NOTE — Telephone Encounter (Signed)
Rx done. 

## 2015-09-17 ENCOUNTER — Encounter: Payer: Self-pay | Admitting: Orthopaedic Surgery

## 2015-09-17 ENCOUNTER — Ambulatory Visit (INDEPENDENT_AMBULATORY_CARE_PROVIDER_SITE_OTHER): Payer: Medicare (Managed Care) | Admitting: Orthopaedic Surgery

## 2015-09-17 VITALS — BP 155/73 | HR 88 | Temp 97.0°F | Ht 65.0 in | Wt 213.0 lb

## 2015-09-17 DIAGNOSIS — L03116 Cellulitis of left lower limb: Secondary | ICD-10-CM

## 2015-09-17 DIAGNOSIS — E119 Type 2 diabetes mellitus without complications: Secondary | ICD-10-CM

## 2015-09-17 DIAGNOSIS — I10 Essential (primary) hypertension: Secondary | ICD-10-CM

## 2015-09-17 MED ORDER — HYDROCODONE-ACETAMINOPHEN 7.5-325 MG PO TABS: 1.0000 | ORAL_TABLET | ORAL | Status: DC | PRN

## 2015-09-17 MED ORDER — CEPHALEXIN 500 MG PO CAPS: 500.0000 mg | ORAL_CAPSULE | Freq: Four times a day (QID) | ORAL | Status: DC

## 2015-09-17 NOTE — Progress Notes (Signed)
Patient Linda Richardson, female DOB:20-Nov-1923, 82 y.o. [id number]  Chief Complaint  Patient presents with  . Follow-up    Left hip pain    HPI  Linda Richardson is a 82 y.o. female who has chronic left hip pain presents with a new problem today:  She bumped her lower leg on a piece of furniture about five days ago.  She has developed lateral swelling of the left lower leg with some early redness.  She has an abrasion over the area but no discharge. She has no fever.  She has no other trauma.  It is getting more tender and more painful.  she is concerned about the recent change in color to reddish tint.  She has used heat and Neosporin with limited help.   HPI  Body mass index is 35.44 kg/(m^2).  ROS  Review of Systems  Constitutional:       Patient has Diabetes Mellitus. Patient has hypertension. Patient does not have COPD or shortness of breath. Patient has BMI > 35. Patient does not have current smoking history.  HENT: Negative for congestion.   Respiratory: Negative for cough and shortness of breath.   Cardiovascular: Negative for chest pain and leg swelling.  Endocrine: Positive for cold intolerance.  Musculoskeletal: Positive for myalgias, back pain and arthralgias.  Allergic/Immunologic: Negative for environmental allergies.  Psychiatric/Behavioral: The patient is nervous/anxious.     Past Medical History  Diagnosis Date  . Bladder cancer ([place])   . Obesity   . Dysuria-frequency syndrome   . Anxiety   . Hypothyroidism   . Diabetes ([place])   . Hypertension   . Hyperlipidemia   . Glaucoma     2006    Past Surgical History  Procedure Laterality Date  . Vesicovaginal fistula closure w/ tah    . Surgery on bladder for cancer    . Dilation and curettage of uterus      x3  . Bilateral cataract surgery      2004  . Abdominal hysterectomy      Family History  Problem Relation Age of Onset  . Stroke Mother 45  . Diabetes Sister   . Hypertension Sister      Social History Social History  Substance Use Topics  . Smoking status: Former Research scientist (life sciences)  . Smokeless tobacco: None  . Alcohol Use: No    Allergies  Allergen Reactions  . Avapro [Irbesartan]     Dry cough  . Penicillins   . Sulfonamide Derivatives     Current Outpatient Prescriptions  Medication Sig Dispense Refill  . acyclovir (ZOVIRAX) 400 MG tablet TAKE 1 TABLET BY MOUTH 3 TIMES DAILY. 21 tablet 0  . amLODipine (NORVASC) 5 MG tablet Take 1 tablet (5 mg total) by mouth daily. 30 tablet 0  . aspirin 81 MG tablet Take 81 mg by mouth daily.    . brimonidine (ALPHAGAN P) 0.1 % SOLN Place 1 drop into both eyes 2 (two) times daily. One drop each eye twice daily as needed     . citalopram (CELEXA) 20 MG tablet TAKE (1) TABLET BY MOUTH ONCE DAILY. 30 tablet 0  . cyclobenzaprine (FLEXERIL) 10 MG tablet Take 1 tablet (10 mg total) by mouth as directed. 60 tablet 2  . HYDROcodone-acetaminophen (NORCO) 7.5-325 MG tablet Take 1 tablet by mouth every 4 (four) hours as needed for moderate pain (Must last 30 days.  Do not drive or operate machinery while taking this medicine.). 120 tablet 0  . ibuprofen (ADVIL,MOTRIN)  800 MG tablet Take 800 mg by mouth 3 (three) times daily.    Marland Kitchen levothyroxine (SYNTHROID, LEVOTHROID) 25 MCG tablet TAKE (1) TABLET BY MOUTH ONCE DAILY. 30 tablet 0  . loteprednol (LOTEMAX) 0.5 % ophthalmic suspension Place 1 drop into the left eye 4 (four) times daily.    . metoprolol succinate (TOPROL-XL) 50 MG 24 hr tablet TAKE 1 TABLET ONCE A DAY AFTER A MEAL. 30 tablet 0  . Multiple Vitamin (MULTIVITAMIN) tablet Take 1 tablet by mouth daily.    . pravastatin (PRAVACHOL) 20 MG tablet Take 1 tablet (20 mg total) by mouth daily. 30 tablet 0  . spironolactone (ALDACTONE) 50 MG tablet TAKE (1) TABLET TWICE DAILY. 60 tablet 0  . terbinafine (LAMISIL) 250 MG tablet Take 1 tablet (250 mg total) by mouth daily. 7 tablet 0  . vitamin B-12 (CYANOCOBALAMIN) 1000 MCG tablet Take 1,000 mcg  by mouth daily.    . calcium-vitamin D (OSCAL 500/200 D-3) 500-200 MG-UNIT per tablet Take 1 tablet by mouth 2 (two) times daily. 180 tablet 3  . cephALEXin (KEFLEX) 500 MG capsule Take 1 capsule (500 mg total) by mouth 4 (four) times daily. 40 capsule 0   No current facility-administered medications for this visit.     Physical Exam  Blood pressure 155/73, pulse 88, temperature 97 F (36.1 C), height 5\' 5"  (1.651 m), weight 213 lb (96.616 kg).  Constitutional: overall normal hygiene, normal nutrition, well developed, normal grooming, normal body habitus. Assistive device:none  Musculoskeletal: gait and station Limp left, muscle tone and strength are normal, no tremors or atrophy is present.  .  Neurological: coordination overall normal.  Deep tendon reflex/nerve stretch intact.  Sensation normal.  Cranial nerves II-XII intact.   Skin:   The left lateral leg has abrasions of the mid portion of the lower leg with swelling/hematoma and slight redness, otherwise overall no scars, lesions, ulcers or rashes. No psoriasis.  Psychiatric: Alert and oriented x 3.  Recent memory intact, remote memory unclear.  Normal mood and affect. Well groomed.  Good eye contact.  Cardiovascular: overall no swelling, no varicosities, no edema bilaterally, normal temperatures of the legs and arms, no clubbing, cyanosis and good capillary refill.  Lymphatic: palpation is normal.   As noted above under skin she has swelling and hematoma and early redness of the left lateral lower leg.  NV is intact.   I will give Keflex.  I have told her about elevation and ice.   The patient has been educated about the nature of the problem(s) and counseled on treatment options.  The patient appeared to understand what I have discussed and is in agreement with it.  Encounter Diagnoses  Name Primary?  . Cellulitis of left lower leg Yes  . Essential hypertension   . Type 2 diabetes, diet controlled ([place])     PLAN Call  if any problems.  Precautions discussed.  Continue current medications.   Return to clinic 1 week   Call if the leg gets worse, more painful or more red.

## 2015-10-01 ENCOUNTER — Ambulatory Visit (INDEPENDENT_AMBULATORY_CARE_PROVIDER_SITE_OTHER): Payer: Medicare (Managed Care) | Admitting: Orthopaedic Surgery

## 2015-10-01 ENCOUNTER — Encounter: Payer: Self-pay | Admitting: Orthopaedic Surgery

## 2015-10-01 VITALS — BP 153/76 | HR 77 | Temp 97.2°F | Ht 64.0 in | Wt 210.4 lb

## 2015-10-01 DIAGNOSIS — L03116 Cellulitis of left lower limb: Secondary | ICD-10-CM

## 2015-10-01 DIAGNOSIS — I10 Essential (primary) hypertension: Secondary | ICD-10-CM | POA: Diagnosis not present

## 2015-10-01 DIAGNOSIS — E119 Type 2 diabetes mellitus without complications: Secondary | ICD-10-CM | POA: Diagnosis not present

## 2015-10-01 NOTE — Progress Notes (Signed)
Patient [id number] Linda Richardson, female DOB:04/16/1924, 82 y.o. [id number]  Chief Complaint  Patient presents with  . Follow-up    left leg pain    HPI  Linda Richardson is a 82 y.o. female who just turned 39 and does not look any older than 41.  Her left lower leg cellulitis is not red, has less pain, less swelling.  She is walking well now.  She has had some slight swelling of the left ankle.  The redness went away.  She is pleased with the progress.  HPI  Body mass index is 36.1 kg/(m^2).  ROS  Review of Systems  Constitutional:       Patient has Diabetes Mellitus. Patient has hypertension. Patient does not have COPD or shortness of breath. Patient has BMI > 35. Patient does not have current smoking history.  HENT: Negative for congestion.   Respiratory: Negative for cough and shortness of breath.   Cardiovascular: Negative for chest pain and leg swelling.  Endocrine: Positive for cold intolerance.  Musculoskeletal: Positive for myalgias, back pain and arthralgias.  Allergic/Immunologic: Negative for environmental allergies.  Psychiatric/Behavioral: The patient is nervous/anxious.     Past Medical History  Diagnosis Date  . Bladder cancer ([place])   . Obesity   . Dysuria-frequency syndrome   . Anxiety   . Hypothyroidism   . Diabetes ([place])   . Hypertension   . Hyperlipidemia   . Glaucoma     2006    Past Surgical History  Procedure Laterality Date  . Vesicovaginal fistula closure w/ tah    . Surgery on bladder for cancer    . Dilation and curettage of uterus      x3  . Bilateral cataract surgery      2004  . Abdominal hysterectomy      Family History  Problem Relation Age of Onset  . Stroke Mother 40  . Diabetes Sister   . Hypertension Sister     Social History Social History  Substance Use Topics  . Smoking status: Former Research scientist (life sciences)  . Smokeless tobacco: None  . Alcohol Use: No    Allergies  Allergen Reactions  . Avapro [Irbesartan]     Dry cough   . Penicillins   . Sulfonamide Derivatives     Current Outpatient Prescriptions  Medication Sig Dispense Refill  . acyclovir (ZOVIRAX) 400 MG tablet TAKE 1 TABLET BY MOUTH 3 TIMES DAILY. 21 tablet 0  . amLODipine (NORVASC) 5 MG tablet Take 1 tablet (5 mg total) by mouth daily. 30 tablet 0  . aspirin 81 MG tablet Take 81 mg by mouth daily.    . brimonidine (ALPHAGAN P) 0.1 % SOLN Place 1 drop into both eyes 2 (two) times daily. One drop each eye twice daily as needed     . cephALEXin (KEFLEX) 500 MG capsule Take 1 capsule (500 mg total) by mouth 4 (four) times daily. 40 capsule 0  . citalopram (CELEXA) 20 MG tablet TAKE (1) TABLET BY MOUTH ONCE DAILY. 30 tablet 0  . cyclobenzaprine (FLEXERIL) 10 MG tablet Take 1 tablet (10 mg total) by mouth as directed. 60 tablet 2  . HYDROcodone-acetaminophen (NORCO) 7.5-325 MG tablet Take 1 tablet by mouth every 4 (four) hours as needed for moderate pain (Must last 30 days.  Do not drive or operate machinery while taking this medicine.). 120 tablet 0  . ibuprofen (ADVIL,MOTRIN) 800 MG tablet Take 800 mg by mouth 3 (three) times daily.    Marland Kitchen levothyroxine (SYNTHROID, LEVOTHROID)  25 MCG tablet TAKE (1) TABLET BY MOUTH ONCE DAILY. 30 tablet 0  . loteprednol (LOTEMAX) 0.5 % ophthalmic suspension Place 1 drop into the left eye 4 (four) times daily.    . metoprolol succinate (TOPROL-XL) 50 MG 24 hr tablet TAKE 1 TABLET ONCE A DAY AFTER A MEAL. 30 tablet 0  . Multiple Vitamin (MULTIVITAMIN) tablet Take 1 tablet by mouth daily.    . pravastatin (PRAVACHOL) 20 MG tablet Take 1 tablet (20 mg total) by mouth daily. 30 tablet 0  . spironolactone (ALDACTONE) 50 MG tablet TAKE (1) TABLET TWICE DAILY. 60 tablet 0  . terbinafine (LAMISIL) 250 MG tablet Take 1 tablet (250 mg total) by mouth daily. 7 tablet 0  . vitamin B-12 (CYANOCOBALAMIN) 1000 MCG tablet Take 1,000 mcg by mouth daily.    . calcium-vitamin D (OSCAL 500/200 D-3) 500-200 MG-UNIT per tablet Take 1 tablet by  mouth 2 (two) times daily. 180 tablet 3   No current facility-administered medications for this visit.     Physical Exam  Blood pressure 153/76, pulse 77, temperature 97.2 F (36.2 C), height 5\' 4"  (1.626 m), weight 210 lb 6.4 oz (95.437 kg).  Constitutional: overall normal hygiene, normal nutrition, well developed, normal grooming, normal body habitus. Assistive device:none  Musculoskeletal: gait and station Limp none, muscle tone and strength are normal, no tremors or atrophy is present.  .  Neurological: coordination overall normal.  Deep tendon reflex/nerve stretch intact.  Sensation normal.  Cranial nerves II-XII intact.   Skin:   normal overall no scars, lesions, ulcers or rashes. No psoriasis.  Psychiatric: Alert and oriented x 3.  Recent memory intact, remote memory unclear.  Normal mood and affect. Well groomed.  Good eye contact.  Cardiovascular: overall no swelling, no varicosities, no edema bilaterally, normal temperatures of the legs and arms, no clubbing, cyanosis and good capillary refill.  Lymphatic: palpation is normal.  She has a small hematoma on the left lateral mid lower leg but it is not red, it is much smaller than before.  NV is intact.  There is no wound or drainage.  It is slightly tender to palpation.    The patient has been educated about the nature of the problem(s) and counseled on treatment options.  The patient appeared to understand what I have discussed and is in agreement with it.  Encounter Diagnoses  Name Primary?  . Cellulitis of left lower leg Yes  . Type 2 diabetes, diet controlled ([place])   . Essential hypertension     PLAN Call if any problems.  Precautions discussed.  Continue current medications.   Return to clinic 3 weeks   Electronically Signed Sanjuana Kava, MD 6/13/20179:46 AM

## 2015-10-16 ENCOUNTER — Telehealth: Payer: Self-pay | Admitting: Orthopaedic Surgery

## 2015-10-16 NOTE — Telephone Encounter (Signed)
Patient called initially to request refill on anti-biotic (Keflex 500 mg) as she states it helped, and she is out.  She then stated that the same area on her left leg is "sore and has turned dark."  Please advise if needs to be seen here or other recommendation. Her next scheduled appointment is 10/23/15.

## 2015-10-16 NOTE — Telephone Encounter (Signed)
Called back to patient and offered appointment for tomorrow - appointment scheduled.

## 2015-10-17 ENCOUNTER — Ambulatory Visit (INDEPENDENT_AMBULATORY_CARE_PROVIDER_SITE_OTHER): Payer: Medicare (Managed Care) | Admitting: Orthopaedic Surgery

## 2015-10-17 ENCOUNTER — Encounter: Payer: Self-pay | Admitting: Orthopaedic Surgery

## 2015-10-17 VITALS — BP 151/87 | HR 72 | Temp 97.7°F | Resp 16 | Ht 64.5 in | Wt 210.0 lb

## 2015-10-17 DIAGNOSIS — L03116 Cellulitis of left lower limb: Secondary | ICD-10-CM

## 2015-10-17 DIAGNOSIS — E119 Type 2 diabetes mellitus without complications: Secondary | ICD-10-CM | POA: Diagnosis not present

## 2015-10-17 DIAGNOSIS — I10 Essential (primary) hypertension: Secondary | ICD-10-CM

## 2015-10-17 NOTE — Progress Notes (Signed)
Patient [id number]:[id number] Linda Richardson, female DOB:29-Mar-1924, 82 y.o. [id number]  Chief Complaint  Patient presents with  . Follow-up    left lower leg pain    HPI  Linda Richardson is a 82 y.o. female who has resolving cellulitis of the left lower lateral leg.  She has no redness now, no new trauma.  She has completed her antibiotics.  She has hematoma in the area that will take time to resolve.  I have told her to use some mild heat, not hot, to the area and elevate as much as she can.  She has no pain and gait is good.  HPI  Body mass index is 35.5 kg/(m^2).  ROS  Review of Systems  Constitutional:       Patient has Diabetes Mellitus. Patient has hypertension. Patient does not have COPD or shortness of breath. Patient has BMI > 35. Patient does not have current smoking history.  HENT: Negative for congestion.   Respiratory: Negative for cough and shortness of breath.   Cardiovascular: Negative for chest pain and leg swelling.  Endocrine: Positive for cold intolerance.  Musculoskeletal: Positive for myalgias, back pain and arthralgias.  Allergic/Immunologic: Negative for environmental allergies.  Psychiatric/Behavioral: The patient is nervous/anxious.     Past Medical History  Diagnosis Date  . Bladder cancer ([place])   . Obesity   . Dysuria-frequency syndrome   . Anxiety   . Hypothyroidism   . Diabetes ([place])   . Hypertension   . Hyperlipidemia   . Glaucoma     2006    Past Surgical History  Procedure Laterality Date  . Vesicovaginal fistula closure w/ tah    . Surgery on bladder for cancer    . Dilation and curettage of uterus      x3  . Bilateral cataract surgery      2004  . Abdominal hysterectomy      Family History  Problem Relation Age of Onset  . Stroke Mother 36  . Diabetes Sister   . Hypertension Sister     Social History Social History  Substance Use Topics  . Smoking status: Former Research scientist (life sciences)  . Smokeless tobacco: None  . Alcohol Use: No     Allergies  Allergen Reactions  . Avapro [Irbesartan]     Dry cough  . Penicillins   . Sulfonamide Derivatives     Current Outpatient Prescriptions  Medication Sig Dispense Refill  . acyclovir (ZOVIRAX) 400 MG tablet TAKE 1 TABLET BY MOUTH 3 TIMES DAILY. 21 tablet 0  . amLODipine (NORVASC) 5 MG tablet Take 1 tablet (5 mg total) by mouth daily. 30 tablet 0  . aspirin 81 MG tablet Take 81 mg by mouth daily.    . brimonidine (ALPHAGAN P) 0.1 % SOLN Place 1 drop into both eyes 2 (two) times daily. One drop each eye twice daily as needed     . cephALEXin (KEFLEX) 500 MG capsule Take 1 capsule (500 mg total) by mouth 4 (four) times daily. 40 capsule 0  . citalopram (CELEXA) 20 MG tablet TAKE (1) TABLET BY MOUTH ONCE DAILY. 30 tablet 0  . cyclobenzaprine (FLEXERIL) 10 MG tablet Take 1 tablet (10 mg total) by mouth as directed. 60 tablet 2  . HYDROcodone-acetaminophen (NORCO) 7.5-325 MG tablet Take 1 tablet by mouth every 4 (four) hours as needed for moderate pain (Must last 30 days.  Do not drive or operate machinery while taking this medicine.). 120 tablet 0  . ibuprofen (ADVIL,MOTRIN) 800 MG tablet Take  800 mg by mouth 3 (three) times daily.    Marland Kitchen levothyroxine (SYNTHROID, LEVOTHROID) 25 MCG tablet TAKE (1) TABLET BY MOUTH ONCE DAILY. 30 tablet 0  . loteprednol (LOTEMAX) 0.5 % ophthalmic suspension Place 1 drop into the left eye 4 (four) times daily.    . metoprolol succinate (TOPROL-XL) 50 MG 24 hr tablet TAKE 1 TABLET ONCE A DAY AFTER A MEAL. 30 tablet 0  . Multiple Vitamin (MULTIVITAMIN) tablet Take 1 tablet by mouth daily.    . pravastatin (PRAVACHOL) 20 MG tablet Take 1 tablet (20 mg total) by mouth daily. 30 tablet 0  . spironolactone (ALDACTONE) 50 MG tablet TAKE (1) TABLET TWICE DAILY. 60 tablet 0  . terbinafine (LAMISIL) 250 MG tablet Take 1 tablet (250 mg total) by mouth daily. 7 tablet 0  . vitamin B-12 (CYANOCOBALAMIN) 1000 MCG tablet Take 1,000 mcg by mouth daily.    .  calcium-vitamin D (OSCAL 500/200 D-3) 500-200 MG-UNIT per tablet Take 1 tablet by mouth 2 (two) times daily. 180 tablet 3   No current facility-administered medications for this visit.     Physical Exam  Blood pressure 151/87, pulse 72, temperature 97.7 F (36.5 C), resp. rate 16, height 5' 4.5" (1.638 m), weight 210 lb (95.255 kg).  Constitutional: overall normal hygiene, normal nutrition, well developed, normal grooming, normal body habitus. Assistive device:none  Musculoskeletal: gait and station Limp none, muscle tone and strength are normal, no tremors or atrophy is present.  .  Neurological: coordination overall normal.  Deep tendon reflex/nerve stretch intact.  Sensation normal.  Cranial nerves II-XII intact.   Skin:   She has no redness of the left lower leg, she does have small hematoma about 4 cm x 6cm; otherwise overall no scars, lesions, ulcers or rashes. No psoriasis.  Psychiatric: Alert and oriented x 3.  Recent memory intact, remote memory unclear.  Normal mood and affect. Well groomed.  Good eye contact.  Cardiovascular: overall no swelling, no varicosities, no edema bilaterally, normal temperatures of the legs and arms, no clubbing, cyanosis and good capillary refill.  Lymphatic: palpation is normal.  See skin above. The left knee is not hurting. ROM 0 to 110 with crepitus and slight effusion.  She has no limp.  She has no signs of redness to the left lower leg.  The patient has been educated about the nature of the problem(s) and counseled on treatment options.  The patient appeared to understand what I have discussed and is in agreement with it.  Encounter Diagnoses  Name Primary?  . Cellulitis of left lower leg Yes  . Type 2 diabetes, diet controlled ([place])   . Essential hypertension     PLAN Call if any problems.  Precautions discussed.  Continue current medications.   Return to clinic 2 weeks   .wjkx

## 2015-10-23 ENCOUNTER — Ambulatory Visit: Payer: Self-pay | Admitting: Orthopaedic Surgery

## 2015-10-31 ENCOUNTER — Ambulatory Visit (INDEPENDENT_AMBULATORY_CARE_PROVIDER_SITE_OTHER): Payer: Medicare (Managed Care) | Admitting: Orthopaedic Surgery

## 2015-10-31 ENCOUNTER — Encounter: Payer: Self-pay | Admitting: Orthopaedic Surgery

## 2015-10-31 VITALS — BP 185/87 | HR 78 | Temp 97.2°F | Ht 64.5 in | Wt 211.0 lb

## 2015-10-31 DIAGNOSIS — E119 Type 2 diabetes mellitus without complications: Secondary | ICD-10-CM | POA: Diagnosis not present

## 2015-10-31 DIAGNOSIS — I10 Essential (primary) hypertension: Secondary | ICD-10-CM | POA: Diagnosis not present

## 2015-10-31 DIAGNOSIS — L03116 Cellulitis of left lower limb: Secondary | ICD-10-CM

## 2015-10-31 MED ORDER — HYDROCODONE-ACETAMINOPHEN 7.5-325 MG PO TABS: 1.0000 | ORAL_TABLET | ORAL | Status: DC | PRN

## 2015-10-31 NOTE — Progress Notes (Signed)
Patient [id number] Linda Richardson, female DOB:[birth year], 82 y.o. [id number]  Chief Complaint  Patient presents with  . Follow-up    HIP PAIN    HPI  Linda Richardson is a 82 y.o. female who has had cellulitis of the left lower leg and hematoma.  The hematoma is resolving and the cellulitis has now resolved nicely.  She has no redness or pain.  She is walking well.  HPI  Body mass index is 35.67 kg/(m^2).  ROS  Review of Systems  Constitutional:       Patient has Diabetes Mellitus. Patient has hypertension. Patient does not have COPD or shortness of breath. Patient has BMI > 35. Patient does not have current smoking history.  HENT: Negative for congestion.   Respiratory: Negative for cough and shortness of breath.   Cardiovascular: Negative for chest pain and leg swelling.  Endocrine: Positive for cold intolerance.  Musculoskeletal: Positive for myalgias, back pain and arthralgias.  Allergic/Immunologic: Negative for environmental allergies.  Psychiatric/Behavioral: The patient is nervous/anxious.     Past Medical History  Diagnosis Date  . Bladder cancer ([place])   . Obesity   . Dysuria-frequency syndrome   . Anxiety   . Hypothyroidism   . Diabetes ([place])   . Hypertension   . Hyperlipidemia   . Glaucoma     2006    Past Surgical History  Procedure Laterality Date  . Vesicovaginal fistula closure w/ tah    . Surgery on bladder for cancer    . Dilation and curettage of uterus      x3  . Bilateral cataract surgery      2004  . Abdominal hysterectomy      Family History  Problem Relation Age of Onset  . Stroke Mother 47  . Diabetes Sister   . Hypertension Sister     Social History Social History  Substance Use Topics  . Smoking status: Former Research scientist (life sciences)  . Smokeless tobacco: None  . Alcohol Use: No    Allergies  Allergen Reactions  . Avapro [Irbesartan]     Dry cough  . Penicillins   . Sulfonamide Derivatives     Current Outpatient Prescriptions   Medication Sig Dispense Refill  . acyclovir (ZOVIRAX) 400 MG tablet TAKE 1 TABLET BY MOUTH 3 TIMES DAILY. 21 tablet 0  . amLODipine (NORVASC) 5 MG tablet Take 1 tablet (5 mg total) by mouth daily. 30 tablet 0  . aspirin 81 MG tablet Take 81 mg by mouth daily.    . brimonidine (ALPHAGAN P) 0.1 % SOLN Place 1 drop into both eyes 2 (two) times daily. One drop each eye twice daily as needed     . cephALEXin (KEFLEX) 500 MG capsule Take 1 capsule (500 mg total) by mouth 4 (four) times daily. 40 capsule 0  . citalopram (CELEXA) 20 MG tablet TAKE (1) TABLET BY MOUTH ONCE DAILY. 30 tablet 0  . cyclobenzaprine (FLEXERIL) 10 MG tablet Take 1 tablet (10 mg total) by mouth as directed. 60 tablet 2  . HYDROcodone-acetaminophen (NORCO) 7.5-325 MG tablet Take 1 tablet by mouth every 4 (four) hours as needed for moderate pain (Must last 30 days.  Do not drive or operate machinery while taking this medicine.). 120 tablet 0  . ibuprofen (ADVIL,MOTRIN) 800 MG tablet Take 800 mg by mouth 3 (three) times daily.    Marland Kitchen levothyroxine (SYNTHROID, LEVOTHROID) 25 MCG tablet TAKE (1) TABLET BY MOUTH ONCE DAILY. 30 tablet 0  . loteprednol (LOTEMAX) 0.5 % ophthalmic suspension  Place 1 drop into the left eye 4 (four) times daily.    . metoprolol succinate (TOPROL-XL) 50 MG 24 hr tablet TAKE 1 TABLET ONCE A DAY AFTER A MEAL. 30 tablet 0  . Multiple Vitamin (MULTIVITAMIN) tablet Take 1 tablet by mouth daily.    . pravastatin (PRAVACHOL) 20 MG tablet Take 1 tablet (20 mg total) by mouth daily. 30 tablet 0  . spironolactone (ALDACTONE) 50 MG tablet TAKE (1) TABLET TWICE DAILY. 60 tablet 0  . terbinafine (LAMISIL) 250 MG tablet Take 1 tablet (250 mg total) by mouth daily. 7 tablet 0  . vitamin B-12 (CYANOCOBALAMIN) 1000 MCG tablet Take 1,000 mcg by mouth daily.    . calcium-vitamin D (OSCAL 500/200 D-3) 500-200 MG-UNIT per tablet Take 1 tablet by mouth 2 (two) times daily. 180 tablet 3   No current facility-administered  medications for this visit.     Physical Exam  Blood pressure 185/87, pulse 78, temperature 97.2 F (36.2 C), height 5' 4.5" (1.638 m), weight 211 lb (95.709 kg).  Constitutional: overall normal hygiene, normal nutrition, well developed, normal grooming, normal body habitus. Assistive device:none  Musculoskeletal: gait and station Limp none, muscle tone and strength are normal, no tremors or atrophy is present.  .  Neurological: coordination overall normal.  Deep tendon reflex/nerve stretch intact.  Sensation normal.  Cranial nerves II-XII intact.   Skin:   normal overall no scars, lesions, ulcers or rashes. No psoriasis.  Psychiatric: Alert and oriented x 3.  Recent memory intact, remote memory unclear.  Normal mood and affect. Well groomed.  Good eye contact.  Cardiovascular: overall no swelling, no varicosities, no edema bilaterally, normal temperatures of the legs and arms, no clubbing, cyanosis and good capillary refill.  Lymphatic: palpation is normal.  She has a small resolving hematoma of the left lateral lower leg.  She has no redness or wound or drainage.  It is only slightly tender.  NV intact.  The patient has been educated about the nature of the problem(s) and counseled on treatment options.  The patient appeared to understand what I have discussed and is in agreement with it.  Encounter Diagnoses  Name Primary?  . Cellulitis of left lower leg Yes  . Type 2 diabetes, diet controlled ([place])   . Essential hypertension     PLAN Call if any problems.  Precautions discussed.  Continue current medications.   Return to clinic 6 weeks   Electronically Signed Sanjuana Kava, MD 7/13/20179:37 AM

## 2015-12-12 ENCOUNTER — Ambulatory Visit (INDEPENDENT_AMBULATORY_CARE_PROVIDER_SITE_OTHER): Payer: Self-pay | Admitting: Orthopaedic Surgery

## 2015-12-12 ENCOUNTER — Encounter: Payer: Self-pay | Admitting: Orthopaedic Surgery

## 2015-12-12 VITALS — BP 163/80 | HR 86 | Temp 97.2°F | Ht 65.0 in | Wt 211.0 lb

## 2015-12-12 DIAGNOSIS — M25552 Pain in left hip: Secondary | ICD-10-CM

## 2015-12-12 DIAGNOSIS — M25551 Pain in right hip: Secondary | ICD-10-CM

## 2015-12-12 DIAGNOSIS — E119 Type 2 diabetes mellitus without complications: Secondary | ICD-10-CM

## 2015-12-12 DIAGNOSIS — M2550 Pain in unspecified joint: Secondary | ICD-10-CM

## 2015-12-12 DIAGNOSIS — M25512 Pain in left shoulder: Secondary | ICD-10-CM

## 2015-12-12 DIAGNOSIS — I10 Essential (primary) hypertension: Secondary | ICD-10-CM

## 2015-12-12 DIAGNOSIS — M25511 Pain in right shoulder: Secondary | ICD-10-CM

## 2015-12-12 MED ORDER — PREDNISONE 5 MG (21) PO TBPK: ORAL_TABLET | ORAL | 0 refills | Status: DC

## 2015-12-12 MED ORDER — HYDROCODONE-ACETAMINOPHEN 7.5-325 MG PO TABS: 1.0000 | ORAL_TABLET | ORAL | 0 refills | Status: DC | PRN

## 2015-12-12 NOTE — Progress Notes (Signed)
Patient [id number] Linda Richardson, female DOB:1923/12/23, 82 y.o. [id number]  Chief Complaint  Patient presents with  . Follow-up    HIP AND LEG PAIN  . Shoulder Pain    RIGHT    HPI  Linda Richardson is a 82 y.o. female who has pain in the right hip, right shoulder and left hip.  She has had hematoma of the left lower leg that has now resolved.  She has no trauma.  She just celebrated her 89th birthday.  She has no paresthesias from the hips or shoulders.  She is walking well.  I will give prednisone dose pack for the multiple arthralgias. HPI  Body mass index is 35.11 kg/m.  ROS  Review of Systems  Constitutional:       Patient has Diabetes Mellitus. Patient has hypertension. Patient does not have COPD or shortness of breath. Patient has BMI > 35. Patient does not have current smoking history.  HENT: Negative for congestion.   Respiratory: Negative for cough and shortness of breath.   Cardiovascular: Negative for chest pain and leg swelling.  Endocrine: Positive for cold intolerance.  Musculoskeletal: Positive for arthralgias, back pain and myalgias.  Allergic/Immunologic: Negative for environmental allergies.  Psychiatric/Behavioral: The patient is nervous/anxious.     Past Medical History:  Diagnosis Date  . Anxiety   . Bladder cancer ([place])   . Diabetes ([place])   . Dysuria-frequency syndrome   . Glaucoma    2006  . Hyperlipidemia   . Hypertension   . Hypothyroidism   . Obesity     Past Surgical History:  Procedure Laterality Date  . ABDOMINAL HYSTERECTOMY    . Bilateral cataract Surgery     2004  . DILATION AND CURETTAGE OF UTERUS     x3  . Surgery on bladder for cancer    . VESICOVAGINAL FISTULA CLOSURE W/ TAH      Family History  Problem Relation Age of Onset  . Stroke Mother 46  . Diabetes Sister   . Hypertension Sister     Social History Social History  Substance Use Topics  . Smoking status: Former Research scientist (life sciences)  . Smokeless tobacco: Not on  file  . Alcohol use No    Allergies  Allergen Reactions  . Avapro [Irbesartan]     Dry cough  . Penicillins   . Sulfonamide Derivatives     Current Outpatient Prescriptions  Medication Sig Dispense Refill  . acyclovir (ZOVIRAX) 400 MG tablet TAKE 1 TABLET BY MOUTH 3 TIMES DAILY. 21 tablet 0  . amLODipine (NORVASC) 5 MG tablet Take 1 tablet (5 mg total) by mouth daily. 30 tablet 0  . aspirin 81 MG tablet Take 81 mg by mouth daily.    . brimonidine (ALPHAGAN P) 0.1 % SOLN Place 1 drop into both eyes 2 (two) times daily. One drop each eye twice daily as needed     . cephALEXin (KEFLEX) 500 MG capsule Take 1 capsule (500 mg total) by mouth 4 (four) times daily. 40 capsule 0  . citalopram (CELEXA) 20 MG tablet TAKE (1) TABLET BY MOUTH ONCE DAILY. 30 tablet 0  . cyclobenzaprine (FLEXERIL) 10 MG tablet Take 1 tablet (10 mg total) by mouth as directed. 60 tablet 2  . HYDROcodone-acetaminophen (NORCO) 7.5-325 MG tablet Take 1 tablet by mouth every 4 (four) hours as needed for moderate pain (Must last 30 days.  Do not drive or operate machinery while taking this medicine.). 120 tablet 0  . ibuprofen (ADVIL,MOTRIN) 800 MG tablet  Take 800 mg by mouth 3 (three) times daily.    Marland Kitchen levothyroxine (SYNTHROID, LEVOTHROID) 25 MCG tablet TAKE (1) TABLET BY MOUTH ONCE DAILY. 30 tablet 0  . loteprednol (LOTEMAX) 0.5 % ophthalmic suspension Place 1 drop into the left eye 4 (four) times daily.    . metoprolol succinate (TOPROL-XL) 50 MG 24 hr tablet TAKE 1 TABLET ONCE A DAY AFTER A MEAL. 30 tablet 0  . Multiple Vitamin (MULTIVITAMIN) tablet Take 1 tablet by mouth daily.    . pravastatin (PRAVACHOL) 20 MG tablet Take 1 tablet (20 mg total) by mouth daily. 30 tablet 0  . spironolactone (ALDACTONE) 50 MG tablet TAKE (1) TABLET TWICE DAILY. 60 tablet 0  . terbinafine (LAMISIL) 250 MG tablet Take 1 tablet (250 mg total) by mouth daily. 7 tablet 0  . vitamin B-12 (CYANOCOBALAMIN) 1000 MCG tablet Take 1,000 mcg by  mouth daily.    . calcium-vitamin D (OSCAL 500/200 D-3) 500-200 MG-UNIT per tablet Take 1 tablet by mouth 2 (two) times daily. 180 tablet 3  . predniSONE (STERAPRED UNI-PAK 21 TAB) 5 MG (21) TBPK tablet Take 6 pills first day; 5 pills second day; 4 pills third day; 3 pills fourth day; 2 pills next day and 1 pill last day. 21 tablet 0   No current facility-administered medications for this visit.      Physical Exam  Blood pressure (!) 163/80, pulse 86, temperature 97.2 F (36.2 C), height 5\' 5"  (1.651 m), weight 211 lb (95.7 kg).  Constitutional: overall normal hygiene, normal nutrition, well developed, normal grooming, normal body habitus. Assistive device:none  Musculoskeletal: gait and station Limp none, muscle tone and strength are normal, no tremors or atrophy is present.  .  Neurological: coordination overall normal.  Deep tendon reflex/nerve stretch intact.  Sensation normal.  Cranial nerves II-XII intact.   Skin:   normal overall no scars, lesions, ulcers or rashes. No psoriasis.  Psychiatric: Alert and oriented x 3.  Recent memory intact, remote memory unclear.  Normal mood and affect. Well groomed.  Good eye contact.  Cardiovascular: overall no swelling, no varicosities, no edema bilaterally, normal temperatures of the legs and arms, no clubbing, cyanosis and good capillary refill.  Lymphatic: palpation is normal.  Both shoulders have full motion but are tender in the extremes.  She has no redness or swelling.  Grips are normal.  NV intact.  Hands are negative as well has wrists and elbows.  Both hips have slight tenderness more of the left hip over the trochanteric area but she has no limp and full motion of the hips.  Leg lengths are equal.  Gait is normal.  The patient has been educated about the nature of the problem(s) and counseled on treatment options.  The patient appeared to understand what I have discussed and is in agreement with it.  Encounter Diagnosis  Name  Primary?  Marland Kitchen Arthralgia of multiple joints Yes    PLAN Call if any problems.  Precautions discussed.  Continue current medications.   Return to clinic 6 weeks   Electronically Signed Sanjuana Kava, MD 8/24/20179:30 PM

## 2015-12-13 ENCOUNTER — Other Ambulatory Visit: Payer: Self-pay

## 2016-01-22 ENCOUNTER — Ambulatory Visit: Payer: Self-pay | Admitting: Orthopaedic Surgery

## 2016-01-28 ENCOUNTER — Ambulatory Visit (INDEPENDENT_AMBULATORY_CARE_PROVIDER_SITE_OTHER): Payer: Medicare (Managed Care) | Admitting: Orthopaedic Surgery

## 2016-01-28 ENCOUNTER — Encounter: Payer: Self-pay | Admitting: Orthopaedic Surgery

## 2016-01-28 VITALS — BP 185/91 | HR 73 | Temp 97.9°F | Ht 65.0 in | Wt 211.0 lb

## 2016-01-28 DIAGNOSIS — I10 Essential (primary) hypertension: Secondary | ICD-10-CM | POA: Diagnosis not present

## 2016-01-28 DIAGNOSIS — M545 Low back pain, unspecified: Secondary | ICD-10-CM

## 2016-01-28 DIAGNOSIS — G8929 Other chronic pain: Secondary | ICD-10-CM

## 2016-01-28 DIAGNOSIS — M2550 Pain in unspecified joint: Secondary | ICD-10-CM | POA: Diagnosis not present

## 2016-01-28 MED ORDER — HYDROCODONE-ACETAMINOPHEN 7.5-325 MG PO TABS: 1.0000 | ORAL_TABLET | Freq: Four times a day (QID) | ORAL | 0 refills | Status: DC | PRN

## 2016-01-28 MED ORDER — PREDNISONE 5 MG (21) PO TBPK: ORAL_TABLET | ORAL | 0 refills | Status: DC

## 2016-01-28 NOTE — Progress Notes (Signed)
Patient JW:[id number] Linda Richardson, female DOB:Nov 13, 1923, 82 y.o. [id number]  My back hurts  HPI  Linda Richardson is a 82 y.o. female who has back pain diffuse in the mid lower back with no radiation. She has a stabbing pain at times.  She has no trauma, no swelling, no bowel or bladder problems.  She has been very active.  She has chronic right hip pain as well. HPI   ROS  Review of Systems  Past Medical History:  Diagnosis Date  . Anxiety   . Bladder cancer ([place])   . Diabetes ([place])   . Dysuria-frequency syndrome   . Glaucoma    2006  . Hyperlipidemia   . Hypertension   . Hypothyroidism   . Obesity     Past Surgical History:  Procedure Laterality Date  . ABDOMINAL HYSTERECTOMY    . Bilateral cataract Surgery     2004  . DILATION AND CURETTAGE OF UTERUS     x3  . Surgery on bladder for cancer    . VESICOVAGINAL FISTULA CLOSURE W/ TAH      Family History  Problem Relation Age of Onset  . Stroke Mother 34  . Diabetes Sister   . Hypertension Sister     Social History Social History  Substance Use Topics  . Smoking status: Former Research scientist (life sciences)  . Smokeless tobacco: Not on file  . Alcohol use No    Allergies  Allergen Reactions  . Avapro [Irbesartan]     Dry cough  . Penicillins   . Sulfonamide Derivatives     Current Outpatient Prescriptions  Medication Sig Dispense Refill  . acyclovir (ZOVIRAX) 400 MG tablet TAKE 1 TABLET BY MOUTH 3 TIMES DAILY. 21 tablet 0  . amLODipine (NORVASC) 5 MG tablet Take 1 tablet (5 mg total) by mouth daily. 30 tablet 0  . aspirin 81 MG tablet Take 81 mg by mouth daily.    . brimonidine (ALPHAGAN P) 0.1 % SOLN Place 1 drop into both eyes 2 (two) times daily. One drop each eye twice daily as needed     . calcium-vitamin D (OSCAL 500/200 D-3) 500-200 MG-UNIT per tablet Take 1 tablet by mouth 2 (two) times daily. 180 tablet 3  . cephALEXin (KEFLEX) 500 MG capsule Take 1 capsule (500 mg total) by mouth 4 (four) times daily. 40 capsule 0   . citalopram (CELEXA) 20 MG tablet TAKE (1) TABLET BY MOUTH ONCE DAILY. 30 tablet 0  . cyclobenzaprine (FLEXERIL) 10 MG tablet Take 1 tablet (10 mg total) by mouth as directed. 60 tablet 2  . HYDROcodone-acetaminophen (NORCO) 7.5-325 MG tablet Take 1 tablet by mouth every 6 (six) hours as needed for moderate pain (Must last 30 days.Do not drive or operate machinery while taking this medicine.). 110 tablet 0  . ibuprofen (ADVIL,MOTRIN) 800 MG tablet Take 800 mg by mouth 3 (three) times daily.    Marland Kitchen levothyroxine (SYNTHROID, LEVOTHROID) 25 MCG tablet TAKE (1) TABLET BY MOUTH ONCE DAILY. 30 tablet 0  . loteprednol (LOTEMAX) 0.5 % ophthalmic suspension Place 1 drop into the left eye 4 (four) times daily.    . metoprolol succinate (TOPROL-XL) 50 MG 24 hr tablet TAKE 1 TABLET ONCE A DAY AFTER A MEAL. 30 tablet 0  . Multiple Vitamin (MULTIVITAMIN) tablet Take 1 tablet by mouth daily.    . pravastatin (PRAVACHOL) 20 MG tablet Take 1 tablet (20 mg total) by mouth daily. 30 tablet 0  . predniSONE (STERAPRED UNI-PAK 21 TAB) 5 MG (21) TBPK  tablet Take 6 pills first day; 5 pills second day; 4 pills third day; 3 pills fourth day; 2 pills next day and 1 pill last day. 21 tablet 0  . spironolactone (ALDACTONE) 50 MG tablet TAKE (1) TABLET TWICE DAILY. 60 tablet 0  . terbinafine (LAMISIL) 250 MG tablet Take 1 tablet (250 mg total) by mouth daily. 7 tablet 0  . vitamin B-12 (CYANOCOBALAMIN) 1000 MCG tablet Take 1,000 mcg by mouth daily.     No current facility-administered medications for this visit.      Physical Exam Weight 211, BP 185/91, respirations 16, pulse 73  Constitutional: overall normal hygiene, normal nutrition, well developed, normal grooming, normal body habitus. Assistive device:none  Musculoskeletal: gait and station Limp none, muscle tone and strength are normal, no tremors or atrophy is present.  .  Neurological: coordination overall normal.  Deep tendon reflex/nerve stretch intact.   Sensation normal.  Cranial nerves II-XII intact.   Skin:   Normal overall no scars, lesions, ulcers or rashes. No psoriasis.  Psychiatric: Alert and oriented x 3.  Recent memory intact, remote memory unclear.  Normal mood and affect. Well groomed.  Good eye contact.  Cardiovascular: overall no swelling, no varicosities, no edema bilaterally, normal temperatures of the legs and arms, no clubbing, cyanosis and good capillary refill.  Lymphatic: palpation is normal.  Spine/Pelvis examination:  Inspection:  Overall, sacoiliac joint benign and hips nontender; without crepitus or defects.   Thoracic spine inspection: Alignment normal without kyphosis present   Lumbar spine inspection:  Alignment  with normal lumbar lordosis, without scoliosis apparent.   Thoracic spine palpation:  without tenderness of spinal processes   Lumbar spine palpation: with tenderness of lumbar area; without tightness of lumbar muscles    Range of Motion:   Lumbar flexion, forward flexion is 40 without pain or tenderness    Lumbar extension is 5 without pain or tenderness   Left lateral bend is Normal  without pain or tenderness   Right lateral bend is Normal without pain or tenderness   Straight leg raising is Normal   Strength & tone: Normal   Stability overall normal stability     The patient has been educated about the nature of the problem(s) and counseled on treatment options.  The patient appeared to understand what I have discussed and is in agreement with it.  Encounter Diagnoses  Name Primary?  . Chronic midline low back pain without sciatica Yes  . Arthralgia of multiple joints   . Essential hypertension     PLAN Call if any problems.  Precautions discussed.  Continue current medications.   Return to clinic 1 month   Electronically Signed Sanjuana Kava, MD 10/10/20178:23 AM

## 2016-02-25 ENCOUNTER — Encounter: Payer: Self-pay | Admitting: Orthopaedic Surgery

## 2016-02-25 ENCOUNTER — Ambulatory Visit (INDEPENDENT_AMBULATORY_CARE_PROVIDER_SITE_OTHER): Payer: Medicare (Managed Care) | Admitting: Orthopaedic Surgery

## 2016-02-25 VITALS — BP 144/81 | HR 96 | Temp 97.5°F | Ht 65.0 in | Wt 212.0 lb

## 2016-02-25 DIAGNOSIS — E119 Type 2 diabetes mellitus without complications: Secondary | ICD-10-CM

## 2016-02-25 DIAGNOSIS — M5441 Lumbago with sciatica, right side: Secondary | ICD-10-CM | POA: Diagnosis not present

## 2016-02-25 DIAGNOSIS — I10 Essential (primary) hypertension: Secondary | ICD-10-CM

## 2016-02-25 DIAGNOSIS — M2550 Pain in unspecified joint: Secondary | ICD-10-CM | POA: Diagnosis not present

## 2016-02-25 DIAGNOSIS — G8929 Other chronic pain: Secondary | ICD-10-CM

## 2016-02-25 MED ORDER — HYDROCODONE-ACETAMINOPHEN 7.5-325 MG PO TABS: 1.0000 | ORAL_TABLET | Freq: Four times a day (QID) | ORAL | 0 refills | Status: DC | PRN

## 2016-02-25 MED ORDER — IBUPROFEN 600 MG PO TABS: 600.0000 mg | ORAL_TABLET | Freq: Three times a day (TID) | ORAL | 5 refills | Status: DC | PRN

## 2016-02-25 NOTE — Progress Notes (Signed)
Patient Linda Richardson, female DOB:02/10/1924, 82 y.o. [id number]  Chief Complaint  Patient presents with  . Follow-up    Hip Pain    HPI  Linda Richardson is a 82 y.o. female who has chronic lower back pain and now has some right sided sciatica.  She is on ibuprofen.  The prednisone helped last visit. She has no new trauma, no bowel or bladder problems.   HPI  Body mass index is 35.28 kg/m.  ROS  Review of Systems  Constitutional:       Patient has Diabetes Mellitus. Patient has hypertension. Patient does not have COPD or shortness of breath. Patient has BMI > 35. Patient does not have current smoking history.  HENT: Negative for congestion.   Respiratory: Negative for cough and shortness of breath.   Cardiovascular: Negative for chest pain and leg swelling.  Endocrine: Positive for cold intolerance.  Musculoskeletal: Positive for arthralgias, back pain and myalgias.  Allergic/Immunologic: Negative for environmental allergies.  Psychiatric/Behavioral: The patient is nervous/anxious.     Past Medical History:  Diagnosis Date  . Anxiety   . Bladder cancer ([place])   . Diabetes ([place])   . Dysuria-frequency syndrome   . Glaucoma    2006  . Hyperlipidemia   . Hypertension   . Hypothyroidism   . Obesity     Past Surgical History:  Procedure Laterality Date  . ABDOMINAL HYSTERECTOMY    . Bilateral cataract Surgery     2004  . DILATION AND CURETTAGE OF UTERUS     x3  . Surgery on bladder for cancer    . VESICOVAGINAL FISTULA CLOSURE W/ TAH      Family History  Problem Relation Age of Onset  . Stroke Mother 40  . Diabetes Sister   . Hypertension Sister     Social History Social History  Substance Use Topics  . Smoking status: Former Research scientist (life sciences)  . Smokeless tobacco: Never Used  . Alcohol use No    Allergies  Allergen Reactions  . Avapro [Irbesartan]     Dry cough  . Penicillins   . Sulfonamide Derivatives     Current Outpatient Prescriptions   Medication Sig Dispense Refill  . acyclovir (ZOVIRAX) 400 MG tablet TAKE 1 TABLET BY MOUTH 3 TIMES DAILY. 21 tablet 0  . amLODipine (NORVASC) 5 MG tablet Take 1 tablet (5 mg total) by mouth daily. 30 tablet 0  . aspirin 81 MG tablet Take 81 mg by mouth daily.    . brimonidine (ALPHAGAN P) 0.1 % SOLN Place 1 drop into both eyes 2 (two) times daily. One drop each eye twice daily as needed     . cephALEXin (KEFLEX) 500 MG capsule Take 1 capsule (500 mg total) by mouth 4 (four) times daily. 40 capsule 0  . citalopram (CELEXA) 20 MG tablet TAKE (1) TABLET BY MOUTH ONCE DAILY. 30 tablet 0  . cyclobenzaprine (FLEXERIL) 10 MG tablet Take 1 tablet (10 mg total) by mouth as directed. 60 tablet 2  . HYDROcodone-acetaminophen (NORCO) 7.5-325 MG tablet Take 1 tablet by mouth every 6 (six) hours as needed for moderate pain (Must last 30 days.Do not drive or operate machinery while taking this medicine.). 110 tablet 0  . levothyroxine (SYNTHROID, LEVOTHROID) 25 MCG tablet TAKE (1) TABLET BY MOUTH ONCE DAILY. 30 tablet 0  . loteprednol (LOTEMAX) 0.5 % ophthalmic suspension Place 1 drop into the left eye 4 (four) times daily.    . metoprolol succinate (TOPROL-XL) 50 MG 24 hr  tablet TAKE 1 TABLET ONCE A DAY AFTER A MEAL. 30 tablet 0  . Multiple Vitamin (MULTIVITAMIN) tablet Take 1 tablet by mouth daily.    . pravastatin (PRAVACHOL) 20 MG tablet Take 1 tablet (20 mg total) by mouth daily. 30 tablet 0  . predniSONE (STERAPRED UNI-PAK 21 TAB) 5 MG (21) TBPK tablet Take 6 pills first day; 5 pills second day; 4 pills third day; 3 pills fourth day; 2 pills next day and 1 pill last day. 21 tablet 0  . spironolactone (ALDACTONE) 50 MG tablet TAKE (1) TABLET TWICE DAILY. 60 tablet 0  . terbinafine (LAMISIL) 250 MG tablet Take 1 tablet (250 mg total) by mouth daily. 7 tablet 0  . vitamin B-12 (CYANOCOBALAMIN) 1000 MCG tablet Take 1,000 mcg by mouth daily.    . calcium-vitamin D (OSCAL 500/200 D-3) 500-200 MG-UNIT per  tablet Take 1 tablet by mouth 2 (two) times daily. 180 tablet 3  . ibuprofen (ADVIL,MOTRIN) 600 MG tablet Take 1 tablet (600 mg total) by mouth every 8 (eight) hours as needed. 100 tablet 5   No current facility-administered medications for this visit.      Physical Exam  Blood pressure (!) 144/81, pulse 96, temperature 97.5 F (36.4 C), height 5\' 5"  (1.651 m), weight 212 lb (96.2 kg).  Constitutional: overall normal hygiene, normal nutrition, well developed, normal grooming, normal body habitus. Assistive device:cane  Musculoskeletal: gait and station Limp right, muscle tone and strength are normal, no tremors or atrophy is present.  .  Neurological: coordination overall normal.  Deep tendon reflex/nerve stretch intact.  Sensation normal.  Cranial nerves II-XII intact.   Skin:   Normal overall no scars, lesions, ulcers or rashes. No psoriasis.  Psychiatric: Alert and oriented x 3.  Recent memory intact, remote memory unclear.  Normal mood and affect. Well groomed.  Good eye contact.  Cardiovascular: overall no swelling, no varicosities, no edema bilaterally, normal temperatures of the legs and arms, no clubbing, cyanosis and good capillary refill.  Lymphatic: palpation is normal.  Spine/Pelvis examination:  Inspection:  Overall, sacoiliac joint benign and hips nontender; without crepitus or defects.   Thoracic spine inspection: Alignment normal without kyphosis present   Lumbar spine inspection:  Alignment  with normal lumbar lordosis, without scoliosis apparent.   Thoracic spine palpation:  without tenderness of spinal processes   Lumbar spine palpation: with tenderness of lumbar area; without tightness of lumbar muscles    Range of Motion:   Lumbar flexion, forward flexion is 45 without pain or tenderness    Lumbar extension is 10 without pain or tenderness   Left lateral bend is Normal  without pain or tenderness   Right lateral bend is Normal without pain or  tenderness   Straight leg raising is Normal   Strength & tone: Normal   Stability overall normal stability     The patient has been educated about the nature of the problem(s) and counseled on treatment options.  The patient appeared to understand what I have discussed and is in agreement with it.  Encounter Diagnoses  Name Primary?  . Chronic right-sided low back pain with right-sided sciatica Yes  . Type 2 diabetes, diet controlled ([place])   . Essential hypertension   . Arthralgia of multiple joints     PLAN Call if any problems.  Precautions discussed.  Continue current medications.   Return to clinic 1 month   Electronically [place], MD 11/7/20178:55 AM

## 2016-03-24 ENCOUNTER — Ambulatory Visit (INDEPENDENT_AMBULATORY_CARE_PROVIDER_SITE_OTHER): Payer: Medicare (Managed Care) | Admitting: Orthopaedic Surgery

## 2016-03-24 VITALS — BP 160/76 | HR 82 | Temp 97.2°F | Ht 65.0 in | Wt 216.0 lb

## 2016-03-24 DIAGNOSIS — M5441 Lumbago with sciatica, right side: Secondary | ICD-10-CM | POA: Diagnosis not present

## 2016-03-24 DIAGNOSIS — M2550 Pain in unspecified joint: Secondary | ICD-10-CM | POA: Diagnosis not present

## 2016-03-24 DIAGNOSIS — I10 Essential (primary) hypertension: Secondary | ICD-10-CM | POA: Diagnosis not present

## 2016-03-24 DIAGNOSIS — E119 Type 2 diabetes mellitus without complications: Secondary | ICD-10-CM

## 2016-03-24 DIAGNOSIS — G8929 Other chronic pain: Secondary | ICD-10-CM

## 2016-03-24 MED ORDER — HYDROCODONE-ACETAMINOPHEN 7.5-325 MG PO TABS: 1.0000 | ORAL_TABLET | Freq: Four times a day (QID) | ORAL | 0 refills | Status: DC | PRN

## 2016-03-24 NOTE — Progress Notes (Signed)
Patient [id number] Linda Richardson, female DOB:[birth year], 82 y.o. [id number]  Chief Complaint  Patient presents with  . Follow-up    Back pain    HPI  Linda Richardson is a 82 y.o. female who has chronic but stable lower back pain with some right sided sciatica at times.  It is well controlled. She has no new trauma.  She is very active.  She has had some edema in the past with both legs but it is better today.   HPI  Body mass index is 35.94 kg/m.  ROS  Review of Systems  Constitutional:       Patient has Diabetes Mellitus. Patient has hypertension. Patient does not have COPD or shortness of breath. Patient has BMI > 35. Patient does not have current smoking history.  HENT: Negative for congestion.   Respiratory: Negative for cough and shortness of breath.   Cardiovascular: Negative for chest pain and leg swelling.  Endocrine: Positive for cold intolerance.  Musculoskeletal: Positive for arthralgias, back pain and myalgias.  Allergic/Immunologic: Negative for environmental allergies.  Psychiatric/Behavioral: The patient is nervous/anxious.     Past Medical History:  Diagnosis Date  . Anxiety   . Bladder cancer ([place])   . Diabetes ([place])   . Dysuria-frequency syndrome   . Glaucoma    2006  . Hyperlipidemia   . Hypertension   . Hypothyroidism   . Obesity     Past Surgical History:  Procedure Laterality Date  . ABDOMINAL HYSTERECTOMY    . Bilateral cataract Surgery     2004  . DILATION AND CURETTAGE OF UTERUS     x3  . Surgery on bladder for cancer    . VESICOVAGINAL FISTULA CLOSURE W/ TAH      Family History  Problem Relation Age of Onset  . Stroke Mother 3  . Diabetes Sister   . Hypertension Sister     Social History Social History  Substance Use Topics  . Smoking status: Former Research scientist (life sciences)  . Smokeless tobacco: Never Used  . Alcohol use No    Allergies  Allergen Reactions  . Avapro [Irbesartan]     Dry cough  . Penicillins   . Sulfonamide  Derivatives     Current Outpatient Prescriptions  Medication Sig Dispense Refill  . acyclovir (ZOVIRAX) 400 MG tablet TAKE 1 TABLET BY MOUTH 3 TIMES DAILY. 21 tablet 0  . amLODipine (NORVASC) 5 MG tablet Take 1 tablet (5 mg total) by mouth daily. 30 tablet 0  . aspirin 81 MG tablet Take 81 mg by mouth daily.    . brimonidine (ALPHAGAN P) 0.1 % SOLN Place 1 drop into both eyes 2 (two) times daily. One drop each eye twice daily as needed     . calcium-vitamin D (OSCAL 500/200 D-3) 500-200 MG-UNIT per tablet Take 1 tablet by mouth 2 (two) times daily. 180 tablet 3  . cephALEXin (KEFLEX) 500 MG capsule Take 1 capsule (500 mg total) by mouth 4 (four) times daily. 40 capsule 0  . citalopram (CELEXA) 20 MG tablet TAKE (1) TABLET BY MOUTH ONCE DAILY. 30 tablet 0  . cyclobenzaprine (FLEXERIL) 10 MG tablet Take 1 tablet (10 mg total) by mouth as directed. 60 tablet 2  . HYDROcodone-acetaminophen (NORCO) 7.5-325 MG tablet Take 1 tablet by mouth every 6 (six) hours as needed for moderate pain (Must last 30 days.Do not drive or operate machinery while taking this medicine.). 110 tablet 0  . ibuprofen (ADVIL,MOTRIN) 600 MG tablet Take 1 tablet (600 mg  total) by mouth every 8 (eight) hours as needed. 100 tablet 5  . levothyroxine (SYNTHROID, LEVOTHROID) 25 MCG tablet TAKE (1) TABLET BY MOUTH ONCE DAILY. 30 tablet 0  . loteprednol (LOTEMAX) 0.5 % ophthalmic suspension Place 1 drop into the left eye 4 (four) times daily.    . metoprolol succinate (TOPROL-XL) 50 MG 24 hr tablet TAKE 1 TABLET ONCE A DAY AFTER A MEAL. 30 tablet 0  . Multiple Vitamin (MULTIVITAMIN) tablet Take 1 tablet by mouth daily.    . pravastatin (PRAVACHOL) 20 MG tablet Take 1 tablet (20 mg total) by mouth daily. 30 tablet 0  . predniSONE (STERAPRED UNI-PAK 21 TAB) 5 MG (21) TBPK tablet Take 6 pills first day; 5 pills second day; 4 pills third day; 3 pills fourth day; 2 pills next day and 1 pill last day. 21 tablet 0  . spironolactone  (ALDACTONE) 50 MG tablet TAKE (1) TABLET TWICE DAILY. 60 tablet 0  . terbinafine (LAMISIL) 250 MG tablet Take 1 tablet (250 mg total) by mouth daily. 7 tablet 0  . vitamin B-12 (CYANOCOBALAMIN) 1000 MCG tablet Take 1,000 mcg by mouth daily.     No current facility-administered medications for this visit.      Physical Exam  Blood pressure (!) 160/76, pulse 82, temperature 97.2 F (36.2 C), height 5\' 5"  (1.651 m), weight 216 lb (98 kg).  Constitutional: overall normal hygiene, normal nutrition, well developed, normal grooming, normal body habitus. Assistive device:none  Musculoskeletal: gait and station Limp none, muscle tone and strength are normal, no tremors or atrophy is present.  .  Neurological: coordination overall normal.  Deep tendon reflex/nerve stretch intact.  Sensation normal.  Cranial nerves II-XII intact.   Skin:   Normal overall no scars, lesions, ulcers or rashes. No psoriasis.  Psychiatric: Alert and oriented x 3.  Recent memory intact, remote memory unclear.  Normal mood and affect. Well groomed.  Good eye contact.  Cardiovascular: overall no swelling, no varicosities, no edema bilaterally, normal temperatures of the legs and arms, no clubbing, cyanosis and good capillary refill.  Lymphatic: palpation is normal.  Spine/Pelvis examination:  Inspection:  Overall, sacoiliac joint benign and hips nontender; without crepitus or defects.   Thoracic spine inspection: Alignment normal without kyphosis present   Lumbar spine inspection:  Alignment  with normal lumbar lordosis, without scoliosis apparent.   Thoracic spine palpation:  without tenderness of spinal processes   Lumbar spine palpation: with tenderness of lumbar area; without tightness of lumbar muscles    Range of Motion:   Lumbar flexion, forward flexion is 40 without pain or tenderness    Lumbar extension is 10 without pain or tenderness   Left lateral bend is Normal  without pain or tenderness   Right  lateral bend is Normal without pain or tenderness   Straight leg raising is Normal   Strength & tone: Normal   Stability overall normal stability     The patient has been educated about the nature of the problem(s) and counseled on treatment options.  The patient appeared to understand what I have discussed and is in agreement with it.  Encounter Diagnoses  Name Primary?  . Chronic right-sided low back pain with right-sided sciatica Yes  . Type 2 diabetes, diet controlled ([place])   . Essential hypertension   . Arthralgia of multiple joints     PLAN Call if any problems.  Precautions discussed.  Continue current medications.   Return to clinic 1 month   Electronically  Signed Sanjuana Kava, MD 12/5/20178:49 AM

## 2016-04-30 ENCOUNTER — Ambulatory Visit: Payer: Self-pay | Admitting: Orthopaedic Surgery

## 2016-05-07 ENCOUNTER — Ambulatory Visit: Payer: Self-pay | Admitting: Orthopaedic Surgery

## 2016-05-19 ENCOUNTER — Ambulatory Visit (INDEPENDENT_AMBULATORY_CARE_PROVIDER_SITE_OTHER): Payer: Medicare (Managed Care) | Admitting: Orthopaedic Surgery

## 2016-05-19 ENCOUNTER — Encounter: Payer: Self-pay | Admitting: Orthopaedic Surgery

## 2016-05-19 VITALS — BP 166/83 | HR 82 | Temp 96.8°F | Ht 65.0 in | Wt 208.0 lb

## 2016-05-19 DIAGNOSIS — E119 Type 2 diabetes mellitus without complications: Secondary | ICD-10-CM

## 2016-05-19 DIAGNOSIS — M2550 Pain in unspecified joint: Secondary | ICD-10-CM | POA: Diagnosis not present

## 2016-05-19 DIAGNOSIS — M5441 Lumbago with sciatica, right side: Secondary | ICD-10-CM

## 2016-05-19 DIAGNOSIS — G8929 Other chronic pain: Secondary | ICD-10-CM

## 2016-05-19 DIAGNOSIS — I10 Essential (primary) hypertension: Secondary | ICD-10-CM | POA: Diagnosis not present

## 2016-05-19 MED ORDER — HYDROCODONE-ACETAMINOPHEN 7.5-325 MG PO TABS: 1.0000 | ORAL_TABLET | Freq: Four times a day (QID) | ORAL | 0 refills | Status: DC | PRN

## 2016-05-19 NOTE — Progress Notes (Signed)
Patient Linda Richardson, female DOB:[birth year], 83 y.o. [id number]  Chief Complaint  Patient presents with  . Follow-up    chronic back pain    HPI  Linda Richardson is a 83 y.o. female who has lower back pain chronically. She has some right sided sciatica.  She has no new trauma.  She is very active for 89.  She has no bowel or bladder problems and is doing her exercises.   HPI  Body mass index is 34.61 kg/m.  ROS  Review of Systems  Constitutional:       Patient has Diabetes Mellitus. Patient has hypertension. Patient does not have COPD or shortness of breath. Patient has BMI > 35. Patient does not have current smoking history.  HENT: Negative for congestion.   Respiratory: Negative for cough and shortness of breath.   Cardiovascular: Negative for chest pain and leg swelling.  Endocrine: Positive for cold intolerance.  Musculoskeletal: Positive for arthralgias, back pain and myalgias.  Allergic/Immunologic: Negative for environmental allergies.  Psychiatric/Behavioral: The patient is nervous/anxious.     Past Medical History:  Diagnosis Date  . Anxiety   . Bladder cancer ([place])   . Diabetes ([place])   . Dysuria-frequency syndrome   . Glaucoma    2006  . Hyperlipidemia   . Hypertension   . Hypothyroidism   . Obesity     Past Surgical History:  Procedure Laterality Date  . ABDOMINAL HYSTERECTOMY    . Bilateral cataract Surgery     2004  . DILATION AND CURETTAGE OF UTERUS     x3  . Surgery on bladder for cancer    . VESICOVAGINAL FISTULA CLOSURE W/ TAH      Family History  Problem Relation Age of Onset  . Stroke Mother 85  . Diabetes Sister   . Hypertension Sister     Social History Social History  Substance Use Topics  . Smoking status: Former Research scientist (life sciences)  . Smokeless tobacco: Never Used  . Alcohol use No    Allergies  Allergen Reactions  . Avapro [Irbesartan]     Dry cough  . Penicillins   . Sulfonamide Derivatives     Current  Outpatient Prescriptions  Medication Sig Dispense Refill  . acyclovir (ZOVIRAX) 400 MG tablet TAKE 1 TABLET BY MOUTH 3 TIMES DAILY. 21 tablet 0  . amLODipine (NORVASC) 5 MG tablet Take 1 tablet (5 mg total) by mouth daily. 30 tablet 0  . aspirin 81 MG tablet Take 81 mg by mouth daily.    . brimonidine (ALPHAGAN P) 0.1 % SOLN Place 1 drop into both eyes 2 (two) times daily. One drop each eye twice daily as needed     . calcium-vitamin D (OSCAL 500/200 D-3) 500-200 MG-UNIT per tablet Take 1 tablet by mouth 2 (two) times daily. 180 tablet 3  . cephALEXin (KEFLEX) 500 MG capsule Take 1 capsule (500 mg total) by mouth 4 (four) times daily. 40 capsule 0  . citalopram (CELEXA) 20 MG tablet TAKE (1) TABLET BY MOUTH ONCE DAILY. 30 tablet 0  . cyclobenzaprine (FLEXERIL) 10 MG tablet Take 1 tablet (10 mg total) by mouth as directed. 60 tablet 2  . HYDROcodone-acetaminophen (NORCO) 7.5-325 MG tablet Take 1 tablet by mouth every 6 (six) hours as needed for moderate pain (Must last 30 days.Do not drive or operate machinery while taking this medicine.). 110 tablet 0  . ibuprofen (ADVIL,MOTRIN) 600 MG tablet Take 1 tablet (600 mg total) by mouth every 8 (eight) hours as  needed. 100 tablet 5  . levothyroxine (SYNTHROID, LEVOTHROID) 25 MCG tablet TAKE (1) TABLET BY MOUTH ONCE DAILY. 30 tablet 0  . loteprednol (LOTEMAX) 0.5 % ophthalmic suspension Place 1 drop into the left eye 4 (four) times daily.    . metoprolol succinate (TOPROL-XL) 50 MG 24 hr tablet TAKE 1 TABLET ONCE A DAY AFTER A MEAL. 30 tablet 0  . Multiple Vitamin (MULTIVITAMIN) tablet Take 1 tablet by mouth daily.    . pravastatin (PRAVACHOL) 20 MG tablet Take 1 tablet (20 mg total) by mouth daily. 30 tablet 0  . predniSONE (STERAPRED UNI-PAK 21 TAB) 5 MG (21) TBPK tablet Take 6 pills first day; 5 pills second day; 4 pills third day; 3 pills fourth day; 2 pills next day and 1 pill last day. 21 tablet 0  . spironolactone (ALDACTONE) 50 MG tablet TAKE (1)  TABLET TWICE DAILY. 60 tablet 0  . terbinafine (LAMISIL) 250 MG tablet Take 1 tablet (250 mg total) by mouth daily. 7 tablet 0  . vitamin B-12 (CYANOCOBALAMIN) 1000 MCG tablet Take 1,000 mcg by mouth daily.     No current facility-administered medications for this visit.      Physical Exam  Blood pressure (!) 166/83, pulse 82, temperature (!) 96.8 F (36 C), height 5\' 5"  (1.651 m), weight 208 lb (94.3 kg).  Constitutional: overall normal hygiene, normal nutrition, well developed, normal grooming, normal body habitus. Assistive device:none  Musculoskeletal: gait and station Limp none, muscle tone and strength are normal, no tremors or atrophy is present.  .  Neurological: coordination overall normal.  Deep tendon reflex/nerve stretch intact.  Sensation normal.  Cranial nerves II-XII intact.   Skin:   Normal overall no scars, lesions, ulcers or rashes. No psoriasis.  Psychiatric: Alert and oriented x 3.  Recent memory intact, remote memory unclear.  Normal mood and affect. Well groomed.  Good eye contact.  Cardiovascular: overall no swelling, no varicosities, no edema bilaterally, normal temperatures of the legs and arms, no clubbing, cyanosis and good capillary refill.  Lymphatic: palpation is normal.  Spine/Pelvis examination:  Inspection:  Overall, sacoiliac joint benign and hips nontender; without crepitus or defects.   Thoracic spine inspection: Alignment normal without kyphosis present   Lumbar spine inspection:  Alignment  with normal lumbar lordosis, without scoliosis apparent.   Thoracic spine palpation:  without tenderness of spinal processes   Lumbar spine palpation: with tenderness of lumbar area; without tightness of lumbar muscles    Range of Motion:   Lumbar flexion, forward flexion is 45 without pain or tenderness    Lumbar extension is 10 without pain or tenderness   Left lateral bend is Normal  without pain or tenderness   Right lateral bend is Normal  without pain or tenderness   Straight leg raising is Normal   Strength & tone: Normal   Stability overall normal stability     The patient has been educated about the nature of the problem(s) and counseled on treatment options.  The patient appeared to understand what I have discussed and is in agreement with it.  Encounter Diagnoses  Name Primary?  . Chronic right-sided low back pain with right-sided sciatica Yes  . Type 2 diabetes, diet controlled ([place])   . Essential hypertension   . Arthralgia of multiple joints     PLAN Call if any problems.  Precautions discussed.  Continue current medications.   Return to clinic 1 month   I have reviewed the [facility] Controlled Substance  Reporting System web site prior to prescribing narcotic medicine for this patient.  Electronically Signed Sanjuana Kava, MD 1/30/20188:35 AM

## 2016-06-16 ENCOUNTER — Ambulatory Visit (INDEPENDENT_AMBULATORY_CARE_PROVIDER_SITE_OTHER): Payer: Medicare (Managed Care) | Admitting: Orthopaedic Surgery

## 2016-06-16 VITALS — BP 110/59 | HR 55 | Temp 97.2°F | Ht 65.0 in | Wt 209.0 lb

## 2016-06-16 DIAGNOSIS — I10 Essential (primary) hypertension: Secondary | ICD-10-CM

## 2016-06-16 DIAGNOSIS — M5441 Lumbago with sciatica, right side: Secondary | ICD-10-CM

## 2016-06-16 DIAGNOSIS — G8929 Other chronic pain: Secondary | ICD-10-CM | POA: Diagnosis not present

## 2016-06-16 DIAGNOSIS — E119 Type 2 diabetes mellitus without complications: Secondary | ICD-10-CM | POA: Diagnosis not present

## 2016-06-16 NOTE — Progress Notes (Signed)
Patient Linda Richardson [name], female DOB:12-21-1923, 83 y.o. [id number]  Chief Complaint  Patient presents with  . Follow-up    Low back pain    HPI  Linda Richardson is a 83 y.o. female who has right sided sciatica.  She is stable.  She has no new trauma.  She has less paresthesias.  She is walking and being active. HPI  Body mass index is 34.78 kg/m.  ROS  Review of Systems  Constitutional:       Patient has Diabetes Mellitus. Patient has hypertension. Patient does not have COPD or shortness of breath. Patient has BMI > 35. Patient does not have current smoking history.  HENT: Negative for congestion.   Respiratory: Negative for cough and shortness of breath.   Cardiovascular: Negative for chest pain and leg swelling.  Endocrine: Positive for cold intolerance.  Musculoskeletal: Positive for arthralgias, back pain and myalgias.  Allergic/Immunologic: Negative for environmental allergies.  Psychiatric/Behavioral: The patient is nervous/anxious.     Past Medical History:  Diagnosis Date  . Anxiety   . Bladder cancer ([place])   . Diabetes ([place])   . Dysuria-frequency syndrome   . Glaucoma    2006  . Hyperlipidemia   . Hypertension   . Hypothyroidism   . Obesity     Past Surgical History:  Procedure Laterality Date  . ABDOMINAL HYSTERECTOMY    . Bilateral cataract Surgery     2004  . DILATION AND CURETTAGE OF UTERUS     x3  . Surgery on bladder for cancer    . VESICOVAGINAL FISTULA CLOSURE W/ TAH      Family History  Problem Relation Age of Onset  . Stroke Mother 9  . Diabetes Sister   . Hypertension Sister     Social History Social History  Substance Use Topics  . Smoking status: Former Research scientist (life sciences)  . Smokeless tobacco: Never Used  . Alcohol use No    Allergies  Allergen Reactions  . Avapro [Irbesartan]     Dry cough  . Penicillins   . Sulfonamide Derivatives     Current Outpatient Prescriptions  Medication Sig Dispense Refill  . acyclovir  (ZOVIRAX) 400 MG tablet TAKE 1 TABLET BY MOUTH 3 TIMES DAILY. 21 tablet 0  . amLODipine (NORVASC) 5 MG tablet Take 1 tablet (5 mg total) by mouth daily. 30 tablet 0  . aspirin 81 MG tablet Take 81 mg by mouth daily.    . brimonidine (ALPHAGAN P) 0.1 % SOLN Place 1 drop into both eyes 2 (two) times daily. One drop each eye twice daily as needed     . calcium-vitamin D (OSCAL 500/200 D-3) 500-200 MG-UNIT per tablet Take 1 tablet by mouth 2 (two) times daily. 180 tablet 3  . cephALEXin (KEFLEX) 500 MG capsule Take 1 capsule (500 mg total) by mouth 4 (four) times daily. 40 capsule 0  . citalopram (CELEXA) 20 MG tablet TAKE (1) TABLET BY MOUTH ONCE DAILY. 30 tablet 0  . cyclobenzaprine (FLEXERIL) 10 MG tablet Take 1 tablet (10 mg total) by mouth as directed. 60 tablet 2  . HYDROcodone-acetaminophen (NORCO) 7.5-325 MG tablet Take 1 tablet by mouth every 6 (six) hours as needed for moderate pain (Must last 30 days.Do not drive or operate machinery while taking this medicine.). 110 tablet 0  . ibuprofen (ADVIL,MOTRIN) 600 MG tablet Take 1 tablet (600 mg total) by mouth every 8 (eight) hours as needed. 100 tablet 5  . levothyroxine (SYNTHROID, LEVOTHROID) 25 MCG tablet TAKE (  1) TABLET BY MOUTH ONCE DAILY. 30 tablet 0  . loteprednol (LOTEMAX) 0.5 % ophthalmic suspension Place 1 drop into the left eye 4 (four) times daily.    . metoprolol succinate (TOPROL-XL) 50 MG 24 hr tablet TAKE 1 TABLET ONCE A DAY AFTER A MEAL. 30 tablet 0  . Multiple Vitamin (MULTIVITAMIN) tablet Take 1 tablet by mouth daily.    . pravastatin (PRAVACHOL) 20 MG tablet Take 1 tablet (20 mg total) by mouth daily. 30 tablet 0  . predniSONE (STERAPRED UNI-PAK 21 TAB) 5 MG (21) TBPK tablet Take 6 pills first day; 5 pills second day; 4 pills third day; 3 pills fourth day; 2 pills next day and 1 pill last day. 21 tablet 0  . spironolactone (ALDACTONE) 50 MG tablet TAKE (1) TABLET TWICE DAILY. 60 tablet 0  . terbinafine (LAMISIL) 250 MG tablet  Take 1 tablet (250 mg total) by mouth daily. 7 tablet 0  . vitamin B-12 (CYANOCOBALAMIN) 1000 MCG tablet Take 1,000 mcg by mouth daily.     No current facility-administered medications for this visit.      Physical Exam  Blood pressure (!) 110/59, pulse (!) 55, temperature 97.2 F (36.2 C), height 5\' 5"  (1.651 m), weight 209 lb (94.8 kg).  Constitutional: overall normal hygiene, normal nutrition, well developed, normal grooming, normal body habitus. Assistive device:none  Musculoskeletal: gait and station Limp none, muscle tone and strength are normal, no tremors or atrophy is present.  .  Neurological: coordination overall normal.  Deep tendon reflex/nerve stretch intact.  Sensation normal.  Cranial nerves II-XII intact.   Skin:   Normal overall no scars, lesions, ulcers or rashes. No psoriasis.  Psychiatric: Alert and oriented x 3.  Recent memory intact, remote memory unclear.  Normal mood and affect. Well groomed.  Good eye contact.  Cardiovascular: overall no swelling, no varicosities, no edema bilaterally, normal temperatures of the legs and arms, no clubbing, cyanosis and good capillary refill.  Lymphatic: palpation is normal.  Spine/Pelvis examination:  Inspection:  Overall, sacoiliac joint benign and hips nontender; without crepitus or defects.   Thoracic spine inspection: Alignment normal without kyphosis present   Lumbar spine inspection:  Alignment  with normal lumbar lordosis, without scoliosis apparent.   Thoracic spine palpation:  without tenderness of spinal processes   Lumbar spine palpation: with tenderness of lumbar area; without tightness of lumbar muscles    Range of Motion:   Lumbar flexion, forward flexion is 45 without pain or tenderness    Lumbar extension is full without pain or tenderness   Left lateral bend is Normal  without pain or tenderness   Right lateral bend is Normal without pain or tenderness   Straight leg raising is Normal   Strength  & tone: Normal   Stability overall normal stability     The patient has been educated about the nature of the problem(s) and counseled on treatment options.  The patient appeared to understand what I have discussed and is in agreement with it.  Encounter Diagnoses  Name Primary?  . Chronic right-sided low back pain with right-sided sciatica Yes  . Essential hypertension   . Type 2 diabetes, diet controlled ([place])     PLAN Call if any problems.  Precautions discussed.  Continue current medications.   Return to clinic 6 weeks   Electronically Signed Sanjuana Kava, MD 2/27/20189:06 AM

## 2016-07-01 ENCOUNTER — Telehealth: Payer: Self-pay | Admitting: Orthopaedic Surgery

## 2016-07-01 MED ORDER — HYDROCODONE-ACETAMINOPHEN 7.5-325 MG PO TABS: 1.0000 | ORAL_TABLET | Freq: Four times a day (QID) | ORAL | 0 refills | Status: DC | PRN

## 2016-07-01 NOTE — Telephone Encounter (Signed)
Patient requests refill on Hydrocodone/Acetaminophen 7.5-325   Mgs.  Qty  110 ° °Sig: Take 1 tablet by mouth every 6 (six) hours as needed for moderate pain (Must last 30 days.  Do not drive or operate machinery while taking this medicine.). °

## 2016-07-29 ENCOUNTER — Ambulatory Visit (INDEPENDENT_AMBULATORY_CARE_PROVIDER_SITE_OTHER): Payer: Medicare (Managed Care) | Admitting: Orthopaedic Surgery

## 2016-07-29 VITALS — BP 173/72 | HR 81 | Ht 65.0 in | Wt 208.0 lb

## 2016-07-29 DIAGNOSIS — I10 Essential (primary) hypertension: Secondary | ICD-10-CM

## 2016-07-29 DIAGNOSIS — M5441 Lumbago with sciatica, right side: Secondary | ICD-10-CM | POA: Diagnosis not present

## 2016-07-29 DIAGNOSIS — E119 Type 2 diabetes mellitus without complications: Secondary | ICD-10-CM

## 2016-07-29 DIAGNOSIS — G8929 Other chronic pain: Secondary | ICD-10-CM | POA: Diagnosis not present

## 2016-07-29 MED ORDER — HYDROCODONE-ACETAMINOPHEN 7.5-325 MG PO TABS: 1.0000 | ORAL_TABLET | Freq: Four times a day (QID) | ORAL | 0 refills | Status: DC | PRN

## 2016-07-29 NOTE — Progress Notes (Signed)
Patient Linda Richardson, female DOB:1923-04-22, 83 y.o. BTD:[id number]  Chief Complaint  Patient presents with  . Follow-up    Back pain    HPI  Linda Richardson is a 83 y.o. female who has chronic lower back pain with some right sided sciatica.  She is stable.  She has had less pain over the last month than usual. She is very active.  She does not look or act her age.  She is doing her exercises.She has no weakness. HPI  Body mass index is 34.61 kg/m.  ROS  Review of Systems  Constitutional:       Patient has Diabetes Mellitus. Patient has hypertension. Patient does not have COPD or shortness of breath. Patient has BMI > 35. Patient does not have current smoking history.  HENT: Negative for congestion.   Respiratory: Negative for cough and shortness of breath.   Cardiovascular: Negative for chest pain and leg swelling.  Endocrine: Positive for cold intolerance.  Musculoskeletal: Positive for arthralgias, back pain and myalgias.  Allergic/Immunologic: Negative for environmental allergies.  Psychiatric/Behavioral: The patient is nervous/anxious.     Past Medical History:  Diagnosis Date  . Anxiety   . Bladder cancer ([place])   . Diabetes ([place])   . Dysuria-frequency syndrome   . Glaucoma    2006  . Hyperlipidemia   . Hypertension   . Hypothyroidism   . Obesity     Past Surgical History:  Procedure Laterality Date  . ABDOMINAL HYSTERECTOMY    . Bilateral cataract Surgery     2004  . DILATION AND CURETTAGE OF UTERUS     x3  . Surgery on bladder for cancer    . VESICOVAGINAL FISTULA CLOSURE W/ TAH      Family History  Problem Relation Age of Onset  . Stroke Mother 59  . Diabetes Sister   . Hypertension Sister     Social History Social History  Substance Use Topics  . Smoking status: Former Research scientist (life sciences)  . Smokeless tobacco: Never Used  . Alcohol use No    Allergies  Allergen Reactions  . Avapro [Irbesartan]     Dry cough  . Penicillins   .  Sulfonamide Derivatives     Current Outpatient Prescriptions  Medication Sig Dispense Refill  . acyclovir (ZOVIRAX) 400 MG tablet TAKE 1 TABLET BY MOUTH 3 TIMES DAILY. 21 tablet 0  . amLODipine (NORVASC) 5 MG tablet Take 1 tablet (5 mg total) by mouth daily. 30 tablet 0  . aspirin 81 MG tablet Take 81 mg by mouth daily.    . brimonidine (ALPHAGAN P) 0.1 % SOLN Place 1 drop into both eyes 2 (two) times daily. One drop each eye twice daily as needed     . calcium-vitamin D (OSCAL 500/200 D-3) 500-200 MG-UNIT per tablet Take 1 tablet by mouth 2 (two) times daily. 180 tablet 3  . cephALEXin (KEFLEX) 500 MG capsule Take 1 capsule (500 mg total) by mouth 4 (four) times daily. 40 capsule 0  . citalopram (CELEXA) 20 MG tablet TAKE (1) TABLET BY MOUTH ONCE DAILY. 30 tablet 0  . cyclobenzaprine (FLEXERIL) 10 MG tablet Take 1 tablet (10 mg total) by mouth as directed. 60 tablet 2  . HYDROcodone-acetaminophen (NORCO) 7.5-325 MG tablet Take 1 tablet by mouth every 6 (six) hours as needed for moderate pain (Must last 30 days.Do not drive or operate machinery while taking this medicine.). 65 tablet 0  . ibuprofen (ADVIL,MOTRIN) 600 MG tablet Take 1 tablet (600 mg  total) by mouth every 8 (eight) hours as needed. 100 tablet 5  . levothyroxine (SYNTHROID, LEVOTHROID) 25 MCG tablet TAKE (1) TABLET BY MOUTH ONCE DAILY. 30 tablet 0  . loteprednol (LOTEMAX) 0.5 % ophthalmic suspension Place 1 drop into the left eye 4 (four) times daily.    . metoprolol succinate (TOPROL-XL) 50 MG 24 hr tablet TAKE 1 TABLET ONCE A DAY AFTER A MEAL. 30 tablet 0  . Multiple Vitamin (MULTIVITAMIN) tablet Take 1 tablet by mouth daily.    . pravastatin (PRAVACHOL) 20 MG tablet Take 1 tablet (20 mg total) by mouth daily. 30 tablet 0  . predniSONE (STERAPRED UNI-PAK 21 TAB) 5 MG (21) TBPK tablet Take 6 pills first day; 5 pills second day; 4 pills third day; 3 pills fourth day; 2 pills next day and 1 pill last day. 21 tablet 0  .  spironolactone (ALDACTONE) 50 MG tablet TAKE (1) TABLET TWICE DAILY. 60 tablet 0  . terbinafine (LAMISIL) 250 MG tablet Take 1 tablet (250 mg total) by mouth daily. 7 tablet 0  . vitamin B-12 (CYANOCOBALAMIN) 1000 MCG tablet Take 1,000 mcg by mouth daily.     No current facility-administered medications for this visit.      Physical Exam  Blood pressure (!) 173/72, pulse 81, height 5\' 5"  (1.651 m), weight 208 lb (94.3 kg).  Constitutional: overall normal hygiene, normal nutrition, well developed, normal grooming, normal body habitus. Assistive device:none  Musculoskeletal: gait and station Limp none, muscle tone and strength are normal, no tremors or atrophy is present.  .  Neurological: coordination overall normal.  Deep tendon reflex/nerve stretch intact.  Sensation normal.  Cranial nerves II-XII intact.   Skin:   Normal overall no scars, lesions, ulcers or rashes. No psoriasis.  Psychiatric: Alert and oriented x 3.  Recent memory intact, remote memory unclear.  Normal mood and affect. Well groomed.  Good eye contact.  Cardiovascular: overall no swelling, no varicosities, no edema bilaterally, normal temperatures of the legs and arms, no clubbing, cyanosis and good capillary refill.  Lymphatic: palpation is normal.  Spine/Pelvis examination:  Inspection:  Overall, sacoiliac joint benign and hips nontender; without crepitus or defects.   Thoracic spine inspection: Alignment normal without kyphosis present   Lumbar spine inspection:  Alignment  with normal lumbar lordosis, without scoliosis apparent.   Thoracic spine palpation:  without tenderness of spinal processes   Lumbar spine palpation: with tenderness of lumbar area; without tightness of lumbar muscles    Range of Motion:   Lumbar flexion, forward flexion is 50 without pain or tenderness    Lumbar extension is full without pain or tenderness   Left lateral bend is Normal  without pain or tenderness   Right lateral  bend is Normal without pain or tenderness   Straight leg raising is Normal   Strength & tone: Normal   Stability overall normal stability     The patient has been educated about the nature of the problem(s) and counseled on treatment options.  The patient appeared to understand what I have discussed and is in agreement with it.  Encounter Diagnoses  Name Primary?  . Chronic right-sided low back pain with right-sided sciatica Yes  . Essential hypertension   . Type 2 diabetes, diet controlled ([place])     PLAN Call if any problems.  Precautions discussed.  Continue current medications.   Return to clinic 6 weeks   I have reviewed the [facility] web site prior  to prescribing narcotic medicine for this patient.  Electronically Signed Sanjuana Kava, MD 4/11/20188:55 AM

## 2016-08-27 ENCOUNTER — Ambulatory Visit: Payer: Medicare (Managed Care) | Admitting: Orthopaedic Surgery

## 2016-09-08 ENCOUNTER — Encounter: Payer: Self-pay | Admitting: Orthopaedic Surgery

## 2016-09-08 ENCOUNTER — Ambulatory Visit (INDEPENDENT_AMBULATORY_CARE_PROVIDER_SITE_OTHER): Payer: Medicare (Managed Care) | Admitting: Orthopaedic Surgery

## 2016-09-08 VITALS — BP 97/54 | HR 58 | Temp 97.5°F | Ht 65.0 in | Wt 206.0 lb

## 2016-09-08 DIAGNOSIS — M2550 Pain in unspecified joint: Secondary | ICD-10-CM

## 2016-09-08 DIAGNOSIS — G8929 Other chronic pain: Secondary | ICD-10-CM

## 2016-09-08 DIAGNOSIS — M5441 Lumbago with sciatica, right side: Secondary | ICD-10-CM

## 2016-09-08 MED ORDER — HYDROCODONE-ACETAMINOPHEN 7.5-325 MG PO TABS: 1.0000 | ORAL_TABLET | Freq: Four times a day (QID) | ORAL | 0 refills | Status: DC | PRN

## 2016-09-08 NOTE — Progress Notes (Signed)
Patient IR:Linda Richardson, female DOB:30-Aug-1923, 83 y.o. SAY:[id number]  Chief Complaint  Patient presents with  . Follow-up    Low Back Pain    HPI  Linda Richardson is a 83 y.o. female who has chronic lower back pain with some right sided sciatica.  She has some pain at times with the knees.  Her back is more tender the last week but her knees are better. She has no paresthesias, no trauma.  She is very active. HPI  Body mass index is 34.28 kg/m.  ROS  Review of Systems  Constitutional:       Patient has Diabetes Mellitus. Patient has hypertension. Patient does not have COPD or shortness of breath. Patient has BMI > 35. Patient does not have current smoking history.  HENT: Negative for congestion.   Respiratory: Negative for cough and shortness of breath.   Cardiovascular: Negative for chest pain and leg swelling.  Endocrine: Positive for cold intolerance.  Musculoskeletal: Positive for arthralgias, back pain and myalgias.  Allergic/Immunologic: Negative for environmental allergies.  Psychiatric/Behavioral: The patient is nervous/anxious.     Past Medical History:  Diagnosis Date  . Anxiety   . Bladder cancer ([place])   . Diabetes ([place])   . Dysuria-frequency syndrome   . Glaucoma    2006  . Hyperlipidemia   . Hypertension   . Hypothyroidism   . Obesity     Past Surgical History:  Procedure Laterality Date  . ABDOMINAL HYSTERECTOMY    . Bilateral cataract Surgery     2004  . DILATION AND CURETTAGE OF UTERUS     x3  . Surgery on bladder for cancer    . VESICOVAGINAL FISTULA CLOSURE W/ TAH      Family History  Problem Relation Age of Onset  . Stroke Mother 65  . Diabetes Sister   . Hypertension Sister     Social History Social History  Substance Use Topics  . Smoking status: Former Research scientist (life sciences)  . Smokeless tobacco: Never Used  . Alcohol use No    Allergies  Allergen Reactions  . Avapro [Irbesartan]     Dry cough  . Penicillins   . Sulfonamide  Derivatives     Current Outpatient Prescriptions  Medication Sig Dispense Refill  . acyclovir (ZOVIRAX) 400 MG tablet TAKE 1 TABLET BY MOUTH 3 TIMES DAILY. 21 tablet 0  . amLODipine (NORVASC) 5 MG tablet Take 1 tablet (5 mg total) by mouth daily. 30 tablet 0  . aspirin 81 MG tablet Take 81 mg by mouth daily.    . brimonidine (ALPHAGAN P) 0.1 % SOLN Place 1 drop into both eyes 2 (two) times daily. One drop each eye twice daily as needed     . calcium-vitamin D (OSCAL 500/200 D-3) 500-200 MG-UNIT per tablet Take 1 tablet by mouth 2 (two) times daily. 180 tablet 3  . cephALEXin (KEFLEX) 500 MG capsule Take 1 capsule (500 mg total) by mouth 4 (four) times daily. 40 capsule 0  . citalopram (CELEXA) 20 MG tablet TAKE (1) TABLET BY MOUTH ONCE DAILY. 30 tablet 0  . cyclobenzaprine (FLEXERIL) 10 MG tablet Take 1 tablet (10 mg total) by mouth as directed. 60 tablet 2  . HYDROcodone-acetaminophen (NORCO) 7.5-325 MG tablet Take 1 tablet by mouth every 6 (six) hours as needed for moderate pain (Must last 30 days.Do not drive or operate machinery while taking this medicine.). 65 tablet 0  . ibuprofen (ADVIL,MOTRIN) 600 MG tablet Take 1 tablet (600 mg total) by  mouth every 8 (eight) hours as needed. 100 tablet 5  . levothyroxine (SYNTHROID, LEVOTHROID) 25 MCG tablet TAKE (1) TABLET BY MOUTH ONCE DAILY. 30 tablet 0  . loteprednol (LOTEMAX) 0.5 % ophthalmic suspension Place 1 drop into the left eye 4 (four) times daily.    . metoprolol succinate (TOPROL-XL) 50 MG 24 hr tablet TAKE 1 TABLET ONCE A DAY AFTER A MEAL. 30 tablet 0  . Multiple Vitamin (MULTIVITAMIN) tablet Take 1 tablet by mouth daily.    . pravastatin (PRAVACHOL) 20 MG tablet Take 1 tablet (20 mg total) by mouth daily. 30 tablet 0  . predniSONE (STERAPRED UNI-PAK 21 TAB) 5 MG (21) TBPK tablet Take 6 pills first day; 5 pills second day; 4 pills third day; 3 pills fourth day; 2 pills next day and 1 pill last day. 21 tablet 0  . spironolactone  (ALDACTONE) 50 MG tablet TAKE (1) TABLET TWICE DAILY. 60 tablet 0  . terbinafine (LAMISIL) 250 MG tablet Take 1 tablet (250 mg total) by mouth daily. 7 tablet 0  . vitamin B-12 (CYANOCOBALAMIN) 1000 MCG tablet Take 1,000 mcg by mouth daily.     No current facility-administered medications for this visit.      Physical Exam  Blood pressure (!) 97/54, pulse (!) 58, temperature 97.5 F (36.4 C), height 5\' 5"  (1.651 m), weight 206 lb (93.4 kg).  Constitutional: overall normal hygiene, normal nutrition, well developed, normal grooming, normal body habitus. Assistive device:none  Musculoskeletal: gait and station Limp none, muscle tone and strength are normal, no tremors or atrophy is present.  .  Neurological: coordination overall normal.  Deep tendon reflex/nerve stretch intact.  Sensation normal.  Cranial nerves II-XII intact.   Skin:   Normal overall no scars, lesions, ulcers or rashes. No psoriasis.  Psychiatric: Alert and oriented x 3.  Recent memory intact, remote memory unclear.  Normal mood and affect. Well groomed.  Good eye contact.  Cardiovascular: overall no swelling, no varicosities, no edema bilaterally, normal temperatures of the legs and arms, no clubbing, cyanosis and good capillary refill.  Lymphatic: palpation is normal.  Spine/Pelvis examination:  Inspection:  Overall, sacoiliac joint benign and hips nontender; without crepitus or defects.   Thoracic spine inspection: Alignment normal without kyphosis present   Lumbar spine inspection:  Alignment  with normal lumbar lordosis, without scoliosis apparent.   Thoracic spine palpation:  without tenderness of spinal processes   Lumbar spine palpation: with tenderness of lumbar area; without tightness of lumbar muscles    Range of Motion:   Lumbar flexion, forward flexion is 45 without pain or tenderness    Lumbar extension is 10 without pain or tenderness   Left lateral bend is Normal  without pain or  tenderness   Right lateral bend is Normal without pain or tenderness   Straight leg raising is Normal   Strength & tone: Normal   Stability overall normal stability     The patient has been educated about the nature of the problem(s) and counseled on treatment options.  The patient appeared to understand what I have discussed and is in agreement with it.  Encounter Diagnoses  Name Primary?  . Chronic right-sided low back pain with right-sided sciatica Yes  . Arthralgia of multiple joints     PLAN Call if any problems.  Precautions discussed.  Continue current medications.   Return to clinic 2 months   I have reviewed the [place] web site prior to prescribing narcotic  medicine for this patient.  Electronically Signed Sanjuana Kava, MD 5/22/20189:03 AM

## 2016-09-09 ENCOUNTER — Ambulatory Visit: Payer: Medicare (Managed Care) | Admitting: Orthopaedic Surgery

## 2016-10-08 ENCOUNTER — Telehealth: Payer: Self-pay | Admitting: Orthopaedic Surgery

## 2016-10-08 MED ORDER — HYDROCODONE-ACETAMINOPHEN 7.5-325 MG PO TABS: 1.0000 | ORAL_TABLET | Freq: Four times a day (QID) | ORAL | 0 refills | Status: DC | PRN

## 2016-10-08 NOTE — Telephone Encounter (Signed)
Hydrocodone-Acetaminophen  7.5/325 mg  Qty 65 Tablets °

## 2016-11-10 ENCOUNTER — Ambulatory Visit (INDEPENDENT_AMBULATORY_CARE_PROVIDER_SITE_OTHER): Payer: Medicare (Managed Care) | Admitting: Orthopaedic Surgery

## 2016-11-10 ENCOUNTER — Encounter: Payer: Self-pay | Admitting: Orthopaedic Surgery

## 2016-11-10 VITALS — BP 190/81 | HR 70 | Temp 97.1°F | Ht 65.0 in | Wt 208.0 lb

## 2016-11-10 DIAGNOSIS — G8929 Other chronic pain: Secondary | ICD-10-CM | POA: Diagnosis not present

## 2016-11-10 DIAGNOSIS — M5441 Lumbago with sciatica, right side: Secondary | ICD-10-CM | POA: Diagnosis not present

## 2016-11-10 DIAGNOSIS — M25551 Pain in right hip: Secondary | ICD-10-CM | POA: Diagnosis not present

## 2016-11-10 DIAGNOSIS — E119 Type 2 diabetes mellitus without complications: Secondary | ICD-10-CM

## 2016-11-10 DIAGNOSIS — I10 Essential (primary) hypertension: Secondary | ICD-10-CM

## 2016-11-10 MED ORDER — HYDROCODONE-ACETAMINOPHEN 7.5-325 MG PO TABS: 1.0000 | ORAL_TABLET | Freq: Four times a day (QID) | ORAL | 0 refills | Status: DC | PRN

## 2016-11-10 NOTE — Progress Notes (Signed)
Patient GN:[id number] Linda Richardson, female DOB:1924/04/11, 83 y.o. MVH:[id number]  Chief Complaint  Patient presents with  . Follow-up    Low back pain    HPI  Linda Richardson is a 83 y.o. female who has chronic lower back pain and right hip pain. She is having some neck pain at times.  She is active and is taking her medicine. She has no new trauma, no redness, no paresthesias. HPI  Body mass index is 34.61 kg/m.  ROS  Review of Systems  Constitutional:       Patient has Diabetes Mellitus. Patient has hypertension. Patient does not have COPD or shortness of breath. Patient has BMI > 35. Patient does not have current smoking history.  HENT: Negative for congestion.   Respiratory: Negative for cough and shortness of breath.   Cardiovascular: Negative for chest pain and leg swelling.  Endocrine: Positive for cold intolerance.  Musculoskeletal: Positive for arthralgias, back pain and myalgias.  Allergic/Immunologic: Negative for environmental allergies.  Psychiatric/Behavioral: The patient is nervous/anxious.     Past Medical History:  Diagnosis Date  . Anxiety   . Bladder cancer ([place])   . Diabetes ([place])   . Dysuria-frequency syndrome   . Glaucoma    2006  . Hyperlipidemia   . Hypertension   . Hypothyroidism   . Obesity     Past Surgical History:  Procedure Laterality Date  . ABDOMINAL HYSTERECTOMY    . Bilateral cataract Surgery     2004  . DILATION AND CURETTAGE OF UTERUS     x3  . Surgery on bladder for cancer    . VESICOVAGINAL FISTULA CLOSURE W/ TAH      Family History  Problem Relation Age of Onset  . Stroke Mother 67  . Diabetes Sister   . Hypertension Sister     Social History Social History  Substance Use Topics  . Smoking status: Former Research scientist (life sciences)  . Smokeless tobacco: Never Used  . Alcohol use No    Allergies  Allergen Reactions  . Avapro [Irbesartan]     Dry cough  . Penicillins   . Sulfonamide Derivatives     Current Outpatient  Prescriptions  Medication Sig Dispense Refill  . acyclovir (ZOVIRAX) 400 MG tablet TAKE 1 TABLET BY MOUTH 3 TIMES DAILY. 21 tablet 0  . amLODipine (NORVASC) 5 MG tablet Take 1 tablet (5 mg total) by mouth daily. 30 tablet 0  . aspirin 81 MG tablet Take 81 mg by mouth daily.    . brimonidine (ALPHAGAN P) 0.1 % SOLN Place 1 drop into both eyes 2 (two) times daily. One drop each eye twice daily as needed     . calcium-vitamin D (OSCAL 500/200 D-3) 500-200 MG-UNIT per tablet Take 1 tablet by mouth 2 (two) times daily. 180 tablet 3  . cephALEXin (KEFLEX) 500 MG capsule Take 1 capsule (500 mg total) by mouth 4 (four) times daily. 40 capsule 0  . citalopram (CELEXA) 20 MG tablet TAKE (1) TABLET BY MOUTH ONCE DAILY. 30 tablet 0  . cyclobenzaprine (FLEXERIL) 10 MG tablet Take 1 tablet (10 mg total) by mouth as directed. 60 tablet 2  . HYDROcodone-acetaminophen (NORCO) 7.5-325 MG tablet Take 1 tablet by mouth every 6 (six) hours as needed for moderate pain (Must last 30 days.Do not drive or operate machinery while taking this medicine.). 65 tablet 0  . ibuprofen (ADVIL,MOTRIN) 600 MG tablet Take 1 tablet (600 mg total) by mouth every 8 (eight) hours as needed. 100 tablet 5  .  levothyroxine (SYNTHROID, LEVOTHROID) 25 MCG tablet TAKE (1) TABLET BY MOUTH ONCE DAILY. 30 tablet 0  . loteprednol (LOTEMAX) 0.5 % ophthalmic suspension Place 1 drop into the left eye 4 (four) times daily.    . metoprolol succinate (TOPROL-XL) 50 MG 24 hr tablet TAKE 1 TABLET ONCE A DAY AFTER A MEAL. 30 tablet 0  . Multiple Vitamin (MULTIVITAMIN) tablet Take 1 tablet by mouth daily.    . pravastatin (PRAVACHOL) 20 MG tablet Take 1 tablet (20 mg total) by mouth daily. 30 tablet 0  . predniSONE (STERAPRED UNI-PAK 21 TAB) 5 MG (21) TBPK tablet Take 6 pills first day; 5 pills second day; 4 pills third day; 3 pills fourth day; 2 pills next day and 1 pill last day. 21 tablet 0  . spironolactone (ALDACTONE) 50 MG tablet TAKE (1) TABLET  TWICE DAILY. 60 tablet 0  . terbinafine (LAMISIL) 250 MG tablet Take 1 tablet (250 mg total) by mouth daily. 7 tablet 0  . vitamin B-12 (CYANOCOBALAMIN) 1000 MCG tablet Take 1,000 mcg by mouth daily.     No current facility-administered medications for this visit.      Physical Exam  Blood pressure (!) 190/81, pulse 70, temperature (!) 97.1 F (36.2 C), height 5\' 5"  (1.651 m), weight 208 lb (94.3 kg).  Constitutional: overall normal hygiene, normal nutrition, well developed, normal grooming, normal body habitus. Assistive device:none  Musculoskeletal: gait and station Limp none, muscle tone and strength are normal, no tremors or atrophy is present.  .  Neurological: coordination overall normal.  Deep tendon reflex/nerve stretch intact.  Sensation normal.  Cranial nerves II-XII intact.   Skin:   Normal overall no scars, lesions, ulcers or rashes. No psoriasis.  Psychiatric: Alert and oriented x 3.  Recent memory intact, remote memory unclear.  Normal mood and affect. Well groomed.  Good eye contact.  Cardiovascular: overall no swelling, no varicosities, no edema bilaterally, normal temperatures of the legs and arms, no clubbing, cyanosis and good capillary refill.  Lymphatic: palpation is normal.  Spine/Pelvis examination:  Inspection:  Overall, sacoiliac joint benign and hips nontender; without crepitus or defects.   Thoracic spine inspection: Alignment normal without kyphosis present   Lumbar spine inspection:  Alignment  with normal lumbar lordosis, without scoliosis apparent.   Thoracic spine palpation:  without tenderness of spinal processes   Lumbar spine palpation: with tenderness of lumbar area; without tightness of lumbar muscles    Range of Motion:   Lumbar flexion, forward flexion is 45 without pain or tenderness    Lumbar extension is full without pain or tenderness   Left lateral bend is Normal  without pain or tenderness   Right lateral bend is Normal without  pain or tenderness   Straight leg raising is Normal   Strength & tone: Normal   Stability overall normal stability     The patient has been educated about the nature of the problem(s) and counseled on treatment options.  The patient appeared to understand what I have discussed and is in agreement with it.  Encounter Diagnoses  Name Primary?  . Chronic right-sided low back pain with right-sided sciatica Yes  . Type 2 diabetes, diet controlled ([place])   . Essential hypertension   . Right hip pain     PLAN Call if any problems.  Precautions discussed.  Continue current medications.   Return to clinic 2 months   I have reviewed the [place] web site prior to prescribing  narcotic medicine for this patient.  Electronically Signed Sanjuana Kava, MD 7/24/20189:24 AM

## 2016-12-15 ENCOUNTER — Telehealth: Payer: Self-pay | Admitting: *Deleted

## 2016-12-15 NOTE — Telephone Encounter (Signed)
Patient request a refill of her HYDROcodone-acetaminophen (NORCO) 7.5-325 MG tablet     Sig - Route: Take 1 tablet by mouth every 6 (six) hours as needed for moderate pain (Must last 30 days

## 2016-12-16 MED ORDER — HYDROCODONE-ACETAMINOPHEN 7.5-325 MG PO TABS: 1.0000 | ORAL_TABLET | Freq: Four times a day (QID) | ORAL | 0 refills | Status: DC | PRN

## 2017-01-12 ENCOUNTER — Ambulatory Visit (INDEPENDENT_AMBULATORY_CARE_PROVIDER_SITE_OTHER): Payer: Medicare (Managed Care) | Admitting: Orthopaedic Surgery

## 2017-01-12 ENCOUNTER — Encounter: Payer: Self-pay | Admitting: Orthopaedic Surgery

## 2017-01-12 VITALS — BP 170/83 | HR 67 | Temp 98.1°F | Resp 16 | Ht 65.0 in | Wt 208.0 lb

## 2017-01-12 DIAGNOSIS — E119 Type 2 diabetes mellitus without complications: Secondary | ICD-10-CM | POA: Diagnosis not present

## 2017-01-12 DIAGNOSIS — M2550 Pain in unspecified joint: Secondary | ICD-10-CM

## 2017-01-12 DIAGNOSIS — M5441 Lumbago with sciatica, right side: Secondary | ICD-10-CM | POA: Diagnosis not present

## 2017-01-12 DIAGNOSIS — I10 Essential (primary) hypertension: Secondary | ICD-10-CM

## 2017-01-12 DIAGNOSIS — G8929 Other chronic pain: Secondary | ICD-10-CM

## 2017-01-12 MED ORDER — HYDROCODONE-ACETAMINOPHEN 7.5-325 MG PO TABS: 1.0000 | ORAL_TABLET | Freq: Four times a day (QID) | ORAL | 0 refills | Status: DC | PRN

## 2017-01-12 NOTE — Progress Notes (Signed)
Patient Linda Richardson, female DOB:[birth year], 83 y.o. FAO:[id number]  Chief Complaint  Patient presents with  . Back Pain    same / worse with standing/ ok with sitting     HPI  Linda Richardson is a 83 y.o. female who has hip pain on the right and some back pain.  She has no new trauma, no redness, no numbness.  She is very active and has a part time job, at her age. HPI  Body mass index is 34.61 kg/m.  ROS  Review of Systems  Constitutional:       Patient has Diabetes Mellitus. Patient has hypertension. Patient does not have COPD or shortness of breath. Patient has BMI > 35. Patient does not have current smoking history.  HENT: Negative for congestion.   Respiratory: Negative for cough and shortness of breath.   Cardiovascular: Negative for chest pain and leg swelling.  Endocrine: Positive for cold intolerance.  Musculoskeletal: Positive for arthralgias, back pain and myalgias.  Allergic/Immunologic: Negative for environmental allergies.  Psychiatric/Behavioral: The patient is nervous/anxious.     Past Medical History:  Diagnosis Date  . Anxiety   . Bladder cancer ([place])   . Diabetes ([place])   . Dysuria-frequency syndrome   . Glaucoma    2006  . Hyperlipidemia   . Hypertension   . Hypothyroidism   . Obesity     Past Surgical History:  Procedure Laterality Date  . ABDOMINAL HYSTERECTOMY    . Bilateral cataract Surgery     2004  . DILATION AND CURETTAGE OF UTERUS     x3  . Surgery on bladder for cancer    . VESICOVAGINAL FISTULA CLOSURE W/ TAH      Family History  Problem Relation Age of Onset  . Stroke Mother 38  . Diabetes Sister   . Hypertension Sister     Social History Social History  Substance Use Topics  . Smoking status: Former Research scientist (life sciences)  . Smokeless tobacco: Never Used  . Alcohol use No    Allergies  Allergen Reactions  . Avapro [Irbesartan]     Dry cough  . Penicillins   . Sulfonamide Derivatives     Current Outpatient  Prescriptions  Medication Sig Dispense Refill  . amLODipine (NORVASC) 5 MG tablet Take 1 tablet (5 mg total) by mouth daily. 30 tablet 0  . aspirin 81 MG tablet Take 81 mg by mouth daily.    . brimonidine (ALPHAGAN P) 0.1 % SOLN Place 1 drop into both eyes 2 (two) times daily. One drop each eye twice daily as needed     . citalopram (CELEXA) 20 MG tablet TAKE (1) TABLET BY MOUTH ONCE DAILY. 30 tablet 0  . cyclobenzaprine (FLEXERIL) 10 MG tablet Take 1 tablet (10 mg total) by mouth as directed. 60 tablet 2  . furosemide (LASIX) 40 MG tablet     . gabapentin (NEURONTIN) 300 MG capsule     . HYDROcodone-acetaminophen (NORCO) 7.5-325 MG tablet Take 1 tablet by mouth every 6 (six) hours as needed for moderate pain (Must last 30 days.Do not drive or operate machinery while taking this medicine.). 60 tablet 0  . ibuprofen (ADVIL,MOTRIN) 600 MG tablet Take 1 tablet (600 mg total) by mouth every 8 (eight) hours as needed. 100 tablet 5  . levothyroxine (SYNTHROID, LEVOTHROID) 50 MCG tablet     . loteprednol (LOTEMAX) 0.5 % ophthalmic suspension Place 1 drop into the left eye 4 (four) times daily.    . metoprolol succinate (TOPROL-XL)  50 MG 24 hr tablet TAKE 1 TABLET ONCE A DAY AFTER A MEAL. 30 tablet 0  . Multiple Vitamin (MULTIVITAMIN) tablet Take 1 tablet by mouth daily.    Marland Kitchen omeprazole (PRILOSEC) 20 MG capsule     . potassium chloride (MICRO-K) 10 MEQ CR capsule     . pravastatin (PRAVACHOL) 20 MG tablet Take 1 tablet (20 mg total) by mouth daily. 30 tablet 0  . spironolactone (ALDACTONE) 50 MG tablet TAKE (1) TABLET TWICE DAILY. 60 tablet 0  . valACYclovir (VALTREX) 1000 MG tablet     . vitamin B-12 (CYANOCOBALAMIN) 1000 MCG tablet Take 1,000 mcg by mouth daily.    Marland Kitchen acyclovir (ZOVIRAX) 400 MG tablet TAKE 1 TABLET BY MOUTH 3 TIMES DAILY. (Patient not taking: Reported on 01/12/2017) 21 tablet 0  . calcium-vitamin D (OSCAL 500/200 D-3) 500-200 MG-UNIT per tablet Take 1 tablet by mouth 2 (two) times  daily. 180 tablet 3  . levothyroxine (SYNTHROID, LEVOTHROID) 25 MCG tablet TAKE (1) TABLET BY MOUTH ONCE DAILY. (Patient not taking: Reported on 01/12/2017) 30 tablet 0   No current facility-administered medications for this visit.      Physical Exam  Blood pressure (!) 170/83, pulse 67, temperature 98.1 F (36.7 C), resp. rate 16, height 5\' 5"  (1.651 m), weight 208 lb (94.3 kg).  Constitutional: overall normal hygiene, normal nutrition, well developed, normal grooming, normal body habitus. Assistive device:none  Musculoskeletal: gait and station Limp right, muscle tone and strength are normal, no tremors or atrophy is present.  .  Neurological: coordination overall normal.  Deep tendon reflex/nerve stretch intact.  Sensation normal.  Cranial nerves II-XII intact.   Skin:   Normal overall no scars, lesions, ulcers or rashes. No psoriasis.  Psychiatric: Alert and oriented x 3.  Recent memory intact, remote memory unclear.  Normal mood and affect. Well groomed.  Good eye contact.  Cardiovascular: overall no swelling, no varicosities, no edema bilaterally, normal temperatures of the legs and arms, no clubbing, cyanosis and good capillary refill.  Lymphatic: palpation is normal.  All other systems reviewed and are negative   Right hip has some slight lateral tenderness.  Gait is good and ROM is full, NV intact.  The patient has been educated about the nature of the problem(s) and counseled on treatment options.  The patient appeared to understand what I have discussed and is in agreement with it.  Encounter Diagnoses  Name Primary?  . Chronic right-sided low back pain with right-sided sciatica Yes  . Essential hypertension   . Arthralgia of multiple joints   . Type 2 diabetes, diet controlled ([place])     PLAN Call if any problems.  Precautions discussed.  Continue current medications.   Return to clinic 3 months   I have reviewed the [place] web site prior to prescribing narcotic medicine for this patient.  Electronically Signed Sanjuana Kava, MD 9/25/20188:25 AM

## 2017-03-19 ENCOUNTER — Telehealth: Payer: Self-pay | Admitting: Orthopaedic Surgery

## 2017-03-19 NOTE — Telephone Encounter (Signed)
Hydrocodone-Acetaminophen  7.5/325 mg    Qty 60 Tablets °

## 2017-03-22 MED ORDER — HYDROCODONE-ACETAMINOPHEN 7.5-325 MG PO TABS: 1.0000 | ORAL_TABLET | Freq: Four times a day (QID) | ORAL | 0 refills | Status: DC | PRN

## 2017-04-08 ENCOUNTER — Encounter: Payer: Self-pay | Admitting: Orthopaedic Surgery

## 2017-04-08 ENCOUNTER — Ambulatory Visit: Payer: Medicare (Managed Care) | Admitting: Orthopaedic Surgery

## 2017-04-08 VITALS — BP 164/79 | HR 79 | Temp 98.8°F | Ht 65.0 in | Wt 210.0 lb

## 2017-04-08 DIAGNOSIS — I10 Essential (primary) hypertension: Secondary | ICD-10-CM | POA: Diagnosis not present

## 2017-04-08 DIAGNOSIS — G8929 Other chronic pain: Secondary | ICD-10-CM

## 2017-04-08 DIAGNOSIS — M2550 Pain in unspecified joint: Secondary | ICD-10-CM | POA: Diagnosis not present

## 2017-04-08 DIAGNOSIS — M5441 Lumbago with sciatica, right side: Secondary | ICD-10-CM | POA: Diagnosis not present

## 2017-04-08 MED ORDER — HYDROCODONE-ACETAMINOPHEN 7.5-325 MG PO TABS: 1.0000 | ORAL_TABLET | Freq: Four times a day (QID) | ORAL | 0 refills | Status: DC | PRN

## 2017-04-08 NOTE — Progress Notes (Signed)
Patient Linda Richardson, female DOB:[birth year], 83 y.o. BOF:[id number]  Chief Complaint  Patient presents with  . Hip Pain    right     HPI  Linda Richardson is a 83 y.o. female who has multiple arthralgias, more of the right hip today. She has no new trauma. She is active.  The right shoulder is tender too but better.  She has no numbness, no weakness.  She is taking her medicine. HPI  Body mass index is 34.95 kg/m.  ROS  Review of Systems  Constitutional:       Patient has Diabetes Mellitus. Patient has hypertension. Patient does not have COPD or shortness of breath. Patient has BMI > 35. Patient does not have current smoking history.  HENT: Negative for congestion.   Respiratory: Negative for cough and shortness of breath.   Cardiovascular: Negative for chest pain and leg swelling.  Endocrine: Positive for cold intolerance.  Musculoskeletal: Positive for arthralgias, back pain and myalgias.  Allergic/Immunologic: Negative for environmental allergies.  Psychiatric/Behavioral: The patient is nervous/anxious.   All other systems reviewed and are negative.   Past Medical History:  Diagnosis Date  . Anxiety   . Bladder cancer ([place])   . Diabetes ([place])   . Dysuria-frequency syndrome   . Glaucoma    2006  . Hyperlipidemia   . Hypertension   . Hypothyroidism   . Obesity     Past Surgical History:  Procedure Laterality Date  . ABDOMINAL HYSTERECTOMY    . Bilateral cataract Surgery     2004  . DILATION AND CURETTAGE OF UTERUS     x3  . Surgery on bladder for cancer    . VESICOVAGINAL FISTULA CLOSURE W/ TAH      Family History  Problem Relation Age of Onset  . Stroke Mother 52  . Diabetes Sister   . Hypertension Sister     Social History Social History   Tobacco Use  . Smoking status: Former Research scientist (life sciences)  . Smokeless tobacco: Never Used  Substance Use Topics  . Alcohol use: No  . Drug use: No    Allergies  Allergen Reactions  . Avapro [Irbesartan]      Dry cough  . Penicillins   . Sulfonamide Derivatives     Current Outpatient Medications  Medication Sig Dispense Refill  . amLODipine (NORVASC) 5 MG tablet Take 1 tablet (5 mg total) by mouth daily. 30 tablet 0  . aspirin 81 MG tablet Take 81 mg by mouth daily.    . brimonidine (ALPHAGAN P) 0.1 % SOLN Place 1 drop into both eyes 2 (two) times daily. One drop each eye twice daily as needed     . citalopram (CELEXA) 20 MG tablet TAKE (1) TABLET BY MOUTH ONCE DAILY. 30 tablet 0  . cyclobenzaprine (FLEXERIL) 10 MG tablet Take 1 tablet (10 mg total) by mouth as directed. 60 tablet 2  . furosemide (LASIX) 40 MG tablet     . gabapentin (NEURONTIN) 300 MG capsule     . HYDROcodone-acetaminophen (NORCO) 7.5-325 MG tablet Take 1 tablet by mouth every 6 (six) hours as needed for moderate pain (Must last 30 days.Do not drive or operate machinery while taking this medicine.). 60 tablet 0  . ibuprofen (ADVIL,MOTRIN) 600 MG tablet Take 1 tablet (600 mg total) by mouth every 8 (eight) hours as needed. 100 tablet 5  . levothyroxine (SYNTHROID, LEVOTHROID) 50 MCG tablet     . loteprednol (LOTEMAX) 0.5 % ophthalmic suspension Place 1 drop into  the left eye 4 (four) times daily.    . metoprolol succinate (TOPROL-XL) 50 MG 24 hr tablet TAKE 1 TABLET ONCE A DAY AFTER A MEAL. 30 tablet 0  . Multiple Vitamin (MULTIVITAMIN) tablet Take 1 tablet by mouth daily.    Marland Kitchen omeprazole (PRILOSEC) 20 MG capsule     . potassium chloride (MICRO-K) 10 MEQ CR capsule     . pravastatin (PRAVACHOL) 20 MG tablet Take 1 tablet (20 mg total) by mouth daily. 30 tablet 0  . spironolactone (ALDACTONE) 50 MG tablet TAKE (1) TABLET TWICE DAILY. 60 tablet 0  . valACYclovir (VALTREX) 1000 MG tablet     . vitamin B-12 (CYANOCOBALAMIN) 1000 MCG tablet Take 1,000 mcg by mouth daily.    Marland Kitchen acyclovir (ZOVIRAX) 400 MG tablet TAKE 1 TABLET BY MOUTH 3 TIMES DAILY. (Patient not taking: Reported on 01/12/2017) 21 tablet 0  . calcium-vitamin D  (OSCAL 500/200 D-3) 500-200 MG-UNIT per tablet Take 1 tablet by mouth 2 (two) times daily. 180 tablet 3  . levothyroxine (SYNTHROID, LEVOTHROID) 25 MCG tablet TAKE (1) TABLET BY MOUTH ONCE DAILY. (Patient not taking: Reported on 04/08/2017) 30 tablet 0   No current facility-administered medications for this visit.      Physical Exam  Blood pressure (!) 164/79, pulse 79, temperature 98.8 F (37.1 C), height 5\' 5"  (1.651 m), weight 210 lb (95.3 kg).  Constitutional: overall normal hygiene, normal nutrition, well developed, normal grooming, normal body habitus. Assistive device:none  Musculoskeletal: gait and station Limp none, muscle tone and strength are normal, no tremors or atrophy is present.  .  Neurological: coordination overall normal.  Deep tendon reflex/nerve stretch intact.  Sensation normal.  Cranial nerves II-XII intact.   Skin:   Normal overall no scars, lesions, ulcers or rashes. No psoriasis.  Psychiatric: Alert and oriented x 3.  Recent memory intact, remote memory unclear.  Normal mood and affect. Well groomed.  Good eye contact.  Cardiovascular: overall no swelling, no varicosities, no edema bilaterally, normal temperatures of the legs and arms, no clubbing, cyanosis and good capillary refill.  Lymphatic: palpation is normal.  All other systems reviewed and are negative   The hip on the right is tender but has very good motion.  NV intact.  She has no limp.  Her right shoulder is tender but has good motion.  NV intact.  The patient has been educated about the nature of the problem(s) and counseled on treatment options.  The patient appeared to understand what I have discussed and is in agreement with it.  Encounter Diagnoses  Name Primary?  . Chronic right-sided low back pain with right-sided sciatica Yes  . Essential hypertension   . Arthralgia of multiple joints     PLAN Call if any problems.  Precautions discussed.  Continue current medications.    Return to clinic 2 months   I have reviewed the [place] web site prior to prescribing narcotic medicine for this patient.  Electronically Signed Sanjuana Kava, MD 12/20/20188:37 AM

## 2017-04-14 ENCOUNTER — Ambulatory Visit: Payer: Medicare (Managed Care) | Admitting: Orthopaedic Surgery

## 2017-05-04 ENCOUNTER — Telehealth: Payer: Self-pay | Admitting: Orthopaedic Surgery

## 2017-05-04 NOTE — Telephone Encounter (Signed)
Received a fax refill request for Hydrcodone-APAP  7.5/325 MG    Qty 60 Tablets from [facility]. Last filled 04/08/17.

## 2017-05-05 MED ORDER — HYDROCODONE-ACETAMINOPHEN 7.5-325 MG PO TABS: 1.0000 | ORAL_TABLET | Freq: Four times a day (QID) | ORAL | 0 refills | Status: DC | PRN

## 2017-05-07 ENCOUNTER — Telehealth: Payer: Self-pay | Admitting: Orthopaedic Surgery

## 2017-05-07 NOTE — Telephone Encounter (Signed)
Pt is asking for a refill of Ibuprofen 600 MG   Patient uses [facility] in [place]

## 2017-05-11 MED ORDER — IBUPROFEN 600 MG PO TABS: 600.0000 mg | ORAL_TABLET | Freq: Three times a day (TID) | ORAL | 5 refills | Status: DC | PRN

## 2017-05-20 ENCOUNTER — Ambulatory Visit (INDEPENDENT_AMBULATORY_CARE_PROVIDER_SITE_OTHER): Payer: Medicare (Managed Care) | Admitting: Otolaryngology

## 2017-05-20 DIAGNOSIS — H6121 Impacted cerumen, right ear: Secondary | ICD-10-CM

## 2017-05-20 DIAGNOSIS — H903 Sensorineural hearing loss, bilateral: Secondary | ICD-10-CM

## 2017-06-10 ENCOUNTER — Ambulatory Visit: Payer: Medicare (Managed Care) | Admitting: Orthopaedic Surgery

## 2017-06-25 ENCOUNTER — Other Ambulatory Visit: Payer: Self-pay | Admitting: Orthopedic Surgery

## 2017-06-25 NOTE — Telephone Encounter (Signed)
Patient (daughter, designated contact) called for refill:  HYDROcodone-acetaminophen ([name]) 7.5-325 MG tablet 60 tablet  -patient re-scheduled her last appointment to 09/01/17 with Dr Luna Glasgow. Aware that Dr Aline Brochure is reviewing prescription refill requests while Dr Luna Glasgow is out of clinic.  Pharmacy is [name], [name].

## 2017-06-27 ENCOUNTER — Other Ambulatory Visit: Payer: Self-pay | Admitting: Orthopedic Surgery

## 2017-06-27 MED ORDER — HYDROCODONE-ACETAMINOPHEN 7.5-325 MG PO TABS: 1.0000 | ORAL_TABLET | Freq: Four times a day (QID) | ORAL | 0 refills | Status: DC | PRN

## 2017-08-05 ENCOUNTER — Other Ambulatory Visit: Payer: Self-pay | Admitting: Orthopaedic Surgery

## 2017-08-05 MED ORDER — HYDROCODONE-ACETAMINOPHEN 7.5-325 MG PO TABS: 1.0000 | ORAL_TABLET | Freq: Four times a day (QID) | ORAL | 0 refills | Status: DC | PRN

## 2017-08-05 NOTE — Telephone Encounter (Signed)
Hydrocodone-Acetaminophen  7.5/325 mg  Qty  60 Tablets  Take 1 tablet by mouth every 6 (six) hours as needed for moderate pain.  PATIENT USES [facility]

## 2017-09-01 ENCOUNTER — Encounter: Payer: Self-pay | Admitting: Orthopaedic Surgery

## 2017-09-01 ENCOUNTER — Ambulatory Visit: Payer: Medicare (Managed Care) | Admitting: Orthopaedic Surgery

## 2017-09-01 VITALS — BP 143/90 | HR 63 | Ht 65.0 in | Wt 210.0 lb

## 2017-09-01 DIAGNOSIS — G8929 Other chronic pain: Secondary | ICD-10-CM

## 2017-09-01 DIAGNOSIS — I10 Essential (primary) hypertension: Secondary | ICD-10-CM | POA: Diagnosis not present

## 2017-09-01 DIAGNOSIS — M2550 Pain in unspecified joint: Secondary | ICD-10-CM | POA: Diagnosis not present

## 2017-09-01 DIAGNOSIS — M25511 Pain in right shoulder: Secondary | ICD-10-CM | POA: Diagnosis not present

## 2017-09-01 DIAGNOSIS — M5441 Lumbago with sciatica, right side: Secondary | ICD-10-CM | POA: Diagnosis not present

## 2017-09-01 DIAGNOSIS — M25512 Pain in left shoulder: Secondary | ICD-10-CM

## 2017-09-01 MED ORDER — HYDROCODONE-ACETAMINOPHEN 7.5-325 MG PO TABS: 1.0000 | ORAL_TABLET | Freq: Four times a day (QID) | ORAL | 0 refills | Status: DC | PRN

## 2017-09-01 NOTE — Progress Notes (Signed)
Patient KX:FGHWEXH Linda Richardson, female DOB:[birth year], 84 y.o. [id number]  Chief Complaint  Patient presents with  . Shoulder Pain    right  . Hip Pain    right   . Medication Refill    Hydrocodone     HPI  Linda Richardson is a 84 y.o. female who has multiple arthralgias but more pain of the lower back and the right shoulder.  She has no new trauma.  The lower back has more pain on the right side.  She has paresthesias at times.  Some days are worse than others.  She is amazingly active for her age.  She does exercises and takes her medicine.  She has no weakness.  The right shoulder has been more tender the last few weeks.  She denies any injury.  She has more pain with overhead use or moving her arm laterally to pick up something.  She has no weakness or numbness.  She has no neck pain. HPI  Body mass index is 34.95 kg/m.  ROS  Review of Systems  Constitutional:       Patient has Diabetes Mellitus. Patient has hypertension. Patient does not have COPD or shortness of breath. Patient has BMI > 35. Patient does not have current smoking history.  HENT: Negative for congestion.   Respiratory: Negative for cough and shortness of breath.   Cardiovascular: Negative for chest pain and leg swelling.  Endocrine: Positive for cold intolerance.  Musculoskeletal: Positive for arthralgias, back pain and myalgias.  Allergic/Immunologic: Negative for environmental allergies.  Psychiatric/Behavioral: The patient is nervous/anxious.   All other systems reviewed and are negative.   Past Medical History:  Diagnosis Date  . Anxiety   . Bladder cancer ([place])   . Diabetes ([place])   . Dysuria-frequency syndrome   . Glaucoma    2006  . Hyperlipidemia   . Hypertension   . Hypothyroidism   . Obesity     Past Surgical History:  Procedure Laterality Date  . ABDOMINAL HYSTERECTOMY    . Bilateral cataract Surgery     2004  . DILATION AND CURETTAGE OF UTERUS     x3  . Surgery on  bladder for cancer    . VESICOVAGINAL FISTULA CLOSURE W/ TAH      Family History  Problem Relation Age of Onset  . Stroke Mother 32  . Diabetes Sister   . Hypertension Sister     Social History Social History   Tobacco Use  . Smoking status: Former Research scientist (life sciences)  . Smokeless tobacco: Never Used  Substance Use Topics  . Alcohol use: No  . Drug use: No    Allergies  Allergen Reactions  . Avapro [Irbesartan]     Dry cough  . Penicillins   . Sulfonamide Derivatives     Current Outpatient Medications  Medication Sig Dispense Refill  . acyclovir (ZOVIRAX) 400 MG tablet TAKE 1 TABLET BY MOUTH 3 TIMES DAILY. 21 tablet 0  . amLODipine (NORVASC) 5 MG tablet Take 1 tablet (5 mg total) by mouth daily. 30 tablet 0  . aspirin 81 MG tablet Take 81 mg by mouth daily.    . brimonidine (ALPHAGAN P) 0.1 % SOLN Place 1 drop into both eyes 2 (two) times daily. One drop each eye twice daily as needed     . citalopram (CELEXA) 20 MG tablet TAKE (1) TABLET BY MOUTH ONCE DAILY. 30 tablet 0  . cyclobenzaprine (FLEXERIL) 10 MG tablet Take 1 tablet (10 mg total) by mouth as  directed. 60 tablet 2  . furosemide (LASIX) 40 MG tablet     . gabapentin (NEURONTIN) 300 MG capsule     . HYDROcodone-acetaminophen (NORCO) 7.5-325 MG tablet Take 1 tablet by mouth every 6 (six) hours as needed for moderate pain. 60 tablet 0  . ibuprofen (ADVIL,MOTRIN) 600 MG tablet Take 1 tablet (600 mg total) by mouth every 8 (eight) hours as needed. 100 tablet 5  . levothyroxine (SYNTHROID, LEVOTHROID) 25 MCG tablet TAKE (1) TABLET BY MOUTH ONCE DAILY. 30 tablet 0  . levothyroxine (SYNTHROID, LEVOTHROID) 50 MCG tablet     . loteprednol (LOTEMAX) 0.5 % ophthalmic suspension Place 1 drop into the left eye 4 (four) times daily.    . metoprolol succinate (TOPROL-XL) 50 MG 24 hr tablet TAKE 1 TABLET ONCE A DAY AFTER A MEAL. 30 tablet 0  . Multiple Vitamin (MULTIVITAMIN) tablet Take 1 tablet by mouth daily.    Marland Kitchen omeprazole (PRILOSEC) 20  MG capsule     . potassium chloride (MICRO-K) 10 MEQ CR capsule     . pravastatin (PRAVACHOL) 20 MG tablet Take 1 tablet (20 mg total) by mouth daily. 30 tablet 0  . spironolactone (ALDACTONE) 50 MG tablet TAKE (1) TABLET TWICE DAILY. 60 tablet 0  . valACYclovir (VALTREX) 1000 MG tablet     . vitamin B-12 (CYANOCOBALAMIN) 1000 MCG tablet Take 1,000 mcg by mouth daily.    . calcium-vitamin D (OSCAL 500/200 D-3) 500-200 MG-UNIT per tablet Take 1 tablet by mouth 2 (two) times daily. 180 tablet 3   No current facility-administered medications for this visit.      Physical Exam  Blood pressure (!) 143/90, pulse 63, height 5\' 5"  (1.651 m), weight 210 lb (95.3 kg).  Constitutional: overall normal hygiene, normal nutrition, well developed, normal grooming, normal body habitus. Assistive device:none  Musculoskeletal: gait and station Limp none, muscle tone and strength are normal, no tremors or atrophy is present.  .  Neurological: coordination overall normal.  Deep tendon reflex/nerve stretch intact.  Sensation normal.  Cranial nerves II-XII intact.   Skin:   Normal overall no scars, lesions, ulcers or rashes. No psoriasis.  Psychiatric: Alert and oriented x 3.  Recent memory intact, remote memory unclear.  Normal mood and affect. Well groomed.  Good eye contact.  Cardiovascular: overall no swelling, no varicosities, no edema bilaterally, normal temperatures of the legs and arms, no clubbing, cyanosis and good capillary refill.  Lymphatic: palpation is normal.  Spine/Pelvis examination:  Inspection:  Overall, sacoiliac joint benign and hips nontender; without crepitus or defects.   Thoracic spine inspection: Alignment normal without kyphosis present   Lumbar spine inspection:  Alignment  with normal lumbar lordosis, without scoliosis apparent.   Thoracic spine palpation:  without tenderness of spinal processes   Lumbar spine palpation: without tenderness of lumbar area; without  tightness of lumbar muscles    Range of Motion:   Lumbar flexion, forward flexion is normal without pain or tenderness    Lumbar extension is full without pain or tenderness   Left lateral bend is normal without pain or tenderness   Right lateral bend is normal without pain or tenderness   Straight leg raising is normal  Strength & tone: normal   Stability overall normal stability  Examination of right Upper Extremity is done.  Inspection:   Overall:  Elbow non-tender without crepitus or defects, forearm non-tender without crepitus or defects, wrist non-tender without crepitus or defects, hand non-tender.    Shoulder: with glenohumeral  joint tenderness, without effusion.   Upper arm: without swelling and tenderness   Range of motion:   Overall:  Full range of motion of the elbow, full range of motion of wrist and full range of motion in fingers.   Shoulder:  right  160 degrees forward flexion; 145 degrees abduction; 30 degrees internal rotation, 30 degrees external rotation, 10 degrees extension, 40 degrees adduction.   Stability:   Overall:  Shoulder, elbow and wrist stable   Strength and Tone:   Overall full shoulder muscles strength, full upper arm strength and normal upper arm bulk and tone. All other systems reviewed and are negative   The patient has been educated about the nature of the problem(s) and counseled on treatment options.  The patient appeared to understand what I have discussed and is in agreement with it.  Encounter Diagnoses  Name Primary?  . Chronic right-sided low back pain with right-sided sciatica Yes  . Chronic pain of both shoulders   . Essential hypertension   . Arthralgia of multiple joints     PLAN Call if any problems.  Precautions discussed.  Continue current medications.   Return to clinic 3 months   I have reviewed the [place] web site prior to prescribing narcotic medicine for this  patient.  Electronically Signed Sanjuana Kava, MD 5/15/20198:43 AM

## 2017-10-06 ENCOUNTER — Telehealth: Payer: Self-pay | Admitting: Orthopaedic Surgery

## 2017-10-06 MED ORDER — HYDROCODONE-ACETAMINOPHEN 7.5-325 MG PO TABS: 1.0000 | ORAL_TABLET | Freq: Four times a day (QID) | ORAL | 0 refills | Status: DC | PRN

## 2017-10-06 NOTE — Telephone Encounter (Signed)
Hydrocodone-Acetaminophen  7.5/325mg  Qty 60 Tablets  PATIENT USES [name] 

## 2017-11-18 ENCOUNTER — Telehealth: Payer: Self-pay | Admitting: Orthopaedic Surgery

## 2017-11-18 NOTE — Telephone Encounter (Signed)
Patient requests refill on Hydrocodone/Acetaminophen 7.5-325  Mgs.  Qty  60 °  °  °Sig: Take 1 tablet by mouth every 6 (six) hours as needed for moderate pain. 30 days °  °Patient states she uses [facility] °

## 2017-11-22 MED ORDER — HYDROCODONE-ACETAMINOPHEN 7.5-325 MG PO TABS: 1.0000 | ORAL_TABLET | Freq: Four times a day (QID) | ORAL | 0 refills | Status: DC | PRN

## 2017-12-02 ENCOUNTER — Ambulatory Visit (INDEPENDENT_AMBULATORY_CARE_PROVIDER_SITE_OTHER): Payer: Medicare (Managed Care)

## 2017-12-02 ENCOUNTER — Ambulatory Visit: Payer: Medicare (Managed Care) | Admitting: Orthopaedic Surgery

## 2017-12-02 ENCOUNTER — Encounter: Payer: Self-pay | Admitting: Orthopaedic Surgery

## 2017-12-02 VITALS — BP 146/64 | HR 66 | Ht 65.0 in | Wt 211.0 lb

## 2017-12-02 DIAGNOSIS — M25551 Pain in right hip: Secondary | ICD-10-CM

## 2017-12-02 DIAGNOSIS — M545 Low back pain, unspecified: Secondary | ICD-10-CM

## 2017-12-02 DIAGNOSIS — G8911 Acute pain due to trauma: Secondary | ICD-10-CM

## 2017-12-02 NOTE — Progress Notes (Signed)
Patient Linda Richardson, female DOB:Dec 07, 1923, 84 y.o. [id number]  Chief Complaint  Patient presents with  . Follow-up    New R hip pain wants x-rays fall 3 weeks ago.    HPI  Linda Richardson is a 84 y.o. female who fell at church and landed on her "butt" on 11-14-17.  She said she was a bit "shook up" but got up and attended church.  She felt a little sore that night and kept going.  She has pain of the right hip area with some paresthesias to the right knee at times.  She says she does not hurt but remains sore.  She wanted to check it out.  She has no limp.  She has taken no medicine.  She is very active.   Body mass index is 35.11 kg/m.  ROS  Review of Systems  Constitutional:       Patient has Diabetes Mellitus. Patient has hypertension. Patient does not have COPD or shortness of breath. Patient has BMI > 35. Patient does not have current smoking history.  HENT: Negative for congestion.   Respiratory: Negative for cough and shortness of breath.   Cardiovascular: Negative for chest pain and leg swelling.  Endocrine: Positive for cold intolerance.  Musculoskeletal: Positive for arthralgias, back pain and myalgias.  Allergic/Immunologic: Negative for environmental allergies.  Psychiatric/Behavioral: The patient is nervous/anxious.   All other systems reviewed and are negative.   All other systems reviewed and are negative.  Past Medical History:  Diagnosis Date  . Anxiety   . Bladder cancer ([place])   . Diabetes ([place])   . Dysuria-frequency syndrome   . Glaucoma    2006  . Hyperlipidemia   . Hypertension   . Hypothyroidism   . Obesity     Past Surgical History:  Procedure Laterality Date  . ABDOMINAL HYSTERECTOMY    . Bilateral cataract Surgery     2004  . DILATION AND CURETTAGE OF UTERUS     x3  . Surgery on bladder for cancer    . VESICOVAGINAL FISTULA CLOSURE W/ TAH      Family History  Problem Relation Age of Onset  . Stroke Mother 63  .  Diabetes Sister   . Hypertension Sister     Social History Social History   Tobacco Use  . Smoking status: Former Research scientist (life sciences)  . Smokeless tobacco: Never Used  Substance Use Topics  . Alcohol use: No  . Drug use: No    Allergies  Allergen Reactions  . Avapro [Irbesartan]     Dry cough  . Penicillins   . Sulfonamide Derivatives     Current Outpatient Medications  Medication Sig Dispense Refill  . acyclovir (ZOVIRAX) 400 MG tablet TAKE 1 TABLET BY MOUTH 3 TIMES DAILY. 21 tablet 0  . amLODipine (NORVASC) 5 MG tablet Take 1 tablet (5 mg total) by mouth daily. 30 tablet 0  . aspirin 81 MG tablet Take 81 mg by mouth daily.    . brimonidine (ALPHAGAN P) 0.1 % SOLN Place 1 drop into both eyes 2 (two) times daily. One drop each eye twice daily as needed     . calcium-vitamin D (OSCAL 500/200 D-3) 500-200 MG-UNIT per tablet Take 1 tablet by mouth 2 (two) times daily. 180 tablet 3  . citalopram (CELEXA) 20 MG tablet TAKE (1) TABLET BY MOUTH ONCE DAILY. 30 tablet 0  . cyclobenzaprine (FLEXERIL) 10 MG tablet Take 1 tablet (10 mg total) by mouth as directed. 60 tablet 2  .  furosemide (LASIX) 40 MG tablet     . gabapentin (NEURONTIN) 300 MG capsule     . HYDROcodone-acetaminophen (NORCO) 7.5-325 MG tablet Take 1 tablet by mouth every 6 (six) hours as needed for moderate pain. 30 days 60 tablet 0  . ibuprofen (ADVIL,MOTRIN) 600 MG tablet Take 1 tablet (600 mg total) by mouth every 8 (eight) hours as needed. 100 tablet 5  . levothyroxine (SYNTHROID, LEVOTHROID) 25 MCG tablet TAKE (1) TABLET BY MOUTH ONCE DAILY. 30 tablet 0  . levothyroxine (SYNTHROID, LEVOTHROID) 50 MCG tablet     . loteprednol (LOTEMAX) 0.5 % ophthalmic suspension Place 1 drop into the left eye 4 (four) times daily.    . metoprolol succinate (TOPROL-XL) 50 MG 24 hr tablet TAKE 1 TABLET ONCE A DAY AFTER A MEAL. 30 tablet 0  . Multiple Vitamin (MULTIVITAMIN) tablet Take 1 tablet by mouth daily.    Marland Kitchen omeprazole (PRILOSEC) 20 MG  capsule     . potassium chloride (MICRO-K) 10 MEQ CR capsule     . pravastatin (PRAVACHOL) 20 MG tablet Take 1 tablet (20 mg total) by mouth daily. 30 tablet 0  . spironolactone (ALDACTONE) 50 MG tablet TAKE (1) TABLET TWICE DAILY. 60 tablet 0  . valACYclovir (VALTREX) 1000 MG tablet     . vitamin B-12 (CYANOCOBALAMIN) 1000 MCG tablet Take 1,000 mcg by mouth daily.     No current facility-administered medications for this visit.      Physical Exam  Blood pressure (!) 146/64, pulse 66, height 5\' 5"  (1.651 m), weight 211 lb (95.7 kg).  Constitutional: overall normal hygiene, normal nutrition, well developed, normal grooming, normal body habitus. Assistive device:none  Musculoskeletal: gait and station Limp none, muscle tone and strength are normal, no tremors or atrophy is present.  .  Neurological: coordination overall normal.  Deep tendon reflex/nerve stretch intact.  Sensation normal.  Cranial nerves II-XII intact.   Skin:   Normal overall no scars, lesions, ulcers or rashes. No psoriasis.  Psychiatric: Alert and oriented x 3.  Recent memory intact, remote memory unclear.  Normal mood and affect. Well groomed.  Good eye contact.  Cardiovascular: overall no swelling, no varicosities, no edema bilaterally, normal temperatures of the legs and arms, no clubbing, cyanosis and good capillary refill.  Lymphatic: palpation is normal.  Spine/Pelvis examination:  Inspection:  Overall, sacoiliac joint benign and hips nontender; without crepitus or defects.   Thoracic spine inspection: Alignment normal without kyphosis present   Lumbar spine inspection:  Alignment  with normal lumbar lordosis, without scoliosis apparent.   Thoracic spine palpation:  without tenderness of spinal processes   Lumbar spine palpation: without tenderness of lumbar area; without tightness of lumbar muscles    Range of Motion:   Lumbar flexion, forward flexion is normal without pain or tenderness    Lumbar  extension is full without pain or tenderness   Left lateral bend is normal without pain or tenderness   Right lateral bend is normal without pain or tenderness   Straight leg raising is normal  Strength & tone: normal   Stability overall normal stability All other systems reviewed and are negative   The patient has been educated about the nature of the problem(s) and counseled on treatment options.  The patient appeared to understand what I have discussed and is in agreement with it.  Encounter Diagnosis  Name Primary?  . Pain of right hip joint Yes   X-rays were done of the pelvis and lumbar spine, reported  separately.  PLAN Call if any problems.  Precautions discussed.  Continue current medications.   Return to clinic after bone scan   I will get a bone scan to rule out occult fracture of the sacral/coccyx area.  Electronically Signed Sanjuana Kava, MD 8/15/20199:23 AM

## 2017-12-14 ENCOUNTER — Encounter: Payer: Self-pay | Admitting: Orthopaedic Surgery

## 2017-12-16 ENCOUNTER — Ambulatory Visit: Payer: Medicare (Managed Care) | Admitting: Orthopaedic Surgery

## 2017-12-16 ENCOUNTER — Encounter: Payer: Self-pay | Admitting: Orthopaedic Surgery

## 2017-12-16 VITALS — BP 132/84 | HR 64 | Ht 65.0 in | Wt 210.0 lb

## 2017-12-16 DIAGNOSIS — M25551 Pain in right hip: Secondary | ICD-10-CM | POA: Diagnosis not present

## 2017-12-16 DIAGNOSIS — G8911 Acute pain due to trauma: Secondary | ICD-10-CM | POA: Diagnosis not present

## 2017-12-16 DIAGNOSIS — M545 Low back pain, unspecified: Secondary | ICD-10-CM

## 2017-12-16 NOTE — Progress Notes (Signed)
Patient PI:Linda Richardson Linda Richardson, female DOB:Jul 18, 1923, 84 y.o. YSA:[id number]  Chief Complaint  Patient presents with  . Back Pain    Back pain, bone scan results    HPI  Linda Richardson is a 84 y.o. female who has had lower back pain after a fall and also right hip pain.  I did not see a fracture on the plain x-rays but was concerned about a possible occult fracture.  She is amazingly agile and active for a woman her age.  She had a bone scan which showed only slight DJD changes around L5-S1 and no occult fracture was noted.  I explained the findings to her.  She has much less pain today, she still has some pain but she says she can tolerate it fine.  She has no new trauma.   Body mass index is 34.95 kg/m.  ROS  Review of Systems  Constitutional:       Patient has Diabetes Mellitus. Patient has hypertension. Patient does not have COPD or shortness of breath. Patient has BMI > 35. Patient does not have current smoking history.  HENT: Negative for congestion.   Respiratory: Negative for cough and shortness of breath.   Cardiovascular: Negative for chest pain and leg swelling.  Endocrine: Positive for cold intolerance.  Musculoskeletal: Positive for arthralgias, back pain and myalgias.  Allergic/Immunologic: Negative for environmental allergies.  Psychiatric/Behavioral: The patient is nervous/anxious.   All other systems reviewed and are negative.   All other systems reviewed and are negative.  The following is a summary of the past history medically, past history surgically, known current medicines, social history and family history.  This information is gathered electronically by the computer from prior information and documentation.  I review this each visit and have found including this information at this point in the chart is beneficial and informative.    Past Medical History:  Diagnosis Date  . Anxiety   . Bladder cancer ([place])   . Diabetes ([place])   . Dysuria-frequency  syndrome   . Glaucoma    2006  . Hyperlipidemia   . Hypertension   . Hypothyroidism   . Obesity     Past Surgical History:  Procedure Laterality Date  . ABDOMINAL HYSTERECTOMY    . Bilateral cataract Surgery     2004  . DILATION AND CURETTAGE OF UTERUS     x3  . Surgery on bladder for cancer    . VESICOVAGINAL FISTULA CLOSURE W/ TAH      Family History  Problem Relation Age of Onset  . Stroke Mother 19  . Diabetes Sister   . Hypertension Sister     Social History Social History   Tobacco Use  . Smoking status: Former Research scientist (life sciences)  . Smokeless tobacco: Never Used  Substance Use Topics  . Alcohol use: No  . Drug use: No    Allergies  Allergen Reactions  . Avapro [Irbesartan]     Dry cough  . Penicillins   . Sulfonamide Derivatives     Current Outpatient Medications  Medication Sig Dispense Refill  . acyclovir (ZOVIRAX) 400 MG tablet TAKE 1 TABLET BY MOUTH 3 TIMES DAILY. 21 tablet 0  . amLODipine (NORVASC) 5 MG tablet Take 1 tablet (5 mg total) by mouth daily. 30 tablet 0  . aspirin 81 MG tablet Take 81 mg by mouth daily.    . brimonidine (ALPHAGAN P) 0.1 % SOLN Place 1 drop into both eyes 2 (two) times daily. One drop each eye twice  daily as needed     . calcium-vitamin D (OSCAL 500/200 D-3) 500-200 MG-UNIT per tablet Take 1 tablet by mouth 2 (two) times daily. 180 tablet 3  . citalopram (CELEXA) 20 MG tablet TAKE (1) TABLET BY MOUTH ONCE DAILY. 30 tablet 0  . cyclobenzaprine (FLEXERIL) 10 MG tablet Take 1 tablet (10 mg total) by mouth as directed. 60 tablet 2  . furosemide (LASIX) 40 MG tablet     . gabapentin (NEURONTIN) 300 MG capsule     . HYDROcodone-acetaminophen (NORCO) 7.5-325 MG tablet Take 1 tablet by mouth every 6 (six) hours as needed for moderate pain. 30 days 60 tablet 0  . ibuprofen (ADVIL,MOTRIN) 600 MG tablet Take 1 tablet (600 mg total) by mouth every 8 (eight) hours as needed. 100 tablet 5  . levothyroxine (SYNTHROID, LEVOTHROID) 25 MCG tablet  TAKE (1) TABLET BY MOUTH ONCE DAILY. 30 tablet 0  . levothyroxine (SYNTHROID, LEVOTHROID) 50 MCG tablet     . loteprednol (LOTEMAX) 0.5 % ophthalmic suspension Place 1 drop into the left eye 4 (four) times daily.    . metoprolol succinate (TOPROL-XL) 50 MG 24 hr tablet TAKE 1 TABLET ONCE A DAY AFTER A MEAL. 30 tablet 0  . Multiple Vitamin (MULTIVITAMIN) tablet Take 1 tablet by mouth daily.    Marland Kitchen omeprazole (PRILOSEC) 20 MG capsule     . potassium chloride (MICRO-K) 10 MEQ CR capsule     . pravastatin (PRAVACHOL) 20 MG tablet Take 1 tablet (20 mg total) by mouth daily. 30 tablet 0  . spironolactone (ALDACTONE) 50 MG tablet TAKE (1) TABLET TWICE DAILY. 60 tablet 0  . valACYclovir (VALTREX) 1000 MG tablet     . vitamin B-12 (CYANOCOBALAMIN) 1000 MCG tablet Take 1,000 mcg by mouth daily.     No current facility-administered medications for this visit.      Physical Exam  Blood pressure 132/84, pulse 64, height 5\' 5"  (1.651 m), weight 210 lb (95.3 kg).  Constitutional: overall normal hygiene, normal nutrition, well developed, normal grooming, normal body habitus. Assistive device:none  Musculoskeletal: gait and station Limp none, muscle tone and strength are normal, no tremors or atrophy is present.  .  Neurological: coordination overall normal.  Deep tendon reflex/nerve stretch intact.  Sensation normal.  Cranial nerves II-XII intact.   Skin:   Normal overall no scars, lesions, ulcers or rashes. No psoriasis.  Psychiatric: Alert and oriented x 3.  Recent memory intact, remote memory unclear.  Normal mood and affect. Well groomed.  Good eye contact.  Cardiovascular: overall no swelling, no varicosities, no edema bilaterally, normal temperatures of the legs and arms, no clubbing, cyanosis and good capillary refill.  Lymphatic: palpation is normal.  Spine/Pelvis examination:  Inspection:  Overall, sacoiliac joint benign and hips nontender; without crepitus or defects.   Thoracic spine  inspection: Alignment normal without kyphosis present   Lumbar spine inspection:  Alignment  with normal lumbar lordosis, without scoliosis apparent.   Thoracic spine palpation:  without tenderness of spinal processes   Lumbar spine palpation: without tenderness of lumbar area; without tightness of lumbar muscles    Range of Motion:   Lumbar flexion, forward flexion is normal without pain or tenderness    Lumbar extension is full without pain or tenderness   Left lateral bend is normal without pain or tenderness   Right lateral bend is normal without pain or tenderness   Straight leg raising is normal  Strength & tone: normal   Stability overall normal stability  All other systems reviewed and are negative   The patient has been educated about the nature of the problem(s) and counseled on treatment options.  The patient appeared to understand what I have discussed and is in agreement with it.  Encounter Diagnoses  Name Primary?  . Pain of right hip joint Yes  . Acute low back pain due to trauma     PLAN Call if any problems.  Precautions discussed.  Continue current medications.   Return to clinic 1 month   Electronically Signed Sanjuana Kava, MD 8/29/20199:16 AM

## 2017-12-27 ENCOUNTER — Telehealth: Payer: Self-pay | Admitting: Orthopaedic Surgery

## 2017-12-27 NOTE — Telephone Encounter (Signed)
Hydrocodone-Acetaminophen 7.5/325mg  Qty 60 Tablets ° °PATIENT USES Linda Richardson °

## 2017-12-28 MED ORDER — HYDROCODONE-ACETAMINOPHEN 7.5-325 MG PO TABS: 1.0000 | ORAL_TABLET | Freq: Four times a day (QID) | ORAL | 0 refills | Status: DC | PRN

## 2018-01-18 ENCOUNTER — Ambulatory Visit: Payer: Medicare (Managed Care) | Admitting: Orthopaedic Surgery

## 2018-01-18 ENCOUNTER — Encounter: Payer: Self-pay | Admitting: Orthopaedic Surgery

## 2018-01-18 VITALS — BP 147/69 | HR 67 | Ht 65.0 in | Wt 208.0 lb

## 2018-01-18 DIAGNOSIS — M25551 Pain in right hip: Secondary | ICD-10-CM | POA: Diagnosis not present

## 2018-01-18 NOTE — Progress Notes (Signed)
Patient Linda Richardson, female DOB:1923-10-19, 84 y.o. DXI:[id number]  Chief Complaint  Patient presents with  . Back Pain    HPI  Linda Richardson is a 84 y.o. female who has lower back pain and right hip pain.  She is improved today.  She has less pain, no paresthesias.  She is active. She is taking her medicine.  She has no new trauma or falls.   Body mass index is 34.61 kg/m.  ROS  Review of Systems  Constitutional:       Patient has Diabetes Mellitus. Patient has hypertension. Patient does not have COPD or shortness of breath. Patient has BMI > 35. Patient does not have current smoking history.  HENT: Negative for congestion.   Respiratory: Negative for cough and shortness of breath.   Cardiovascular: Negative for chest pain and leg swelling.  Endocrine: Positive for cold intolerance.  Musculoskeletal: Positive for arthralgias, back pain and myalgias.  Allergic/Immunologic: Negative for environmental allergies.  Psychiatric/Behavioral: The patient is nervous/anxious.   All other systems reviewed and are negative.   All other systems reviewed and are negative.  The following is a summary of the past history medically, past history surgically, known current medicines, social history and family history.  This information is gathered electronically by the computer from prior information and documentation.  I review this each visit and have found including this information at this point in the chart is beneficial and informative.    Past Medical History:  Diagnosis Date  . Anxiety   . Bladder cancer ([place])   . Diabetes ([place])   . Dysuria-frequency syndrome   . Glaucoma    2006  . Hyperlipidemia   . Hypertension   . Hypothyroidism   . Obesity     Past Surgical History:  Procedure Laterality Date  . ABDOMINAL HYSTERECTOMY    . Bilateral cataract Surgery     2004  . DILATION AND CURETTAGE OF UTERUS     x3  . Surgery on bladder for cancer    . VESICOVAGINAL  FISTULA CLOSURE W/ TAH      Family History  Problem Relation Age of Onset  . Stroke Mother 54  . Diabetes Sister   . Hypertension Sister     Social History Social History   Tobacco Use  . Smoking status: Former Research scientist (life sciences)  . Smokeless tobacco: Never Used  Substance Use Topics  . Alcohol use: No  . Drug use: No    Allergies  Allergen Reactions  . Avapro [Irbesartan]     Dry cough  . Penicillins   . Sulfonamide Derivatives     Current Outpatient Medications  Medication Sig Dispense Refill  . acyclovir (ZOVIRAX) 400 MG tablet TAKE 1 TABLET BY MOUTH 3 TIMES DAILY. 21 tablet 0  . amLODipine (NORVASC) 5 MG tablet Take 1 tablet (5 mg total) by mouth daily. 30 tablet 0  . aspirin 81 MG tablet Take 81 mg by mouth daily.    . brimonidine (ALPHAGAN P) 0.1 % SOLN Place 1 drop into both eyes 2 (two) times daily. One drop each eye twice daily as needed     . calcium-vitamin D (OSCAL 500/200 D-3) 500-200 MG-UNIT per tablet Take 1 tablet by mouth 2 (two) times daily. 180 tablet 3  . citalopram (CELEXA) 20 MG tablet TAKE (1) TABLET BY MOUTH ONCE DAILY. 30 tablet 0  . cyclobenzaprine (FLEXERIL) 10 MG tablet Take 1 tablet (10 mg total) by mouth as directed. 60 tablet 2  .  furosemide (LASIX) 40 MG tablet     . gabapentin (NEURONTIN) 300 MG capsule     . HYDROcodone-acetaminophen (NORCO) 7.5-325 MG tablet Take 1 tablet by mouth every 6 (six) hours as needed for moderate pain. 30 days 60 tablet 0  . ibuprofen (ADVIL,MOTRIN) 600 MG tablet Take 1 tablet (600 mg total) by mouth every 8 (eight) hours as needed. 100 tablet 5  . levothyroxine (SYNTHROID, LEVOTHROID) 25 MCG tablet TAKE (1) TABLET BY MOUTH ONCE DAILY. 30 tablet 0  . levothyroxine (SYNTHROID, LEVOTHROID) 50 MCG tablet     . loteprednol (LOTEMAX) 0.5 % ophthalmic suspension Place 1 drop into the left eye 4 (four) times daily.    . metoprolol succinate (TOPROL-XL) 50 MG 24 hr tablet TAKE 1 TABLET ONCE A DAY AFTER A MEAL. 30 tablet 0  .  Multiple Vitamin (MULTIVITAMIN) tablet Take 1 tablet by mouth daily.    Marland Kitchen omeprazole (PRILOSEC) 20 MG capsule     . potassium chloride (MICRO-K) 10 MEQ CR capsule     . pravastatin (PRAVACHOL) 20 MG tablet Take 1 tablet (20 mg total) by mouth daily. 30 tablet 0  . spironolactone (ALDACTONE) 50 MG tablet TAKE (1) TABLET TWICE DAILY. 60 tablet 0  . valACYclovir (VALTREX) 1000 MG tablet     . vitamin B-12 (CYANOCOBALAMIN) 1000 MCG tablet Take 1,000 mcg by mouth daily.     No current facility-administered medications for this visit.      Physical Exam  Blood pressure (!) 147/69, pulse 67, height 5\' 5"  (1.651 m), weight 208 lb (94.3 kg).  Constitutional: overall normal hygiene, normal nutrition, well developed, normal grooming, normal body habitus. Assistive device:none  Musculoskeletal: gait and station Limp none, muscle tone and strength are normal, no tremors or atrophy is present.  .  Neurological: coordination overall normal.  Deep tendon reflex/nerve stretch intact.  Sensation normal.  Cranial nerves II-XII intact.   Skin:   Normal overall no scars, lesions, ulcers or rashes. No psoriasis.  Psychiatric: Alert and oriented x 3.  Recent memory intact, remote memory unclear.  Normal mood and affect. Well groomed.  Good eye contact.  Cardiovascular: overall no swelling, no varicosities, no edema bilaterally, normal temperatures of the legs and arms, no clubbing, cyanosis and good capillary refill.  Lymphatic: palpation is normal.  Spine/Pelvis examination:  Inspection:  Overall, sacoiliac joint benign and hips nontender; without crepitus or defects.   Thoracic spine inspection: Alignment normal without kyphosis present   Lumbar spine inspection:  Alignment  with normal lumbar lordosis, without scoliosis apparent.   Thoracic spine palpation:  without tenderness of spinal processes   Lumbar spine palpation: without tenderness of lumbar area; without tightness of lumbar muscles     Range of Motion:   Lumbar flexion, forward flexion is normal without pain or tenderness    Lumbar extension is full without pain or tenderness   Left lateral bend is normal without pain or tenderness   Right lateral bend is normal without pain or tenderness   Straight leg raising is normal  Strength & tone: normal   Stability overall normal stability  Right hip has full ROM.  NV intact.  All other systems reviewed and are negative   The patient has been educated about the nature of the problem(s) and counseled on treatment options.  The patient appeared to understand what I have discussed and is in agreement with it.  Encounter Diagnosis  Name Primary?  . Pain of right hip joint Yes  PLAN Call if any problems.  Precautions discussed.  Continue current medications.   Return to clinic 6 weeks   Electronically Signed Sanjuana Kava, MD 10/1/20198:44 AM

## 2018-01-25 ENCOUNTER — Telehealth: Payer: Self-pay | Admitting: Orthopaedic Surgery

## 2018-01-25 MED ORDER — HYDROCODONE-ACETAMINOPHEN 7.5-325 MG PO TABS: 1.0000 | ORAL_TABLET | Freq: Four times a day (QID) | ORAL | 0 refills | Status: DC | PRN

## 2018-01-25 NOTE — Telephone Encounter (Signed)
Hydrocodone-Acetaminophen  7.5/325 mg  Qty 60 Tablets  PATIENT USES [facility]  Request faxed in by pharmacy

## 2018-02-23 ENCOUNTER — Telehealth: Payer: Self-pay | Admitting: Orthopaedic Surgery

## 2018-02-23 NOTE — Telephone Encounter (Signed)
Hydrocodone-Acetaminophen  7.5/325mg  Qty 60 Tablets  PATIENT USES [name] 

## 2018-02-24 MED ORDER — HYDROCODONE-ACETAMINOPHEN 7.5-325 MG PO TABS: 1.0000 | ORAL_TABLET | Freq: Four times a day (QID) | ORAL | 0 refills | Status: DC | PRN

## 2018-03-01 ENCOUNTER — Encounter: Payer: Self-pay | Admitting: Orthopaedic Surgery

## 2018-03-01 ENCOUNTER — Ambulatory Visit: Payer: Medicare (Managed Care) | Admitting: Orthopaedic Surgery

## 2018-03-01 VITALS — BP 175/92 | HR 75 | Ht 65.0 in | Wt 210.0 lb

## 2018-03-01 DIAGNOSIS — G8929 Other chronic pain: Secondary | ICD-10-CM

## 2018-03-01 DIAGNOSIS — M5441 Lumbago with sciatica, right side: Secondary | ICD-10-CM

## 2018-03-01 NOTE — Progress Notes (Signed)
Patient Linda Richardson, female DOB:[birth year], 84 y.o. NWG:[id number]  Chief Complaint  Patient presents with  . Hip Pain    right    HPI  Linda Richardson is a 84 y.o. female who has right hip pain and lower back pain chronically.  She is doing well.  She has more pain with the cold weather.  She has no new trauma.  She is taking her medicine and being active.   Body mass index is 34.95 kg/m.  ROS  Review of Systems  Constitutional:       Patient has Diabetes Mellitus. Patient has hypertension. Patient does not have COPD or shortness of breath. Patient has BMI > 35. Patient does not have current smoking history.  HENT: Negative for congestion.   Respiratory: Negative for cough and shortness of breath.   Cardiovascular: Negative for chest pain and leg swelling.  Endocrine: Positive for cold intolerance.  Musculoskeletal: Positive for arthralgias, back pain and myalgias.  Allergic/Immunologic: Negative for environmental allergies.  Psychiatric/Behavioral: The patient is nervous/anxious.   All other systems reviewed and are negative.   All other systems reviewed and are negative.  The following is a summary of the past history medically, past history surgically, known current medicines, social history and family history.  This information is gathered electronically by the computer from prior information and documentation.  I review this each visit and have found including this information at this point in the chart is beneficial and informative.    Past Medical History:  Diagnosis Date  . Anxiety   . Bladder cancer ([place])   . Diabetes ([place])   . Dysuria-frequency syndrome   . Glaucoma    2006  . Hyperlipidemia   . Hypertension   . Hypothyroidism   . Obesity     Past Surgical History:  Procedure Laterality Date  . ABDOMINAL HYSTERECTOMY    . Bilateral cataract Surgery     2004  . DILATION AND CURETTAGE OF UTERUS     x3  . Surgery on bladder for cancer    .  VESICOVAGINAL FISTULA CLOSURE W/ TAH      Family History  Problem Relation Age of Onset  . Stroke Mother 58  . Diabetes Sister   . Hypertension Sister     Social History Social History   Tobacco Use  . Smoking status: Former Research scientist (life sciences)  . Smokeless tobacco: Never Used  Substance Use Topics  . Alcohol use: No  . Drug use: No    Allergies  Allergen Reactions  . Avapro [Irbesartan]     Dry cough  . Penicillins   . Sulfonamide Derivatives     Current Outpatient Medications  Medication Sig Dispense Refill  . acyclovir (ZOVIRAX) 400 MG tablet TAKE 1 TABLET BY MOUTH 3 TIMES DAILY. 21 tablet 0  . amLODipine (NORVASC) 5 MG tablet Take 1 tablet (5 mg total) by mouth daily. 30 tablet 0  . aspirin 81 MG tablet Take 81 mg by mouth daily.    . brimonidine (ALPHAGAN P) 0.1 % SOLN Place 1 drop into both eyes 2 (two) times daily. One drop each eye twice daily as needed     . citalopram (CELEXA) 20 MG tablet TAKE (1) TABLET BY MOUTH ONCE DAILY. 30 tablet 0  . cyclobenzaprine (FLEXERIL) 10 MG tablet Take 1 tablet (10 mg total) by mouth as directed. 60 tablet 2  . furosemide (LASIX) 40 MG tablet     . gabapentin (NEURONTIN) 300 MG capsule     .  HYDROcodone-acetaminophen (NORCO) 7.5-325 MG tablet Take 1 tablet by mouth every 6 (six) hours as needed for moderate pain. 30 days 60 tablet 0  . ibuprofen (ADVIL,MOTRIN) 600 MG tablet Take 1 tablet (600 mg total) by mouth every 8 (eight) hours as needed. 100 tablet 5  . levothyroxine (SYNTHROID, LEVOTHROID) 25 MCG tablet TAKE (1) TABLET BY MOUTH ONCE DAILY. 30 tablet 0  . levothyroxine (SYNTHROID, LEVOTHROID) 50 MCG tablet     . loteprednol (LOTEMAX) 0.5 % ophthalmic suspension Place 1 drop into the left eye 4 (four) times daily.    . metoprolol succinate (TOPROL-XL) 50 MG 24 hr tablet TAKE 1 TABLET ONCE A DAY AFTER A MEAL. 30 tablet 0  . Multiple Vitamin (MULTIVITAMIN) tablet Take 1 tablet by mouth daily.    Marland Kitchen omeprazole (PRILOSEC) 20 MG capsule      . potassium chloride (MICRO-K) 10 MEQ CR capsule     . pravastatin (PRAVACHOL) 20 MG tablet Take 1 tablet (20 mg total) by mouth daily. 30 tablet 0  . spironolactone (ALDACTONE) 50 MG tablet TAKE (1) TABLET TWICE DAILY. 60 tablet 0  . valACYclovir (VALTREX) 1000 MG tablet     . vitamin B-12 (CYANOCOBALAMIN) 1000 MCG tablet Take 1,000 mcg by mouth daily.    . calcium-vitamin D (OSCAL 500/200 D-3) 500-200 MG-UNIT per tablet Take 1 tablet by mouth 2 (two) times daily. 180 tablet 3   No current facility-administered medications for this visit.      Physical Exam  Blood pressure (!) 175/92, pulse 75, height 5\' 5"  (1.651 m), weight 210 lb (95.3 kg).  Constitutional: overall normal hygiene, normal nutrition, well developed, normal grooming, normal body habitus. Assistive device:none  Musculoskeletal: gait and station Limp none, muscle tone and strength are normal, no tremors or atrophy is present.  .  Neurological: coordination overall normal.  Deep tendon reflex/nerve stretch intact.  Sensation normal.  Cranial nerves II-XII intact.   Skin:   Normal overall no scars, lesions, ulcers or rashes. No psoriasis.  Psychiatric: Alert and oriented x 3.  Recent memory intact, remote memory unclear.  Normal mood and affect. Well groomed.  Good eye contact.  Cardiovascular: overall no swelling, no varicosities, no edema bilaterally, normal temperatures of the legs and arms, no clubbing, cyanosis and good capillary refill.  Lymphatic: palpation is normal.  Spine/Pelvis examination:  Inspection:  Overall, sacoiliac joint benign and hips nontender; without crepitus or defects.   Thoracic spine inspection: Alignment normal without kyphosis present   Lumbar spine inspection:  Alignment  with normal lumbar lordosis, without scoliosis apparent.   Thoracic spine palpation:  without tenderness of spinal processes   Lumbar spine palpation: without tenderness of lumbar area; without tightness of lumbar  muscles    Range of Motion:   Lumbar flexion, forward flexion is normal without pain or tenderness    Lumbar extension is full without pain or tenderness   Left lateral bend is normal without pain or tenderness   Right lateral bend is normal without pain or tenderness   Straight leg raising is normal  Strength & tone: normal   Stability overall normal stability All other systems reviewed and are negative   The patient has been educated about the nature of the problem(s) and counseled on treatment options.  The patient appeared to understand what I have discussed and is in agreement with it.  Encounter Diagnosis  Name Primary?  . Chronic right-sided low back pain with right-sided sciatica Yes    PLAN Call if  any problems.  Precautions discussed.  Continue current medications.   Return to clinic 2 months   Electronically Signed Sanjuana Kava, MD 11/12/20199:07 AM

## 2018-04-21 ENCOUNTER — Telehealth: Payer: Self-pay | Admitting: Orthopaedic Surgery

## 2018-04-21 MED ORDER — HYDROCODONE-ACETAMINOPHEN 7.5-325 MG PO TABS: 1.0000 | ORAL_TABLET | Freq: Four times a day (QID) | ORAL | 0 refills | Status: DC | PRN

## 2018-04-21 NOTE — Telephone Encounter (Signed)
Patient requests refill on Hydrocodone/Acetaminophen 7.5-325 mgs.  Qty  60  Sig: Take 1 tablet by mouth every 6 (six) hours as needed for moderate pain. 30 days  Patient states [facility] uses [facility] [place]

## 2018-05-03 ENCOUNTER — Encounter: Payer: Self-pay | Admitting: Orthopaedic Surgery

## 2018-05-03 ENCOUNTER — Ambulatory Visit: Payer: Medicare (Managed Care) | Admitting: Orthopaedic Surgery

## 2018-05-03 VITALS — BP 154/68 | HR 74 | Wt 208.0 lb

## 2018-05-03 DIAGNOSIS — M5441 Lumbago with sciatica, right side: Secondary | ICD-10-CM

## 2018-05-03 DIAGNOSIS — G8929 Other chronic pain: Secondary | ICD-10-CM

## 2018-05-03 NOTE — Progress Notes (Signed)
Patient Linda Richardson, female DOB:1923/05/07, 85 y.o. TOI:[id number]  Chief Complaint  Patient presents with  . Back Pain    HPI  Linda Richardson is a 85 y.o. female who has chronic lower back pain.  She is very active for her age.  She has no new trauma.  She has more pain in the mornings and cold rainy days.  She is taking her medicine and doing her exercises.     Body mass index is 34.61 kg/m.  ROS  Review of Systems  Constitutional:       Patient has Diabetes Mellitus. Patient has hypertension. Patient does not have COPD or shortness of breath. Patient has BMI > 35. Patient does not have current smoking history.  HENT: Negative for congestion.   Respiratory: Negative for cough and shortness of breath.   Cardiovascular: Negative for chest pain and leg swelling.  Endocrine: Positive for cold intolerance.  Musculoskeletal: Positive for arthralgias, back pain and myalgias.  Allergic/Immunologic: Negative for environmental allergies.  Psychiatric/Behavioral: The patient is nervous/anxious.   All other systems reviewed and are negative.   All other systems reviewed and are negative.  The following is a summary of the past history medically, past history surgically, known current medicines, social history and family history.  This information is gathered electronically by the computer from prior information and documentation.  I review this each visit and have found including this information at this point in the chart is beneficial and informative.    Past Medical History:  Diagnosis Date  . Anxiety   . Bladder cancer ([place])   . Diabetes ([place])   . Dysuria-frequency syndrome   . Glaucoma    2006  . Hyperlipidemia   . Hypertension   . Hypothyroidism   . Obesity     Past Surgical History:  Procedure Laterality Date  . ABDOMINAL HYSTERECTOMY    . Bilateral cataract Surgery     2004  . DILATION AND CURETTAGE OF UTERUS     x3  . Surgery on bladder for cancer     . VESICOVAGINAL FISTULA CLOSURE W/ TAH      Family History  Problem Relation Age of Onset  . Stroke Mother 55  . Diabetes Sister   . Hypertension Sister     Social History Social History   Tobacco Use  . Smoking status: Former Research scientist (life sciences)  . Smokeless tobacco: Never Used  Substance Use Topics  . Alcohol use: No  . Drug use: No    Allergies  Allergen Reactions  . Avapro [Irbesartan]     Dry cough  . Penicillins   . Sulfonamide Derivatives     Current Outpatient Medications  Medication Sig Dispense Refill  . acyclovir (ZOVIRAX) 400 MG tablet TAKE 1 TABLET BY MOUTH 3 TIMES DAILY. 21 tablet 0  . amLODipine (NORVASC) 5 MG tablet Take 1 tablet (5 mg total) by mouth daily. 30 tablet 0  . aspirin 81 MG tablet Take 81 mg by mouth daily.    . brimonidine (ALPHAGAN P) 0.1 % SOLN Place 1 drop into both eyes 2 (two) times daily. One drop each eye twice daily as needed     . citalopram (CELEXA) 20 MG tablet TAKE (1) TABLET BY MOUTH ONCE DAILY. 30 tablet 0  . cyclobenzaprine (FLEXERIL) 10 MG tablet Take 1 tablet (10 mg total) by mouth as directed. 60 tablet 2  . furosemide (LASIX) 40 MG tablet     . gabapentin (NEURONTIN) 300 MG capsule     .  HYDROcodone-acetaminophen (NORCO) 7.5-325 MG tablet Take 1 tablet by mouth every 6 (six) hours as needed for moderate pain. 30 days 60 tablet 0  . ibuprofen (ADVIL,MOTRIN) 600 MG tablet Take 1 tablet (600 mg total) by mouth every 8 (eight) hours as needed. 100 tablet 5  . levothyroxine (SYNTHROID, LEVOTHROID) 25 MCG tablet TAKE (1) TABLET BY MOUTH ONCE DAILY. 30 tablet 0  . levothyroxine (SYNTHROID, LEVOTHROID) 50 MCG tablet     . loteprednol (LOTEMAX) 0.5 % ophthalmic suspension Place 1 drop into the left eye 4 (four) times daily.    . metoprolol succinate (TOPROL-XL) 50 MG 24 hr tablet TAKE 1 TABLET ONCE A DAY AFTER A MEAL. 30 tablet 0  . Multiple Vitamin (MULTIVITAMIN) tablet Take 1 tablet by mouth daily.    Marland Kitchen omeprazole (PRILOSEC) 20 MG capsule      . potassium chloride (MICRO-K) 10 MEQ CR capsule     . pravastatin (PRAVACHOL) 20 MG tablet Take 1 tablet (20 mg total) by mouth daily. 30 tablet 0  . spironolactone (ALDACTONE) 50 MG tablet TAKE (1) TABLET TWICE DAILY. 60 tablet 0  . valACYclovir (VALTREX) 1000 MG tablet     . vitamin B-12 (CYANOCOBALAMIN) 1000 MCG tablet Take 1,000 mcg by mouth daily.    . calcium-vitamin D (OSCAL 500/200 D-3) 500-200 MG-UNIT per tablet Take 1 tablet by mouth 2 (two) times daily. 180 tablet 3   No current facility-administered medications for this visit.      Physical Exam  Blood pressure (!) 154/68, pulse 74, weight 208 lb (94.3 kg).  Constitutional: overall normal hygiene, normal nutrition, well developed, normal grooming, normal body habitus. Assistive device:none  Musculoskeletal: gait and station Limp none, muscle tone and strength are normal, no tremors or atrophy is present.  .  Neurological: coordination overall normal.  Deep tendon reflex/nerve stretch intact.  Sensation normal.  Cranial nerves II-XII intact.   Skin:   Normal overall no scars, lesions, ulcers or rashes. No psoriasis.  Psychiatric: Alert and oriented x 3.  Recent memory intact, remote memory unclear.  Normal mood and affect. Well groomed.  Good eye contact.  Cardiovascular: overall no swelling, no varicosities, no edema bilaterally, normal temperatures of the legs and arms, no clubbing, cyanosis and good capillary refill.  Lymphatic: palpation is normal.  Spine/Pelvis examination:  Inspection:  Overall, sacoiliac joint benign and hips nontender; without crepitus or defects.   Thoracic spine inspection: Alignment normal without kyphosis present   Lumbar spine inspection:  Alignment  with normal lumbar lordosis, without scoliosis apparent.   Thoracic spine palpation:  without tenderness of spinal processes   Lumbar spine palpation: without tenderness of lumbar area; without tightness of lumbar muscles    Range of  Motion:   Lumbar flexion, forward flexion is normal without pain or tenderness    Lumbar extension is full without pain or tenderness   Left lateral bend is normal without pain or tenderness   Right lateral bend is normal without pain or tenderness   Straight leg raising is normal  Strength & tone: normal   Stability overall normal stability  All other systems reviewed and are negative   The patient has been educated about the nature of the problem(s) and counseled on treatment options.  The patient appeared to understand what I have discussed and is in agreement with it.  Encounter Diagnosis  Name Primary?  . Chronic right-sided low back pain with right-sided sciatica Yes    PLAN Call if any problems.  Precautions  discussed.  Continue current medications.   Return to clinic 2 months   Electronically [place], MD 1/14/20208:27 AM

## 2018-06-07 ENCOUNTER — Telehealth: Payer: Self-pay | Admitting: Orthopaedic Surgery

## 2018-06-07 MED ORDER — HYDROCODONE-ACETAMINOPHEN 7.5-325 MG PO TABS: 1.0000 | ORAL_TABLET | Freq: Four times a day (QID) | ORAL | 0 refills | Status: DC | PRN

## 2018-06-07 NOTE — Telephone Encounter (Signed)
Hydrocodone-Acetaminophen  7.5/325mg  Qty 60 Tablets  PATIENT USES [name] 

## 2018-07-26 ENCOUNTER — Telehealth: Payer: Self-pay | Admitting: Orthopaedic Surgery

## 2018-07-26 NOTE — Telephone Encounter (Signed)
Patient requests refill on Hydrocodone/Acetaminophen 7.5-325  Mgs.  Qty  60  Sig: Take 1 tablet by mouth every 6 (six) hours as needed for moderate pain. 30 days  Patient states she uses [facility] in [place]

## 2018-08-01 MED ORDER — HYDROCODONE-ACETAMINOPHEN 7.5-325 MG PO TABS: 1.0000 | ORAL_TABLET | Freq: Four times a day (QID) | ORAL | 0 refills | Status: DC | PRN

## 2018-08-02 ENCOUNTER — Other Ambulatory Visit: Payer: Self-pay

## 2018-08-02 ENCOUNTER — Encounter: Payer: Self-pay | Admitting: Orthopaedic Surgery

## 2018-08-02 ENCOUNTER — Ambulatory Visit (INDEPENDENT_AMBULATORY_CARE_PROVIDER_SITE_OTHER): Payer: Medicare (Managed Care) | Admitting: Orthopaedic Surgery

## 2018-08-02 DIAGNOSIS — M5441 Lumbago with sciatica, right side: Secondary | ICD-10-CM

## 2018-08-02 DIAGNOSIS — G8929 Other chronic pain: Secondary | ICD-10-CM

## 2018-08-02 NOTE — Progress Notes (Signed)
Virtual Visit via Telephone Note  I connected with Linda Richardson on 08/02/18 at  8:40 AM EDT by telephone and verified that I am speaking with the correct person using two identifiers.   I discussed the limitations, risks, security and privacy concerns of performing an evaluation and management service by telephone and the availability of in person appointments. I also discussed with the patient that there may be a patient responsible charge related to this service. The patient expressed understanding and agreed to proceed.   History of Present Illness: Her lower back pain is stable. She has no new trauma.  She has pain that varies with activity and the weather.  She has been taking her medicine.  She has no weakness.   Observations/Objective: Per above Assessment and Plan: Encounter Diagnosis  Name Primary?  . Chronic right-sided low back pain with right-sided sciatica Yes     Follow Up Instructions: Three months.  Continue medicine.   I discussed the assessment and treatment plan with the patient. The patient was provided an opportunity to ask questions and all were answered. The patient agreed with the plan and demonstrated an understanding of the instructions.   The patient was advised to call back or seek an in-person evaluation if the symptoms worsen or if the condition fails to improve as anticipated.  I provided 5 minutes of non-face-to-face time during this encounter.   Linda Kava, MD

## 2018-09-28 ENCOUNTER — Telehealth: Payer: Self-pay | Admitting: Orthopaedic Surgery

## 2018-09-28 MED ORDER — HYDROCODONE-ACETAMINOPHEN 7.5-325 MG PO TABS: 1.0000 | ORAL_TABLET | Freq: Four times a day (QID) | ORAL | 0 refills | Status: DC | PRN

## 2018-09-28 NOTE — Telephone Encounter (Signed)
Hydrocodone-Acetaminophen 7.5/325mg   Qty 60 Tablets  PATIENT USES [name] IN [place]

## 2018-11-01 ENCOUNTER — Ambulatory Visit: Payer: Self-pay | Admitting: Orthopaedic Surgery

## 2018-11-09 ENCOUNTER — Ambulatory Visit: Payer: No Typology Code available for payment source | Admitting: Orthopaedic Surgery

## 2018-11-09 ENCOUNTER — Other Ambulatory Visit: Payer: Self-pay

## 2018-11-17 ENCOUNTER — Encounter: Payer: Self-pay | Admitting: Orthopaedic Surgery

## 2018-11-17 ENCOUNTER — Ambulatory Visit: Payer: Medicare Other

## 2018-11-17 ENCOUNTER — Other Ambulatory Visit: Payer: Self-pay

## 2018-11-17 ENCOUNTER — Ambulatory Visit (INDEPENDENT_AMBULATORY_CARE_PROVIDER_SITE_OTHER): Payer: No Typology Code available for payment source | Admitting: Orthopaedic Surgery

## 2018-11-17 VITALS — BP 174/61 | HR 74 | Temp 97.3°F | Wt 208.0 lb

## 2018-11-17 DIAGNOSIS — M79671 Pain in right foot: Secondary | ICD-10-CM | POA: Diagnosis not present

## 2018-11-17 DIAGNOSIS — M25571 Pain in right ankle and joints of right foot: Secondary | ICD-10-CM

## 2018-11-17 DIAGNOSIS — G8929 Other chronic pain: Secondary | ICD-10-CM

## 2018-11-17 DIAGNOSIS — M5441 Lumbago with sciatica, right side: Secondary | ICD-10-CM | POA: Diagnosis not present

## 2018-11-17 MED ORDER — HYDROCODONE-ACETAMINOPHEN 7.5-325 MG PO TABS: 1.0000 | ORAL_TABLET | Freq: Four times a day (QID) | ORAL | 0 refills | Status: DC | PRN

## 2018-11-17 MED ORDER — IBUPROFEN 600 MG PO TABS: 600.0000 mg | ORAL_TABLET | Freq: Three times a day (TID) | ORAL | 5 refills | Status: AC | PRN

## 2018-11-17 NOTE — Progress Notes (Signed)
Patient Linda Richardson, female DOB:10/13/1923, 85 y.o. [id number]  Chief Complaint  Patient presents with  . Back Pain    into right hip   . Foot Swelling    hit right foot ankle 2d ago on carton of soda now pain swelling     HPI  Linda Richardson is a 85 y.o. female who has chronic lower back pain.  She has no weakness, no trauma to the back. She has been very active.  She hit a box on the floor with her right foot two days ago and has swelling and pain of the right foot and ankle. She has some ecchymosis.  She has a limp.  She is using a cane.   Body mass index is 34.61 kg/m.  ROS  Review of Systems  Constitutional: Positive for activity change.       Patient has Diabetes Mellitus. Patient has hypertension. Patient does not have COPD or shortness of breath. Patient has BMI > 35. Patient does not have current smoking history.  HENT: Negative for congestion.   Respiratory: Negative for cough and shortness of breath.   Cardiovascular: Negative for chest pain and leg swelling.  Endocrine: Positive for cold intolerance.  Musculoskeletal: Positive for arthralgias, back pain, joint swelling and myalgias.  Allergic/Immunologic: Negative for environmental allergies.  Psychiatric/Behavioral: The patient is nervous/anxious.   All other systems reviewed and are negative.   All other systems reviewed and are negative.  The following is a summary of the past history medically, past history surgically, known current medicines, social history and family history.  This information is gathered electronically by the computer from prior information and documentation.  I review this each visit and have found including this information at this point in the chart is beneficial and informative.    Past Medical History:  Diagnosis Date  . Anxiety   . Bladder cancer ([place])   . Diabetes ([place])   . Dysuria-frequency syndrome   . Glaucoma    2006  . Hyperlipidemia   . Hypertension    . Hypothyroidism   . Obesity     Past Surgical History:  Procedure Laterality Date  . ABDOMINAL HYSTERECTOMY    . Bilateral cataract Surgery     2004  . DILATION AND CURETTAGE OF UTERUS     x3  . Surgery on bladder for cancer    . VESICOVAGINAL FISTULA CLOSURE W/ TAH      Family History  Problem Relation Age of Onset  . Stroke Mother 52  . Diabetes Sister   . Hypertension Sister     Social History Social History   Tobacco Use  . Smoking status: Former Research scientist (life sciences)  . Smokeless tobacco: Never Used  Substance Use Topics  . Alcohol use: No  . Drug use: No    Allergies  Allergen Reactions  . Avapro [Irbesartan]     Dry cough  . Penicillins   . Sulfonamide Derivatives     Current Outpatient Medications  Medication Sig Dispense Refill  . acyclovir (ZOVIRAX) 400 MG tablet TAKE 1 TABLET BY MOUTH 3 TIMES DAILY. 21 tablet 0  . amLODipine (NORVASC) 5 MG tablet Take 1 tablet (5 mg total) by mouth daily. 30 tablet 0  . aspirin 81 MG tablet Take 81 mg by mouth daily.    . brimonidine (ALPHAGAN P) 0.1 % SOLN Place 1 drop into both eyes 2 (two) times daily. One drop each eye twice daily as needed     . citalopram (CELEXA)  20 MG tablet TAKE (1) TABLET BY MOUTH ONCE DAILY. 30 tablet 0  . cyclobenzaprine (FLEXERIL) 10 MG tablet Take 1 tablet (10 mg total) by mouth as directed. 60 tablet 2  . furosemide (LASIX) 40 MG tablet     . gabapentin (NEURONTIN) 300 MG capsule     . HYDROcodone-acetaminophen (NORCO) 7.5-325 MG tablet Take 1 tablet by mouth every 6 (six) hours as needed for moderate pain. 30 days 60 tablet 0  . ibuprofen (ADVIL) 600 MG tablet Take 1 tablet (600 mg total) by mouth every 8 (eight) hours as needed. 100 tablet 5  . levothyroxine (SYNTHROID, LEVOTHROID) 25 MCG tablet TAKE (1) TABLET BY MOUTH ONCE DAILY. 30 tablet 0  . levothyroxine (SYNTHROID, LEVOTHROID) 50 MCG tablet     . loteprednol (LOTEMAX) 0.5 % ophthalmic suspension Place 1 drop into the left eye 4 (four)  times daily.    . metoprolol succinate (TOPROL-XL) 50 MG 24 hr tablet TAKE 1 TABLET ONCE A DAY AFTER A MEAL. 30 tablet 0  . Multiple Vitamin (MULTIVITAMIN) tablet Take 1 tablet by mouth daily.    Marland Kitchen omeprazole (PRILOSEC) 20 MG capsule     . potassium chloride (MICRO-K) 10 MEQ CR capsule     . pravastatin (PRAVACHOL) 20 MG tablet Take 1 tablet (20 mg total) by mouth daily. 30 tablet 0  . spironolactone (ALDACTONE) 50 MG tablet TAKE (1) TABLET TWICE DAILY. 60 tablet 0  . valACYclovir (VALTREX) 1000 MG tablet     . vitamin B-12 (CYANOCOBALAMIN) 1000 MCG tablet Take 1,000 mcg by mouth daily.    . calcium-vitamin D (OSCAL 500/200 D-3) 500-200 MG-UNIT per tablet Take 1 tablet by mouth 2 (two) times daily. 180 tablet 3   No current facility-administered medications for this visit.      Physical Exam  Blood pressure (!) 174/61, pulse 74, temperature (!) 97.3 F (36.3 C), weight 208 lb (94.3 kg).  Constitutional: overall normal hygiene, normal nutrition, well developed, normal grooming, normal body habitus. Assistive device:cane  Musculoskeletal: gait and station Limp right, muscle tone and strength are normal, no tremors or atrophy is present.  .  Neurological: coordination overall normal.  Deep tendon reflex/nerve stretch intact.  Sensation normal.  Cranial nerves II-XII intact.   Skin:   Normal overall no scars, lesions, ulcers or rashes. No psoriasis.  Psychiatric: Alert and oriented x 3.  Recent memory intact, remote memory unclear.  Normal mood and affect. Well groomed.  Good eye contact.  Cardiovascular: overall no swelling, no varicosities, no edema bilaterally, normal temperatures of the legs and arms, no clubbing, cyanosis and good capillary refill.  Lymphatic: palpation is normal. Spine/Pelvis examination:  Inspection:  Overall, sacoiliac joint benign and hips nontender; without crepitus or defects.   Thoracic spine inspection: Alignment normal without kyphosis  present   Lumbar spine inspection:  Alignment  with normal lumbar lordosis, without scoliosis apparent.   Thoracic spine palpation:  without tenderness of spinal processes   Lumbar spine palpation: without tenderness of lumbar area; without tightness of lumbar muscles    Range of Motion:   Lumbar flexion, forward flexion is normal without pain or tenderness    Lumbar extension is full without pain or tenderness   Left lateral bend is normal without pain or tenderness   Right lateral bend is normal without pain or tenderness   Straight leg raising is normal  Strength & tone: normal   Stability overall normal stability  She has swelling laterally of the right ankle  with some ecchymosis.  She has pain with ROM of the right ankle. The right foot is swollen more on the dorsum.  She has a limp right.  NV intact.  X-rays were done of the right foot and ankle, reported separately.  Negative.  All other systems reviewed and are negative   The patient has been educated about the nature of the problem(s) and counseled on treatment options.  The patient appeared to understand what I have discussed and is in agreement with it.  Encounter Diagnoses  Name Primary?  . Acute right ankle pain Yes  . Pain in right foot   . Chronic right-sided low back pain with right-sided sciatica     PLAN Call if any problems.  Precautions discussed.  Continue current medications. Do Contrast Baths, instructions given.  Return to clinic 3 months   I have reviewed the [place] web site prior to prescribing narcotic medicine for this patient.   Electronically Signed Sanjuana Kava, MD 7/30/20208:58 AM

## 2019-02-02 ENCOUNTER — Telehealth: Payer: Self-pay | Admitting: Orthopaedic Surgery

## 2019-02-02 NOTE — Telephone Encounter (Signed)
Patient called this afternoon, 02/02/19, to request refill: HYDROcodone-acetaminophen (NORCO) 7.5-325 MG tablet 60 tablet  -[place]

## 2019-02-06 MED ORDER — HYDROCODONE-ACETAMINOPHEN 7.5-325 MG PO TABS: 1.0000 | ORAL_TABLET | Freq: Four times a day (QID) | ORAL | 0 refills | Status: DC | PRN

## 2019-02-16 ENCOUNTER — Other Ambulatory Visit: Payer: Self-pay

## 2019-02-16 ENCOUNTER — Ambulatory Visit: Payer: No Typology Code available for payment source | Admitting: Orthopaedic Surgery

## 2019-02-16 ENCOUNTER — Encounter: Payer: Self-pay | Admitting: Orthopaedic Surgery

## 2019-02-16 VITALS — Ht 65.0 in | Wt 197.0 lb

## 2019-02-16 DIAGNOSIS — G8929 Other chronic pain: Secondary | ICD-10-CM | POA: Diagnosis not present

## 2019-02-16 DIAGNOSIS — M5441 Lumbago with sciatica, right side: Secondary | ICD-10-CM | POA: Diagnosis not present

## 2019-02-16 NOTE — Progress Notes (Signed)
Patient [id number] Linda Richardson, female DOB:19-Mar-1924, 85 y.o. [id number]  Chief Complaint  Patient presents with  . Back Pain    HPI  Linda Richardson is a 85 y.o. female who has chronic lower back pain with right sided sciatica. She has had more good days recently. She still has some pain but no weakness. She is very active especially for someone her age.  She is doing her exercises and taking her medicine.   Body mass index is 32.78 kg/m.  ROS  Review of Systems  Constitutional: Positive for activity change.       Patient has Diabetes Mellitus. Patient has hypertension. Patient does not have COPD or shortness of breath. Patient has BMI > 35. Patient does not have current smoking history.  HENT: Negative for congestion.   Respiratory: Negative for cough and shortness of breath.   Cardiovascular: Negative for chest pain and leg swelling.  Endocrine: Positive for cold intolerance.  Musculoskeletal: Positive for arthralgias, back pain, joint swelling and myalgias.  Allergic/Immunologic: Negative for environmental allergies.  Psychiatric/Behavioral: The patient is nervous/anxious.   All other systems reviewed and are negative.   All other systems reviewed and are negative.  The following is a summary of the past history medically, past history surgically, known current medicines, social history and family history.  This information is gathered electronically by the computer from prior information and documentation.  I review this each visit and have found including this information at this point in the chart is beneficial and informative.    Past Medical History:  Diagnosis Date  . Anxiety   . Bladder cancer ([place])   . Diabetes ([place])   . Dysuria-frequency syndrome   . Glaucoma    2006  . Hyperlipidemia   . Hypertension   . Hypothyroidism   . Obesity     Past Surgical History:  Procedure Laterality Date  . ABDOMINAL HYSTERECTOMY    . Bilateral cataract Surgery     2004  . DILATION AND CURETTAGE OF UTERUS     x3  . Surgery on bladder for cancer    . VESICOVAGINAL FISTULA CLOSURE W/ TAH      Family History  Problem Relation Age of Onset  . Stroke Mother 25  . Diabetes Sister   . Hypertension Sister     Social History Social History   Tobacco Use  . Smoking status: Former Research scientist (life sciences)  . Smokeless tobacco: Never Used  Substance Use Topics  . Alcohol use: No  . Drug use: No    Allergies  Allergen Reactions  . Avapro [Irbesartan]     Dry cough  . Penicillins   . Sulfonamide Derivatives     Current Outpatient Medications  Medication Sig Dispense Refill  . acyclovir (ZOVIRAX) 400 MG tablet TAKE 1 TABLET BY MOUTH 3 TIMES DAILY. 21 tablet 0  . amLODipine (NORVASC) 5 MG tablet Take 1 tablet (5 mg total) by mouth daily. 30 tablet 0  . aspirin 81 MG tablet Take 81 mg by mouth daily.    . brimonidine (ALPHAGAN P) 0.1 % SOLN Place 1 drop into both eyes 2 (two) times daily. One drop each eye twice daily as needed     . calcium-vitamin D (OSCAL 500/200 D-3) 500-200 MG-UNIT per tablet Take 1 tablet by mouth 2 (two) times daily. 180 tablet 3  . citalopram (CELEXA) 20 MG tablet TAKE (1) TABLET BY MOUTH ONCE DAILY. 30 tablet 0  . cyclobenzaprine (FLEXERIL) 10 MG tablet Take 1 tablet (10  mg total) by mouth as directed. 60 tablet 2  . furosemide (LASIX) 40 MG tablet     . gabapentin (NEURONTIN) 300 MG capsule     . HYDROcodone-acetaminophen (NORCO) 7.5-325 MG tablet Take 1 tablet by mouth every 6 (six) hours as needed for moderate pain. 30 days 60 tablet 0  . ibuprofen (ADVIL) 600 MG tablet Take 1 tablet (600 mg total) by mouth every 8 (eight) hours as needed. 100 tablet 5  . levothyroxine (SYNTHROID, LEVOTHROID) 25 MCG tablet TAKE (1) TABLET BY MOUTH ONCE DAILY. 30 tablet 0  . levothyroxine (SYNTHROID, LEVOTHROID) 50 MCG tablet     . loteprednol (LOTEMAX) 0.5 % ophthalmic suspension Place 1 drop into the left eye 4 (four) times daily.    . metoprolol  succinate (TOPROL-XL) 50 MG 24 hr tablet TAKE 1 TABLET ONCE A DAY AFTER A MEAL. 30 tablet 0  . Multiple Vitamin (MULTIVITAMIN) tablet Take 1 tablet by mouth daily.    Marland Kitchen omeprazole (PRILOSEC) 20 MG capsule     . potassium chloride (MICRO-K) 10 MEQ CR capsule     . pravastatin (PRAVACHOL) 20 MG tablet Take 1 tablet (20 mg total) by mouth daily. 30 tablet 0  . spironolactone (ALDACTONE) 50 MG tablet TAKE (1) TABLET TWICE DAILY. 60 tablet 0  . valACYclovir (VALTREX) 1000 MG tablet     . vitamin B-12 (CYANOCOBALAMIN) 1000 MCG tablet Take 1,000 mcg by mouth daily.     No current facility-administered medications for this visit.      Physical Exam  Height 5\' 5"  (1.651 m), weight 197 lb (89.4 kg).  Constitutional: overall normal hygiene, normal nutrition, well developed, normal grooming, normal body habitus. Assistive device:none  Musculoskeletal: gait and station Limp none, muscle tone and strength are normal, no tremors or atrophy is present.  .  Neurological: coordination overall normal.  Deep tendon reflex/nerve stretch intact.  Sensation normal.  Cranial nerves II-XII intact.   Skin:   Normal overall no scars, lesions, ulcers or rashes. No psoriasis.  Psychiatric: Alert and oriented x 3.  Recent memory intact, remote memory unclear.  Normal mood and affect. Well groomed.  Good eye contact.  Cardiovascular: overall no swelling, no varicosities, no edema bilaterally, normal temperatures of the legs and arms, no clubbing, cyanosis and good capillary refill.  Lymphatic: palpation is normal.  Spine/Pelvis examination:  Inspection:  Overall, sacoiliac joint benign and hips nontender; without crepitus or defects.   Thoracic spine inspection: Alignment normal without kyphosis present   Lumbar spine inspection:  Alignment  with normal lumbar lordosis, without scoliosis apparent.   Thoracic spine palpation:  without tenderness of spinal processes   Lumbar spine palpation: without  tenderness of lumbar area; without tightness of lumbar muscles    Range of Motion:   Lumbar flexion, forward flexion is normal without pain or tenderness    Lumbar extension is full without pain or tenderness   Left lateral bend is normal without pain or tenderness   Right lateral bend is normal without pain or tenderness   Straight leg raising is normal  Strength & tone: normal   Stability overall normal stability  All other systems reviewed and are negative   The patient has been educated about the nature of the problem(s) and counseled on treatment options.  The patient appeared to understand what I have discussed and is in agreement with it.  Encounter Diagnosis  Name Primary?  . Chronic right-sided low back pain with right-sided sciatica Yes  PLAN Call if any problems.  Precautions discussed.  Continue current medications.   Return to clinic 3 months   Electronically Signed Sanjuana Kava, MD 10/29/20208:29 AM

## 2019-03-28 ENCOUNTER — Telehealth: Payer: Self-pay | Admitting: Orthopaedic Surgery

## 2019-03-28 MED ORDER — HYDROCODONE-ACETAMINOPHEN 7.5-325 MG PO TABS: 1.0000 | ORAL_TABLET | Freq: Four times a day (QID) | ORAL | 0 refills | Status: DC | PRN

## 2019-03-28 NOTE — Telephone Encounter (Signed)
Patient requests refill on Hydrocodone/Acetaminophen 7.5-325  Mgs.  Qty  60  Sig: Take 1 tablet by mouth every 6 (six) hours as needed for moderate pain. 30 days  Patient states she uses [facility] in [place]

## 2019-05-18 ENCOUNTER — Ambulatory Visit: Payer: No Typology Code available for payment source | Admitting: Orthopaedic Surgery

## 2019-05-23 ENCOUNTER — Encounter: Payer: Self-pay | Admitting: Orthopaedic Surgery

## 2019-05-23 ENCOUNTER — Ambulatory Visit: Payer: No Typology Code available for payment source | Admitting: Orthopaedic Surgery

## 2019-05-23 ENCOUNTER — Other Ambulatory Visit: Payer: Self-pay

## 2019-05-23 VITALS — BP 113/54 | HR 65 | Temp 96.8°F | Ht 65.0 in | Wt 197.0 lb

## 2019-05-23 DIAGNOSIS — G8929 Other chronic pain: Secondary | ICD-10-CM | POA: Diagnosis not present

## 2019-05-23 DIAGNOSIS — M5441 Lumbago with sciatica, right side: Secondary | ICD-10-CM

## 2019-05-23 DIAGNOSIS — M25511 Pain in right shoulder: Secondary | ICD-10-CM

## 2019-05-23 MED ORDER — HYDROCODONE-ACETAMINOPHEN 7.5-325 MG PO TABS: 1.0000 | ORAL_TABLET | Freq: Four times a day (QID) | ORAL | 0 refills | Status: DC | PRN

## 2019-05-23 NOTE — Progress Notes (Signed)
Patient [id number] Linda Richardson, female DOB:08/16/1923, 86 y.o. [id number]  Chief Complaint  Patient presents with  . Back Pain  . Fall    2 wks ago    HPI  Linda Richardson is a 86 y.o. female who has chronic lower back pain. The cold weather makes it worse.  She has no numbness,no weakness  She is active.  She has fallen and hurt her right shoulder about three to four weeks ago. She has good motion. She says it hurts more at night. She declines x-rays.   Body mass index is 32.78 kg/m.  ROS  Review of Systems  Constitutional: Positive for activity change.       Patient has Diabetes Mellitus. Patient has hypertension. Patient does not have COPD or shortness of breath. Patient has BMI > 35. Patient does not have current smoking history.  HENT: Negative for congestion.   Respiratory: Negative for cough and shortness of breath.   Cardiovascular: Negative for chest pain and leg swelling.  Endocrine: Positive for cold intolerance.  Musculoskeletal: Positive for arthralgias, back pain, joint swelling and myalgias.  Allergic/Immunologic: Negative for environmental allergies.  Psychiatric/Behavioral: The patient is nervous/anxious.   All other systems reviewed and are negative.   All other systems reviewed and are negative.  The following is a summary of the past history medically, past history surgically, known current medicines, social history and family history.  This information is gathered electronically by the computer from prior information and documentation.  I review this each visit and have found including this information at this point in the chart is beneficial and informative.    Past Medical History:  Diagnosis Date  . Anxiety   . Bladder cancer ([place])   . Diabetes ([place])   . Dysuria-frequency syndrome   . Glaucoma    2006  . Hyperlipidemia   . Hypertension   . Hypothyroidism   . Obesity     Past Surgical History:  Procedure Laterality Date  . ABDOMINAL  HYSTERECTOMY    . Bilateral cataract Surgery     2004  . DILATION AND CURETTAGE OF UTERUS     x3  . Surgery on bladder for cancer    . VESICOVAGINAL FISTULA CLOSURE W/ TAH      Family History  Problem Relation Age of Onset  . Stroke Mother 88  . Diabetes Sister   . Hypertension Sister     Social History Social History   Tobacco Use  . Smoking status: Former Research scientist (life sciences)  . Smokeless tobacco: Never Used  Substance Use Topics  . Alcohol use: No  . Drug use: No    Allergies  Allergen Reactions  . Avapro [Irbesartan]     Dry cough  . Penicillins   . Sulfonamide Derivatives     Current Outpatient Medications  Medication Sig Dispense Refill  . acyclovir (ZOVIRAX) 400 MG tablet TAKE 1 TABLET BY MOUTH 3 TIMES DAILY. 21 tablet 0  . amLODipine (NORVASC) 5 MG tablet Take 1 tablet (5 mg total) by mouth daily. 30 tablet 0  . aspirin 81 MG tablet Take 81 mg by mouth daily.    . brimonidine (ALPHAGAN P) 0.1 % SOLN Place 1 drop into both eyes 2 (two) times daily. One drop each eye twice daily as needed     . calcium-vitamin D (OSCAL 500/200 D-3) 500-200 MG-UNIT per tablet Take 1 tablet by mouth 2 (two) times daily. 180 tablet 3  . citalopram (CELEXA) 20 MG tablet TAKE (1) TABLET BY  MOUTH ONCE DAILY. 30 tablet 0  . cyclobenzaprine (FLEXERIL) 10 MG tablet Take 1 tablet (10 mg total) by mouth as directed. 60 tablet 2  . furosemide (LASIX) 40 MG tablet     . gabapentin (NEURONTIN) 300 MG capsule     . HYDROcodone-acetaminophen (NORCO) 7.5-325 MG tablet Take 1 tablet by mouth every 6 (six) hours as needed for moderate pain. 30 days 60 tablet 0  . ibuprofen (ADVIL) 600 MG tablet Take 1 tablet (600 mg total) by mouth every 8 (eight) hours as needed. 100 tablet 5  . levothyroxine (SYNTHROID, LEVOTHROID) 25 MCG tablet TAKE (1) TABLET BY MOUTH ONCE DAILY. 30 tablet 0  . levothyroxine (SYNTHROID, LEVOTHROID) 50 MCG tablet     . loteprednol (LOTEMAX) 0.5 % ophthalmic suspension Place 1 drop into the  left eye 4 (four) times daily.    . metoprolol succinate (TOPROL-XL) 50 MG 24 hr tablet TAKE 1 TABLET ONCE A DAY AFTER A MEAL. 30 tablet 0  . Multiple Vitamin (MULTIVITAMIN) tablet Take 1 tablet by mouth daily.    Marland Kitchen omeprazole (PRILOSEC) 20 MG capsule     . potassium chloride (MICRO-K) 10 MEQ CR capsule     . pravastatin (PRAVACHOL) 20 MG tablet Take 1 tablet (20 mg total) by mouth daily. 30 tablet 0  . spironolactone (ALDACTONE) 50 MG tablet TAKE (1) TABLET TWICE DAILY. 60 tablet 0  . valACYclovir (VALTREX) 1000 MG tablet     . vitamin B-12 (CYANOCOBALAMIN) 1000 MCG tablet Take 1,000 mcg by mouth daily.     No current facility-administered medications for this visit.     Physical Exam  Blood pressure (!) 113/54, pulse 65, temperature (!) 96.8 F (36 C), height 5\' 5"  (1.651 m), weight 197 lb (89.4 kg).  Constitutional: overall normal hygiene, normal nutrition, well developed, normal grooming, normal body habitus. Assistive device:cane  Musculoskeletal: gait and station Limp right, muscle tone and strength are normal, no tremors or atrophy is present.  .  Neurological: coordination overall normal.  Deep tendon reflex/nerve stretch intact.  Sensation normal.  Cranial nerves II-XII intact.   Skin:   Normal overall no scars, lesions, ulcers or rashes. No psoriasis.  Psychiatric: Alert and oriented x 3.  Recent memory intact, remote memory unclear.  Normal mood and affect. Well groomed.  Good eye contact.  Cardiovascular: overall no swelling, no varicosities, no edema bilaterally, normal temperatures of the legs and arms, no clubbing, cyanosis and good capillary refill.  Lymphatic: palpation is normal.  Spine/Pelvis examination:  Inspection:  Overall, sacoiliac joint benign and hips nontender; without crepitus or defects.   Thoracic spine inspection: Alignment normal without kyphosis present   Lumbar spine inspection:  Alignment  with normal lumbar lordosis, without scoliosis  apparent.   Thoracic spine palpation:  without tenderness of spinal processes   Lumbar spine palpation: without tenderness of lumbar area; without tightness of lumbar muscles    Range of Motion:   Lumbar flexion, forward flexion is normal without pain or tenderness    Lumbar extension is full without pain or tenderness   Left lateral bend is normal without pain or tenderness   Right lateral bend is normal without pain or tenderness   Straight leg raising is normal  Strength & tone: normal   Stability overall normal stability Examination of right Upper Extremity is done.  Inspection:   Overall:  Elbow non-tender without crepitus or defects, forearm non-tender without crepitus or defects, wrist non-tender without crepitus or defects, hand non-tender.  Shoulder: with glenohumeral joint tenderness, without effusion.   Upper arm:  without swelling and tenderness   Range of motion:   Overall:  Full range of motion of the elbow, full range of motion of wrist and full range of motion in fingers.   Shoulder:  right  160 degrees forward flexion; 145 degrees abduction; 30 degrees internal rotation, 30 degrees external rotation, 15 degrees extension, 40 degrees adduction.   Stability:   Overall:  Shoulder, elbow and wrist stable   Strength and Tone:   Overall full shoulder muscles strength, full upper arm strength and normal upper arm bulk and tone.  All other systems reviewed and are negative   The patient has been educated about the nature of the problem(s) and counseled on treatment options.  The patient appeared to understand what I have discussed and is in agreement with it.  Encounter Diagnoses  Name Primary?  . Chronic right-sided low back pain with right-sided sciatica Yes  . Acute pain of right shoulder     PLAN Call if any problems.  Precautions discussed.  Continue current medications.   Return to clinic 3 months   I have reviewed the [place] web site prior to prescribing narcotic medicine for this patient.   Electronically Signed Sanjuana Kava, MD 2/2/20219:50 AM

## 2019-06-29 ENCOUNTER — Telehealth: Payer: Self-pay | Admitting: Orthopaedic Surgery

## 2019-06-29 MED ORDER — HYDROCODONE-ACETAMINOPHEN 7.5-325 MG PO TABS: 1.0000 | ORAL_TABLET | Freq: Four times a day (QID) | ORAL | 0 refills | Status: DC | PRN

## 2019-06-29 NOTE — Telephone Encounter (Signed)
Patient requests refill on Hydrocodone/Acetaminophen 7.5-325  Mgs.  Qty  60 °  °  °Sig: Take 1 tablet by mouth every 6 (six) hours as needed for moderate pain. 30 days °  °Patient states she uses [facility] °

## 2019-08-22 ENCOUNTER — Encounter: Payer: Self-pay | Admitting: Orthopaedic Surgery

## 2019-08-22 ENCOUNTER — Ambulatory Visit (INDEPENDENT_AMBULATORY_CARE_PROVIDER_SITE_OTHER): Payer: No Typology Code available for payment source | Admitting: Orthopaedic Surgery

## 2019-08-22 ENCOUNTER — Other Ambulatory Visit: Payer: Self-pay

## 2019-08-22 VITALS — BP 130/72 | HR 76 | Temp 96.6°F | Ht 65.0 in | Wt 197.0 lb

## 2019-08-22 DIAGNOSIS — M5441 Lumbago with sciatica, right side: Secondary | ICD-10-CM | POA: Diagnosis not present

## 2019-08-22 DIAGNOSIS — G8929 Other chronic pain: Secondary | ICD-10-CM

## 2019-08-22 MED ORDER — HYDROCODONE-ACETAMINOPHEN 7.5-325 MG PO TABS: 1.0000 | ORAL_TABLET | Freq: Four times a day (QID) | ORAL | 0 refills | Status: DC | PRN

## 2019-08-22 NOTE — Progress Notes (Signed)
Patient [id number] Linda Richardson, female DOB:01-Jul-1923, 86 y.o. [id number]  Chief Complaint  Patient presents with  . Back Pain    Back pain    HPI  Linda Richardson is a 86 y.o. female who has chronic lower back pain.  She has good and bad days.  She has more right sided pain today.  She has no new trauma, no weakness. She is very active for her age.   Body mass index is 32.78 kg/m.  ROS  Review of Systems  Constitutional: Positive for activity change.       Patient has Diabetes Mellitus. Patient has hypertension. Patient does not have COPD or shortness of breath. Patient has BMI > 35. Patient does not have current smoking history.  HENT: Negative for congestion.   Respiratory: Negative for cough and shortness of breath.   Cardiovascular: Negative for chest pain and leg swelling.  Endocrine: Positive for cold intolerance.  Musculoskeletal: Positive for arthralgias, back pain, joint swelling and myalgias.  Allergic/Immunologic: Negative for environmental allergies.  Psychiatric/Behavioral: The patient is nervous/anxious.   All other systems reviewed and are negative.   All other systems reviewed and are negative.  The following is a summary of the past history medically, past history surgically, known current medicines, social history and family history.  This information is gathered electronically by the computer from prior information and documentation.  I review this each visit and have found including this information at this point in the chart is beneficial and informative.    Past Medical History:  Diagnosis Date  . Anxiety   . Bladder cancer ([place])   . Diabetes ([place])   . Dysuria-frequency syndrome   . Glaucoma    2006  . Hyperlipidemia   . Hypertension   . Hypothyroidism   . Obesity     Past Surgical History:  Procedure Laterality Date  . ABDOMINAL HYSTERECTOMY    . Bilateral cataract Surgery     2004  . DILATION AND CURETTAGE OF UTERUS     x3  .  Surgery on bladder for cancer    . VESICOVAGINAL FISTULA CLOSURE W/ TAH      Family History  Problem Relation Age of Onset  . Stroke Mother 46  . Diabetes Sister   . Hypertension Sister     Social History Social History   Tobacco Use  . Smoking status: Former Research scientist (life sciences)  . Smokeless tobacco: Never Used  Substance Use Topics  . Alcohol use: No  . Drug use: No    Allergies  Allergen Reactions  . Avapro [Irbesartan]     Dry cough  . Penicillins   . Sulfonamide Derivatives     Current Outpatient Medications  Medication Sig Dispense Refill  . acyclovir (ZOVIRAX) 400 MG tablet TAKE 1 TABLET BY MOUTH 3 TIMES DAILY. 21 tablet 0  . amLODipine (NORVASC) 5 MG tablet Take 1 tablet (5 mg total) by mouth daily. 30 tablet 0  . aspirin 81 MG tablet Take 81 mg by mouth daily.    . brimonidine (ALPHAGAN P) 0.1 % SOLN Place 1 drop into both eyes 2 (two) times daily. One drop each eye twice daily as needed     . calcium-vitamin D (OSCAL 500/200 D-3) 500-200 MG-UNIT per tablet Take 1 tablet by mouth 2 (two) times daily. 180 tablet 3  . citalopram (CELEXA) 20 MG tablet TAKE (1) TABLET BY MOUTH ONCE DAILY. 30 tablet 0  . cyclobenzaprine (FLEXERIL) 10 MG tablet Take 1 tablet (10 mg total)  by mouth as directed. 60 tablet 2  . furosemide (LASIX) 40 MG tablet     . gabapentin (NEURONTIN) 300 MG capsule     . HYDROcodone-acetaminophen (NORCO) 7.5-325 MG tablet Take 1 tablet by mouth every 6 (six) hours as needed for moderate pain. 30 days 60 tablet 0  . ibuprofen (ADVIL) 600 MG tablet Take 1 tablet (600 mg total) by mouth every 8 (eight) hours as needed. 100 tablet 5  . levothyroxine (SYNTHROID, LEVOTHROID) 25 MCG tablet TAKE (1) TABLET BY MOUTH ONCE DAILY. 30 tablet 0  . levothyroxine (SYNTHROID, LEVOTHROID) 50 MCG tablet     . loteprednol (LOTEMAX) 0.5 % ophthalmic suspension Place 1 drop into the left eye 4 (four) times daily.    . metoprolol succinate (TOPROL-XL) 50 MG 24 hr tablet TAKE 1 TABLET  ONCE A DAY AFTER A MEAL. 30 tablet 0  . Multiple Vitamin (MULTIVITAMIN) tablet Take 1 tablet by mouth daily.    Marland Kitchen omeprazole (PRILOSEC) 20 MG capsule     . potassium chloride (MICRO-K) 10 MEQ CR capsule     . pravastatin (PRAVACHOL) 20 MG tablet Take 1 tablet (20 mg total) by mouth daily. 30 tablet 0  . spironolactone (ALDACTONE) 50 MG tablet TAKE (1) TABLET TWICE DAILY. 60 tablet 0  . valACYclovir (VALTREX) 1000 MG tablet     . vitamin B-12 (CYANOCOBALAMIN) 1000 MCG tablet Take 1,000 mcg by mouth daily.     No current facility-administered medications for this visit.     Physical Exam  Blood pressure 130/72, pulse 76, temperature (!) 96.6 F (35.9 C), height 5\' 5"  (1.651 m), weight 197 lb (89.4 kg).  Constitutional: overall normal hygiene, normal nutrition, well developed, normal grooming, normal body habitus. Assistive device:cane  Musculoskeletal: gait and station Limp right, muscle tone and strength are normal, no tremors or atrophy is present.  .  Neurological: coordination overall normal.  Deep tendon reflex/nerve stretch intact.  Sensation normal.  Cranial nerves II-XII intact.   Skin:   Normal overall no scars, lesions, ulcers or rashes. No psoriasis.  Psychiatric: Alert and oriented x 3.  Recent memory intact, remote memory unclear.  Normal mood and affect. Well groomed.  Good eye contact.  Cardiovascular: overall no swelling, no varicosities, no edema bilaterally, normal temperatures of the legs and arms, no clubbing, cyanosis and good capillary refill.  Lymphatic: palpation is normal.  Spine/Pelvis examination:  Inspection:  Overall, sacoiliac joint benign and hips nontender; without crepitus or defects.   Thoracic spine inspection: Alignment normal without kyphosis present   Lumbar spine inspection:  Alignment  with normal lumbar lordosis, without scoliosis apparent.   Thoracic spine palpation:  without tenderness of spinal processes   Lumbar spine palpation:  without tenderness of lumbar area; without tightness of lumbar muscles    Range of Motion:   Lumbar flexion, forward flexion is normal without pain or tenderness    Lumbar extension is full without pain or tenderness   Left lateral bend is normal without pain or tenderness   Right lateral bend is normal without pain or tenderness   Straight leg raising is normal  Strength & tone: normal   Stability overall normal stability  All other systems reviewed and are negative   The patient has been educated about the nature of the problem(s) and counseled on treatment options.  The patient appeared to understand what I have discussed and is in agreement with it.  Encounter Diagnosis  Name Primary?  . Chronic right-sided low back  pain with right-sided sciatica Yes    PLAN Call if any problems.  Precautions discussed.  Continue current medications.   Return to clinic 3 months   I have reviewed the [place] web site prior to prescribing narcotic medicine for this patient.   Electronically Signed Sanjuana Kava, MD 5/4/20218:59 AM

## 2019-11-06 ENCOUNTER — Telehealth: Payer: Self-pay | Admitting: Orthopaedic Surgery

## 2019-11-06 MED ORDER — HYDROCODONE-ACETAMINOPHEN 7.5-325 MG PO TABS: 1.0000 | ORAL_TABLET | Freq: Four times a day (QID) | ORAL | 0 refills | Status: DC | PRN

## 2019-11-06 NOTE — Telephone Encounter (Signed)
Patient requests refill on Hydrocodone/Acetaminophen 7.5-325  Mgs.  Qty  60 °  °  °Sig: Take 1 tablet by mouth every 6 (six) hours as needed for moderate pain. 30 days °  °Patient states she uses [facility] °

## 2019-11-21 ENCOUNTER — Ambulatory Visit: Payer: Medicare Other

## 2019-11-21 ENCOUNTER — Other Ambulatory Visit: Payer: Self-pay

## 2019-11-21 ENCOUNTER — Encounter: Payer: Self-pay | Admitting: Orthopaedic Surgery

## 2019-11-21 ENCOUNTER — Ambulatory Visit: Payer: Medicare Other | Admitting: Orthopaedic Surgery

## 2019-11-21 VITALS — BP 141/63 | HR 96 | Ht 65.0 in | Wt 184.0 lb

## 2019-11-21 DIAGNOSIS — M5441 Lumbago with sciatica, right side: Secondary | ICD-10-CM

## 2019-11-21 DIAGNOSIS — G8929 Other chronic pain: Secondary | ICD-10-CM

## 2019-11-21 NOTE — Progress Notes (Signed)
Patient BU:[id number] Linda Richardson, female DOB:1924/03/21, 86 y.o. [id number]  Chief Complaint  Patient presents with  . Back Pain    right sided     HPI  Linda Richardson is a 86 y.o. female who has more pain and paresthesia on the right.  She has no new trauma.  She has no weakness.  Her last MRI was in 2019.  I will get a new MRI.  She may be candidate for epidural.   Body mass index is 30.62 kg/m.  ROS  Review of Systems  Constitutional: Positive for activity change.       Patient has Diabetes Mellitus. Patient has hypertension. Patient does not have COPD or shortness of breath. Patient has BMI > 35. Patient does not have current smoking history.  HENT: Negative for congestion.   Respiratory: Negative for cough and shortness of breath.   Cardiovascular: Negative for chest pain and leg swelling.  Endocrine: Positive for cold intolerance.  Musculoskeletal: Positive for arthralgias, back pain, joint swelling and myalgias.  Allergic/Immunologic: Negative for environmental allergies.  Psychiatric/Behavioral: The patient is nervous/anxious.   All other systems reviewed and are negative.   All other systems reviewed and are negative.  The following is a summary of the past history medically, past history surgically, known current medicines, social history and family history.  This information is gathered electronically by the computer from prior information and documentation.  I review this each visit and have found including this information at this point in the chart is beneficial and informative.    Past Medical History:  Diagnosis Date  . Anxiety   . Bladder cancer ([place])   . Diabetes ([place])   . Dysuria-frequency syndrome   . Glaucoma    2006  . Hyperlipidemia   . Hypertension   . Hypothyroidism   . Obesity     Past Surgical History:  Procedure Laterality Date  . ABDOMINAL HYSTERECTOMY    . Bilateral cataract Surgery     2004  . DILATION AND CURETTAGE OF UTERUS      x3  . Surgery on bladder for cancer    . VESICOVAGINAL FISTULA CLOSURE W/ TAH      Family History  Problem Relation Age of Onset  . Stroke Mother 75  . Diabetes Sister   . Hypertension Sister     Social History Social History   Tobacco Use  . Smoking status: Former Research scientist (life sciences)  . Smokeless tobacco: Never Used  Substance Use Topics  . Alcohol use: No  . Drug use: No    Allergies  Allergen Reactions  . Avapro [Irbesartan]     Dry cough  . Penicillins   . Sulfonamide Derivatives     Current Outpatient Medications  Medication Sig Dispense Refill  . acyclovir (ZOVIRAX) 400 MG tablet TAKE 1 TABLET BY MOUTH 3 TIMES DAILY. 21 tablet 0  . amLODipine (NORVASC) 5 MG tablet Take 1 tablet (5 mg total) by mouth daily. 30 tablet 0  . aspirin 81 MG tablet Take 81 mg by mouth daily.    . brimonidine (ALPHAGAN P) 0.1 % SOLN Place 1 drop into both eyes 2 (two) times daily. One drop each eye twice daily as needed     . citalopram (CELEXA) 20 MG tablet TAKE (1) TABLET BY MOUTH ONCE DAILY. 30 tablet 0  . cyclobenzaprine (FLEXERIL) 10 MG tablet Take 1 tablet (10 mg total) by mouth as directed. 60 tablet 2  . furosemide (LASIX) 40 MG tablet     .  gabapentin (NEURONTIN) 300 MG capsule     . HYDROcodone-acetaminophen (NORCO) 7.5-325 MG tablet Take 1 tablet by mouth every 6 (six) hours as needed for moderate pain. 30 days 60 tablet 0  . ibuprofen (ADVIL) 600 MG tablet Take 1 tablet (600 mg total) by mouth every 8 (eight) hours as needed. 100 tablet 5  . levothyroxine (SYNTHROID, LEVOTHROID) 25 MCG tablet TAKE (1) TABLET BY MOUTH ONCE DAILY. 30 tablet 0  . levothyroxine (SYNTHROID, LEVOTHROID) 50 MCG tablet     . loteprednol (LOTEMAX) 0.5 % ophthalmic suspension Place 1 drop into the left eye 4 (four) times daily.    . metoprolol succinate (TOPROL-XL) 50 MG 24 hr tablet TAKE 1 TABLET ONCE A DAY AFTER A MEAL. 30 tablet 0  . Multiple Vitamin (MULTIVITAMIN) tablet Take 1 tablet by mouth daily.    Marland Kitchen  omeprazole (PRILOSEC) 20 MG capsule     . potassium chloride (MICRO-K) 10 MEQ CR capsule     . pravastatin (PRAVACHOL) 20 MG tablet Take 1 tablet (20 mg total) by mouth daily. 30 tablet 0  . spironolactone (ALDACTONE) 50 MG tablet TAKE (1) TABLET TWICE DAILY. 60 tablet 0  . valACYclovir (VALTREX) 1000 MG tablet     . vitamin B-12 (CYANOCOBALAMIN) 1000 MCG tablet Take 1,000 mcg by mouth daily.    . calcium-vitamin D (OSCAL 500/200 D-3) 500-200 MG-UNIT per tablet Take 1 tablet by mouth 2 (two) times daily. 180 tablet 3  . colchicine 0.6 MG tablet Take 0.6 mg by mouth 2 (two) times daily.    Marland Kitchen lisinopril (ZESTRIL) 20 MG tablet Take 20 mg by mouth daily.     No current facility-administered medications for this visit.     Physical Exam  Blood pressure (!) 141/63, pulse 96, height 5\' 5"  (1.651 m), weight 184 lb (83.5 kg).  Constitutional: overall normal hygiene, normal nutrition, well developed, normal grooming, normal body habitus. Assistive device:cane  Musculoskeletal: gait and station Limp right, muscle tone and strength are normal, no tremors or atrophy is present.  .  Neurological: coordination overall normal.  Deep tendon reflex/nerve stretch intact.  Sensation normal.  Cranial nerves II-XII intact.   Skin:   Normal overall no scars, lesions, ulcers or rashes. No psoriasis.  Psychiatric: Alert and oriented x 3.  Recent memory intact, remote memory unclear.  Normal mood and affect. Well groomed.  Good eye contact.  Cardiovascular: overall no swelling, no varicosities, no edema bilaterally, normal temperatures of the legs and arms, no clubbing, cyanosis and good capillary refill.  Lymphatic: palpation is normal.  Spine/Pelvis examination:  Inspection:  Overall, sacoiliac joint benign and hips nontender; without crepitus or defects.   Thoracic spine inspection: Alignment normal without kyphosis present   Lumbar spine inspection:  Alignment  with normal lumbar lordosis, without  scoliosis apparent.   Thoracic spine palpation:  without tenderness of spinal processes   Lumbar spine palpation: without tenderness of lumbar area; without tightness of lumbar muscles    Range of Motion:   Lumbar flexion, forward flexion is normal without pain or tenderness    Lumbar extension is full without pain or tenderness   Left lateral bend is normal without pain or tenderness   Right lateral bend is normal without pain or tenderness   Straight leg raising is normal  Strength & tone: normal   Stability overall normal stability  All other systems reviewed and are negative   The patient has been educated about the nature of the problem(s) and counseled  on treatment options.  The patient appeared to understand what I have discussed and is in agreement with it.  Encounter Diagnosis  Name Primary?  . Chronic right-sided low back pain with right-sided sciatica Yes    PLAN Call if any problems.  Precautions discussed.  Continue current medications.   Return to clinic 2 weeks   Get MRI.  Electronically Signed Sanjuana Kava, MD 8/3/20219:22 AM

## 2019-12-05 ENCOUNTER — Ambulatory Visit: Payer: No Typology Code available for payment source | Admitting: Orthopaedic Surgery

## 2019-12-06 ENCOUNTER — Telehealth: Payer: Self-pay | Admitting: Orthopaedic Surgery

## 2019-12-06 NOTE — Telephone Encounter (Signed)
Patient and her daughter called this morning regarding scheduling the MRI.  Linda Richardson said she really would like to have this scheduled at [facility]).    Could you take care of this for the patient?  Also, no follow up appointment has been made for this patient.  Thanks

## 2020-01-03 ENCOUNTER — Encounter: Payer: Self-pay | Admitting: Orthopaedic Surgery

## 2020-01-03 ENCOUNTER — Other Ambulatory Visit: Payer: Self-pay

## 2020-01-03 ENCOUNTER — Ambulatory Visit (INDEPENDENT_AMBULATORY_CARE_PROVIDER_SITE_OTHER): Payer: No Typology Code available for payment source | Admitting: Orthopaedic Surgery

## 2020-01-03 VITALS — BP 165/94 | HR 84 | Ht 65.0 in | Wt 184.0 lb

## 2020-01-03 DIAGNOSIS — M5441 Lumbago with sciatica, right side: Secondary | ICD-10-CM | POA: Diagnosis not present

## 2020-01-03 DIAGNOSIS — G8929 Other chronic pain: Secondary | ICD-10-CM

## 2020-01-03 MED ORDER — HYDROCODONE-ACETAMINOPHEN 7.5-325 MG PO TABS: 1.0000 | ORAL_TABLET | Freq: Four times a day (QID) | ORAL | 0 refills | Status: DC | PRN

## 2020-01-03 NOTE — Progress Notes (Signed)
Patient MP:NTIRWER Linda Richardson, female DOB:07/11/1923, 86 y.o. [id number]  Chief Complaint  Patient presents with  . Back Pain  . Medication Refill    HPI  Linda Richardson is a 86 y.o. female who has lower back pain with radiation to the right hip area and thigh.  She had MRI of the lumbar spine which shows: Advanced lumbar disc and facet degeneration with mild spinal stenosis at L3-4 and L4-5; moderate neural foraminal stenosis on the right at L4-5 and Bilaterally at L5-S1, cholelithiasis.  I have reviewed the MRI.  I have explained the findings to her.  I have recommended an epidural by Dr. Ernestina Richardson.  She is agreeable to this.  She is very active person who does not look or act 86 years of age.   Body mass index is 30.62 kg/m.  ROS  Review of Systems  All other systems reviewed and are negative.  The following is a summary of the past history medically, past history surgically, known current medicines, social history and family history.  This information is gathered electronically by the computer from prior information and documentation.  I review this each visit and have found including this information at this point in the chart is beneficial and informative.    Past Medical History:  Diagnosis Date  . Anxiety   . Bladder cancer ([place])   . Diabetes ([place])   . Dysuria-frequency syndrome   . Glaucoma    2006  . Hyperlipidemia   . Hypertension   . Hypothyroidism   . Obesity     Past Surgical History:  Procedure Laterality Date  . ABDOMINAL HYSTERECTOMY    . Bilateral cataract Surgery     2004  . DILATION AND CURETTAGE OF UTERUS     x3  . Surgery on bladder for cancer    . VESICOVAGINAL FISTULA CLOSURE W/ TAH      Family History  Problem Relation Age of Onset  . Stroke Mother 75  . Diabetes Sister   . Hypertension Sister     Social History Social History   Tobacco Use  . Smoking status: Former Research scientist (life sciences)  . Smokeless tobacco: Never Used  Substance Use  Topics  . Alcohol use: No  . Drug use: No    Allergies  Allergen Reactions  . Avapro [Irbesartan]     Dry cough  . Penicillins   . Sulfonamide Derivatives     Current Outpatient Medications  Medication Sig Dispense Refill  . acyclovir (ZOVIRAX) 400 MG tablet TAKE 1 TABLET BY MOUTH 3 TIMES DAILY. 21 tablet 0  . amLODipine (NORVASC) 5 MG tablet Take 1 tablet (5 mg total) by mouth daily. 30 tablet 0  . aspirin 81 MG tablet Take 81 mg by mouth daily.    . brimonidine (ALPHAGAN P) 0.1 % SOLN Place 1 drop into both eyes 2 (two) times daily. One drop each eye twice daily as needed     . calcium-vitamin D (OSCAL 500/200 D-3) 500-200 MG-UNIT per tablet Take 1 tablet by mouth 2 (two) times daily. 180 tablet 3  . citalopram (CELEXA) 20 MG tablet TAKE (1) TABLET BY MOUTH ONCE DAILY. 30 tablet 0  . colchicine 0.6 MG tablet Take 0.6 mg by mouth 2 (two) times daily.    . cyclobenzaprine (FLEXERIL) 10 MG tablet Take 1 tablet (10 mg total) by mouth as directed. 60 tablet 2  . furosemide (LASIX) 40 MG tablet     . gabapentin (NEURONTIN) 300 MG capsule     .  HYDROcodone-acetaminophen (NORCO) 7.5-325 MG tablet Take 1 tablet by mouth every 6 (six) hours as needed for moderate pain. 30 days 60 tablet 0  . ibuprofen (ADVIL) 600 MG tablet Take 1 tablet (600 mg total) by mouth every 8 (eight) hours as needed. 100 tablet 5  . levothyroxine (SYNTHROID, LEVOTHROID) 25 MCG tablet TAKE (1) TABLET BY MOUTH ONCE DAILY. 30 tablet 0  . levothyroxine (SYNTHROID, LEVOTHROID) 50 MCG tablet     . lisinopril (ZESTRIL) 20 MG tablet Take 20 mg by mouth daily.    Marland Kitchen loteprednol (LOTEMAX) 0.5 % ophthalmic suspension Place 1 drop into the left eye 4 (four) times daily.    . metoprolol succinate (TOPROL-XL) 50 MG 24 hr tablet TAKE 1 TABLET ONCE A DAY AFTER A MEAL. 30 tablet 0  . Multiple Vitamin (MULTIVITAMIN) tablet Take 1 tablet by mouth daily.    Marland Kitchen omeprazole (PRILOSEC) 20 MG capsule     . potassium chloride (MICRO-K) 10 MEQ  CR capsule     . pravastatin (PRAVACHOL) 20 MG tablet Take 1 tablet (20 mg total) by mouth daily. 30 tablet 0  . spironolactone (ALDACTONE) 50 MG tablet TAKE (1) TABLET TWICE DAILY. 60 tablet 0  . valACYclovir (VALTREX) 1000 MG tablet     . vitamin B-12 (CYANOCOBALAMIN) 1000 MCG tablet Take 1,000 mcg by mouth daily.     No current facility-administered medications for this visit.     Physical Exam  Blood pressure (!) 165/94, pulse 84, height 5\' 5"  (1.651 m), weight 184 lb (83.5 kg).  Constitutional: overall normal hygiene, normal nutrition, well developed, normal grooming, normal body habitus. Assistive device:cane  Musculoskeletal: gait and station Limp right, muscle tone and strength are normal, no tremors or atrophy is present.  .  Neurological: coordination overall normal.  Deep tendon reflex/nerve stretch intact.  Sensation normal.  Cranial nerves II-XII intact.   Skin:   Normal overall no scars, lesions, ulcers or rashes. No psoriasis.  Psychiatric: Alert and oriented x 3.  Recent memory intact, remote memory unclear.  Normal mood and affect. Well groomed.  Good eye contact.  Cardiovascular: overall no swelling, no varicosities, no edema bilaterally, normal temperatures of the legs and arms, no clubbing, cyanosis and good capillary refill.  Lymphatic: palpation is normal.  Spine/Pelvis examination:  Inspection:  Overall, sacoiliac joint benign and hips nontender; without crepitus or defects.   Thoracic spine inspection: Alignment normal without kyphosis present   Lumbar spine inspection:  Alignment  with normal lumbar lordosis, without scoliosis apparent.   Thoracic spine palpation:  without tenderness of spinal processes   Lumbar spine palpation: without tenderness of lumbar area; without tightness of lumbar muscles    Range of Motion:   Lumbar flexion, forward flexion is normal without pain or tenderness    Lumbar extension is full without pain or tenderness   Left  lateral bend is normal without pain or tenderness   Right lateral bend is normal without pain or tenderness   Straight leg raising is normal  Strength & tone: normal   Stability overall normal stability  All other systems reviewed and are negative   The patient has been educated about the nature of the problem(s) and counseled on treatment options.  The patient appeared to understand what I have discussed and is in agreement with it.  Encounter Diagnosis  Name Primary?  . Chronic right-sided low back pain with right-sided sciatica Yes    PLAN Call if any problems.  Precautions discussed.  Continue current  medications.  See Dr. Ernestina Richardson Return to clinic 6 weeks   I have reviewed the [place] web site prior to prescribing narcotic medicine for this patient.   Electronically Signed Sanjuana Kava, MD 9/15/20219:43 AM

## 2020-01-25 ENCOUNTER — Ambulatory Visit: Payer: No Typology Code available for payment source | Admitting: Physical Medicine and Rehabilitation

## 2020-02-07 ENCOUNTER — Telehealth: Payer: Self-pay | Admitting: Physical Medicine and Rehabilitation

## 2020-02-07 NOTE — Telephone Encounter (Signed)
Just FYI, pt cancelled back injection, see my assistant's phone message. Happy to see her when ready. Josph Macho

## 2020-02-07 NOTE — Telephone Encounter (Signed)
Patient was scheduled for a lumbar ESI on Monday, October 25. She states that she does not want an injection in her back. She wants an injection in her hip. I did try to explain this to her, but she states that she does not want an injection in her back because her husband had one and it paralyzed him. The patient wanted to cancel the appointment, which I have done. She will follow up with Dr. Luna Glasgow and she has an appointment with him on 10/27.

## 2020-02-12 ENCOUNTER — Ambulatory Visit: Payer: No Typology Code available for payment source | Admitting: Physical Medicine and Rehabilitation

## 2020-02-14 ENCOUNTER — Ambulatory Visit (INDEPENDENT_AMBULATORY_CARE_PROVIDER_SITE_OTHER): Payer: No Typology Code available for payment source | Admitting: Orthopaedic Surgery

## 2020-02-14 ENCOUNTER — Other Ambulatory Visit: Payer: Self-pay

## 2020-02-14 ENCOUNTER — Encounter: Payer: Self-pay | Admitting: Orthopaedic Surgery

## 2020-02-14 VITALS — Ht 65.0 in | Wt 184.0 lb

## 2020-02-14 DIAGNOSIS — G8929 Other chronic pain: Secondary | ICD-10-CM

## 2020-02-14 DIAGNOSIS — M5441 Lumbago with sciatica, right side: Secondary | ICD-10-CM

## 2020-02-14 MED ORDER — HYDROCODONE-ACETAMINOPHEN 7.5-325 MG PO TABS: 1.0000 | ORAL_TABLET | Freq: Four times a day (QID) | ORAL | 0 refills | Status: DC | PRN

## 2020-02-14 NOTE — Progress Notes (Signed)
Patient Linda Richardson, female DOB:05-05-1923, 86 y.o. KDX:[id number]  Chief Complaint  Patient presents with  . Back Pain    HPI  Linda Richardson is a 86 y.o. female who has lower back pain.  She saw Dr. Ernestina Patches but she declined the spinal epidural as she said her husband got paralyzed after spinal.  I read Dr. Romona Curls notes.  I had explained to her about the procedure but she did not full realize about the spinal, I guess.    She still has lower back pain.  She says she will take her pain medicine and endure the pain.  Her youngest daughter died recently and she is emotionally upset about this.  Her daughter helped care for her.   Body mass index is 30.62 kg/m.  ROS  Review of Systems  Constitutional: Positive for activity change.       Patient has Diabetes Mellitus. Patient has hypertension. Patient does not have COPD or shortness of breath. Patient has BMI > 35. Patient does not have current smoking history.  HENT: Negative for congestion.   Respiratory: Negative for cough and shortness of breath.   Cardiovascular: Negative for chest pain and leg swelling.  Endocrine: Positive for cold intolerance.  Musculoskeletal: Positive for arthralgias, back pain, joint swelling and myalgias.  Allergic/Immunologic: Negative for environmental allergies.  Psychiatric/Behavioral: The patient is nervous/anxious.   All other systems reviewed and are negative.   All other systems reviewed and are negative.  The following is a summary of the past history medically, past history surgically, known current medicines, social history and family history.  This information is gathered electronically by the computer from prior information and documentation.  I review this each visit and have found including this information at this point in the chart is beneficial and informative.    Past Medical History:  Diagnosis Date  . Anxiety   . Bladder cancer ([place])   . Diabetes ([place])   .  Dysuria-frequency syndrome   . Glaucoma    2006  . Hyperlipidemia   . Hypertension   . Hypothyroidism   . Obesity     Past Surgical History:  Procedure Laterality Date  . ABDOMINAL HYSTERECTOMY    . Bilateral cataract Surgery     2004  . DILATION AND CURETTAGE OF UTERUS     x3  . Surgery on bladder for cancer    . VESICOVAGINAL FISTULA CLOSURE W/ TAH      Family History  Problem Relation Age of Onset  . Stroke Mother 80  . Diabetes Sister   . Hypertension Sister     Social History Social History   Tobacco Use  . Smoking status: Former Research scientist (life sciences)  . Smokeless tobacco: Never Used  Substance Use Topics  . Alcohol use: No  . Drug use: No    Allergies  Allergen Reactions  . Avapro [Irbesartan]     Dry cough  . Penicillins   . Sulfonamide Derivatives     Current Outpatient Medications  Medication Sig Dispense Refill  . acyclovir (ZOVIRAX) 400 MG tablet TAKE 1 TABLET BY MOUTH 3 TIMES DAILY. 21 tablet 0  . amLODipine (NORVASC) 5 MG tablet Take 1 tablet (5 mg total) by mouth daily. 30 tablet 0  . aspirin 81 MG tablet Take 81 mg by mouth daily.    . brimonidine (ALPHAGAN P) 0.1 % SOLN Place 1 drop into both eyes 2 (two) times daily. One drop each eye twice daily as needed     .  citalopram (CELEXA) 20 MG tablet TAKE (1) TABLET BY MOUTH ONCE DAILY. 30 tablet 0  . colchicine 0.6 MG tablet Take 0.6 mg by mouth 2 (two) times daily.    . cyclobenzaprine (FLEXERIL) 10 MG tablet Take 1 tablet (10 mg total) by mouth as directed. 60 tablet 2  . furosemide (LASIX) 40 MG tablet     . gabapentin (NEURONTIN) 300 MG capsule     . HYDROcodone-acetaminophen (NORCO) 7.5-325 MG tablet Take 1 tablet by mouth every 6 (six) hours as needed for moderate pain. 30 days 60 tablet 0  . ibuprofen (ADVIL) 600 MG tablet Take 1 tablet (600 mg total) by mouth every 8 (eight) hours as needed. 100 tablet 5  . levothyroxine (SYNTHROID, LEVOTHROID) 25 MCG tablet TAKE (1) TABLET BY MOUTH ONCE DAILY. 30  tablet 0  . levothyroxine (SYNTHROID, LEVOTHROID) 50 MCG tablet     . lisinopril (ZESTRIL) 20 MG tablet Take 20 mg by mouth daily.    Marland Kitchen loteprednol (LOTEMAX) 0.5 % ophthalmic suspension Place 1 drop into the left eye 4 (four) times daily.    . metoprolol succinate (TOPROL-XL) 50 MG 24 hr tablet TAKE 1 TABLET ONCE A DAY AFTER A MEAL. 30 tablet 0  . Multiple Vitamin (MULTIVITAMIN) tablet Take 1 tablet by mouth daily.    Marland Kitchen omeprazole (PRILOSEC) 20 MG capsule     . potassium chloride (MICRO-K) 10 MEQ CR capsule     . pravastatin (PRAVACHOL) 20 MG tablet Take 1 tablet (20 mg total) by mouth daily. 30 tablet 0  . spironolactone (ALDACTONE) 50 MG tablet TAKE (1) TABLET TWICE DAILY. 60 tablet 0  . valACYclovir (VALTREX) 1000 MG tablet     . vitamin B-12 (CYANOCOBALAMIN) 1000 MCG tablet Take 1,000 mcg by mouth daily.    . calcium-vitamin D (OSCAL 500/200 D-3) 500-200 MG-UNIT per tablet Take 1 tablet by mouth 2 (two) times daily. 180 tablet 3   No current facility-administered medications for this visit.     Physical Exam  Height 5\' 5"  (1.651 m), weight 184 lb (83.5 kg).  Constitutional: overall normal hygiene, normal nutrition, well developed, normal grooming, normal body habitus. Assistive device:none  Musculoskeletal: gait and station Limp none, muscle tone and strength are normal, no tremors or atrophy is present.  .  Neurological: coordination overall normal.  Deep tendon reflex/nerve stretch intact.  Sensation normal.  Cranial nerves II-XII intact.   Skin:   Normal overall no scars, lesions, ulcers or rashes. No psoriasis.  Psychiatric: Alert and oriented x 3.  Recent memory intact, remote memory unclear.  Normal mood and affect. Well groomed.  Good eye contact.  Cardiovascular: overall no swelling, no varicosities, no edema bilaterally, normal temperatures of the legs and arms, no clubbing, cyanosis and good capillary refill.  Lymphatic: palpation is normal.  Spine/Pelvis  examination:  Inspection:  Overall, sacoiliac joint benign and hips nontender; without crepitus or defects.   Thoracic spine inspection: Alignment normal without kyphosis present   Lumbar spine inspection:  Alignment  with normal lumbar lordosis, without scoliosis apparent.   Thoracic spine palpation:  without tenderness of spinal processes   Lumbar spine palpation: without tenderness of lumbar area; without tightness of lumbar muscles    Range of Motion:   Lumbar flexion, forward flexion is normal without pain or tenderness    Lumbar extension is full without pain or tenderness   Left lateral bend is normal without pain or tenderness   Right lateral bend is normal without pain or tenderness  Straight leg raising is normal  Strength & tone: normal   Stability overall normal stability  All other systems reviewed and are negative   The patient has been educated about the nature of the problem(s) and counseled on treatment options.  The patient appeared to understand what I have discussed and is in agreement with it.  Encounter Diagnosis  Name Primary?  . Chronic right-sided low back pain with right-sided sciatica Yes    PLAN Call if any problems.  Precautions discussed.  Continue current medications.   Return to clinic 3 months   I have reviewed the [facility] web site prior to prescribing narcotic medicine for this patient.   Electronically Signed Sanjuana Kava, MD 10/27/202110:04 AM

## 2020-02-15 ENCOUNTER — Ambulatory Visit: Payer: No Typology Code available for payment source | Admitting: Physical Medicine and Rehabilitation

## 2020-04-01 ENCOUNTER — Ambulatory Visit: Payer: No Typology Code available for payment source | Admitting: Physical Medicine and Rehabilitation

## 2020-05-08 ENCOUNTER — Other Ambulatory Visit: Payer: Self-pay

## 2020-05-08 ENCOUNTER — Ambulatory Visit: Payer: Medicare Other | Admitting: Orthopaedic Surgery

## 2020-05-17 ENCOUNTER — Other Ambulatory Visit: Payer: Self-pay

## 2020-05-17 ENCOUNTER — Encounter: Payer: Self-pay | Admitting: Internal Medicine

## 2020-05-17 ENCOUNTER — Ambulatory Visit (INDEPENDENT_AMBULATORY_CARE_PROVIDER_SITE_OTHER): Payer: No Typology Code available for payment source | Admitting: Internal Medicine

## 2020-05-17 VITALS — BP 148/78 | HR 83 | Temp 99.2°F | Resp 18 | Ht 65.5 in | Wt 167.8 lb

## 2020-05-17 DIAGNOSIS — F339 Major depressive disorder, recurrent, unspecified: Secondary | ICD-10-CM

## 2020-05-17 DIAGNOSIS — Z87898 Personal history of other specified conditions: Secondary | ICD-10-CM

## 2020-05-17 DIAGNOSIS — E039 Hypothyroidism, unspecified: Secondary | ICD-10-CM | POA: Diagnosis not present

## 2020-05-17 DIAGNOSIS — G8929 Other chronic pain: Secondary | ICD-10-CM

## 2020-05-17 DIAGNOSIS — E785 Hyperlipidemia, unspecified: Secondary | ICD-10-CM | POA: Diagnosis not present

## 2020-05-17 DIAGNOSIS — M549 Dorsalgia, unspecified: Secondary | ICD-10-CM | POA: Insufficient documentation

## 2020-05-17 DIAGNOSIS — I10 Essential (primary) hypertension: Secondary | ICD-10-CM

## 2020-05-17 DIAGNOSIS — Z7689 Persons encountering health services in other specified circumstances: Secondary | ICD-10-CM | POA: Diagnosis not present

## 2020-05-17 DIAGNOSIS — M545 Low back pain, unspecified: Secondary | ICD-10-CM

## 2020-05-17 DIAGNOSIS — R220 Localized swelling, mass and lump, head: Secondary | ICD-10-CM

## 2020-05-17 NOTE — Assessment & Plan Note (Signed)
BP Readings from Last 1 Encounters:  05/17/20 (!) 148/78   Elevated today If persistently elevated in subsequent visit, will increase Amlodipine dose On Lasix 40 mg QD, mostly for LE swelling Counseled for compliance with the medications Advised DASH diet and moderate exercise/walking as tolerated

## 2020-05-17 NOTE — Assessment & Plan Note (Signed)
Follows up with Orthopedic surgery On Norco PRN Advised to avoid Tylenol while taking Norco

## 2020-05-17 NOTE — Patient Instructions (Addendum)
Please continue taking current medications for now.  Please get CT scan of the head done as scheduled.  Please continue to follow low sodium diet.

## 2020-05-17 NOTE — Assessment & Plan Note (Signed)
On Celexa 

## 2020-05-17 NOTE — Progress Notes (Signed)
New Patient Office Visit  Subjective:  Patient ID: Linda Richardson, female    DOB: 06/17/1923  Age: 87 y.o. MRN: [id number]  CC:  Chief Complaint  Patient presents with  . New Patient (Initial Visit)    New patient is having some scalp soreness for about a year     HPI Linda Richardson is a 87 year old female with PMH of HTN, HLD, hypothyroidism, depression and chronic low back pain who presents for establishing care.  She c/o scalp soreness over left side and shows a bump over the head. She had a fall in 02/2020, and had frontal scalp injury, but she denies any injury in on the left side where she currently has a bump. She mentions having headache which extends to her left ear. She denies any recent vision change. She had been having balance issues chronically due to vertigo, which has resolved now. She has chronic hearing difficulty. She uses a cane for walking support. She is independent for her ADLs. Her neighbor helps her with the transportation.  Her BP was elevated today, but she denies any chest pain, dyspnea or palpitations. She is on Amlodipine 5 mg QD.  She has a h/o chronic low back pain, for which she sees Doctor, general practice. She has been on Norco PRN for her back pain.  She has had 2 doses of COVID vaccine.  Past Medical History:  Diagnosis Date  . Anxiety   . Bladder cancer ([place])   . Diabetes ([place])   . Dysuria-frequency syndrome   . Glaucoma    2006  . Hyperlipidemia   . Hypertension   . Hypothyroidism   . Obesity     Past Surgical History:  Procedure Laterality Date  . ABDOMINAL HYSTERECTOMY    . Bilateral cataract Surgery     2004  . DILATION AND CURETTAGE OF UTERUS     x3  . Surgery on bladder for cancer    . VESICOVAGINAL FISTULA CLOSURE W/ TAH      Family History  Problem Relation Age of Onset  . Stroke Mother 10  . Diabetes Sister   . Hypertension Sister     Social History   Socioeconomic History  . Marital status: Widowed    Spouse  name: Not on file  . Number of children: 3  . Years of education: Not on file  . Highest education level: Not on file  Occupational History  . Not on file  Tobacco Use  . Smoking status: Former Research scientist (life sciences)  . Smokeless tobacco: Never Used  Substance and Sexual Activity  . Alcohol use: No  . Drug use: No  . Sexual activity: Not Currently  Other Topics Concern  . Not on file  Social History Narrative  . Not on file   Social Determinants of Health   Financial Resource Strain: Not on file  Food Insecurity: Not on file  Transportation Needs: Not on file  Physical Activity: Not on file  Stress: Not on file  Social Connections: Not on file  Intimate Partner Violence: Not on file    ROS Review of Systems  Constitutional: Negative for chills and fever.  HENT: Positive for hearing loss. Negative for congestion, sinus pressure, sinus pain and sore throat.   Eyes: Negative for pain and discharge.  Respiratory: Negative for cough and shortness of breath.   Cardiovascular: Negative for chest pain and palpitations.  Gastrointestinal: Negative for abdominal pain, constipation, diarrhea, nausea and vomiting.  Endocrine: Negative for polydipsia and polyuria.  Genitourinary: Negative for dysuria and hematuria.  Musculoskeletal: Positive for arthralgias and back pain. Negative for neck pain and neck stiffness.  Skin: Negative for rash.  Neurological: Positive for headaches. Negative for dizziness and weakness.  Psychiatric/Behavioral: Negative for agitation and behavioral problems.    Objective:   Today's Vitals: BP (!) 148/78 (BP Location: Right Arm, Patient Position: Sitting, Cuff Size: Normal)   Pulse 83   Temp 99.2 F (37.3 C) (Oral)   Resp 18   Ht 5' 5.5" (1.664 m)   Wt 167 lb 12.8 oz (76.1 kg)   SpO2 97%   BMI 27.50 kg/m   Physical Exam Vitals reviewed.  Constitutional:      General: She is not in acute distress.    Appearance: She is not diaphoretic.  HENT:     Head:  Normocephalic.      Nose: Nose normal.     Mouth/Throat:     Mouth: Mucous membranes are moist.  Eyes:     General: No scleral icterus.    Extraocular Movements: Extraocular movements intact.     Pupils: Pupils are equal, round, and reactive to light.  Cardiovascular:     Rate and Rhythm: Normal rate and regular rhythm.     Pulses: Normal pulses.     Heart sounds: Normal heart sounds. No murmur heard.   Pulmonary:     Breath sounds: Normal breath sounds. No wheezing or rales.  Abdominal:     Palpations: Abdomen is soft.     Tenderness: There is no abdominal tenderness.  Musculoskeletal:     Cervical back: Neck supple. No tenderness.     Right lower leg: No edema.     Left lower leg: No edema.  Skin:    General: Skin is warm.     Findings: No rash.  Neurological:     General: No focal deficit present.     Mental Status: She is alert and oriented to person, place, and time.     Sensory: No sensory deficit.     Motor: No weakness.  Psychiatric:        Mood and Affect: Mood normal.        Behavior: Behavior normal.     Assessment & Plan:   Problem List Items Addressed This Visit      Cardiovascular and Mediastinum   Essential hypertension (Chronic)    BP Readings from Last 1 Encounters:  05/17/20 (!) 148/78   Elevated today If persistently elevated in subsequent visit, will increase Amlodipine dose On Lasix 40 mg QD, mostly for LE swelling Counseled for compliance with the medications Advised DASH diet and moderate exercise/walking as tolerated        Endocrine   Hypothyroidism    On Levothyroxine 25 mcg QD Check TSH      Relevant Orders   TSH     Other   HLD (hyperlipidemia)    On Pravastatin Check lipid profile      Relevant Orders   Lipid panel   Depression, recurrent ([place])    On Celexa      Encounter to establish care - Primary    Care established Previous chart reviewed History and medications reviewed with the patient       Relevant  Orders   CBC with Differential   CMP14+EGFR   Vitamin D (25 hydroxy)   Scalp mass    Appears to be superficial, but patient c/o pain in the area extending to the ear. Reports chronic headache and distant  h/o fall, but no injury to that part. Check CT head      Relevant Orders   CT Head Wo Contrast   Chronic back pain    Follows up with Orthopedic surgery On Norco PRN Advised to avoid Tylenol while taking Norco       Other Visit Diagnoses    History of prediabetes       Relevant Orders   Hemoglobin A1c   Lipid panel      Outpatient Encounter Medications as of 05/17/2020  Medication Sig  . amLODipine (NORVASC) 5 MG tablet Take 1 tablet (5 mg total) by mouth daily.  Marland Kitchen aspirin 81 MG tablet Take 81 mg by mouth daily.  . brimonidine (ALPHAGAN P) 0.1 % SOLN Place 1 drop into both eyes 2 (two) times daily. One drop each eye twice daily as needed  . citalopram (CELEXA) 20 MG tablet TAKE (1) TABLET BY MOUTH ONCE DAILY.  . furosemide (LASIX) 40 MG tablet   . HYDROcodone-acetaminophen (NORCO) 7.5-325 MG tablet Take 1 tablet by mouth every 6 (six) hours as needed for moderate pain. 30 days  . ibuprofen (ADVIL) 600 MG tablet Take 1 tablet (600 mg total) by mouth every 8 (eight) hours as needed.  Marland Kitchen levothyroxine (SYNTHROID, LEVOTHROID) 25 MCG tablet TAKE (1) TABLET BY MOUTH ONCE DAILY.  Marland Kitchen lisinopril (ZESTRIL) 20 MG tablet Take 20 mg by mouth daily.  Marland Kitchen loteprednol (LOTEMAX) 0.5 % ophthalmic suspension Place 1 drop into the left eye 4 (four) times daily.  . Multiple Vitamin (MULTIVITAMIN) tablet Take 1 tablet by mouth daily.  . potassium chloride (MICRO-K) 10 MEQ CR capsule   . vitamin B-12 (CYANOCOBALAMIN) 1000 MCG tablet Take 1,000 mcg by mouth daily.  . calcium-vitamin D (OSCAL 500/200 D-3) 500-200 MG-UNIT per tablet Take 1 tablet by mouth 2 (two) times daily.  . pravastatin (PRAVACHOL) 20 MG tablet Take 1 tablet (20 mg total) by mouth daily.  . [DISCONTINUED] acyclovir (ZOVIRAX) 400 MG  tablet TAKE 1 TABLET BY MOUTH 3 TIMES DAILY. (Patient not taking: Reported on 05/17/2020)  . [DISCONTINUED] colchicine 0.6 MG tablet Take 0.6 mg by mouth 2 (two) times daily. (Patient not taking: Reported on 05/17/2020)  . [DISCONTINUED] cyclobenzaprine (FLEXERIL) 10 MG tablet Take 1 tablet (10 mg total) by mouth as directed. (Patient not taking: Reported on 05/17/2020)  . [DISCONTINUED] gabapentin (NEURONTIN) 300 MG capsule  (Patient not taking: Reported on 05/17/2020)  . [DISCONTINUED] levothyroxine (SYNTHROID, LEVOTHROID) 50 MCG tablet  (Patient not taking: Reported on 05/17/2020)  . [DISCONTINUED] metoprolol succinate (TOPROL-XL) 50 MG 24 hr tablet TAKE 1 TABLET ONCE A DAY AFTER A MEAL. (Patient not taking: Reported on 05/17/2020)  . [DISCONTINUED] omeprazole (PRILOSEC) 20 MG capsule  (Patient not taking: Reported on 05/17/2020)  . [DISCONTINUED] spironolactone (ALDACTONE) 50 MG tablet TAKE (1) TABLET TWICE DAILY. (Patient not taking: Reported on 05/17/2020)  . [DISCONTINUED] valACYclovir (VALTREX) 1000 MG tablet  (Patient not taking: Reported on 05/17/2020)   No facility-administered encounter medications on file as of 05/17/2020.    Follow-up: Return in about 3 months (around 08/15/2020).   Lindell Spar, MD

## 2020-05-17 NOTE — Assessment & Plan Note (Signed)
Appears to be superficial, but patient c/o pain in the area extending to the ear. Reports chronic headache and distant h/o fall, but no injury to that part. Check CT head

## 2020-05-17 NOTE — Assessment & Plan Note (Signed)
On Pravastatin Check lipid profile 

## 2020-05-17 NOTE — Assessment & Plan Note (Signed)
Care established Previous chart reviewed History and medications reviewed with the patient 

## 2020-05-17 NOTE — Assessment & Plan Note (Signed)
On Levothyroxine 25 mcg QD Check TSH

## 2020-05-22 ENCOUNTER — Ambulatory Visit (HOSPITAL_COMMUNITY): Payer: Medicare Other

## 2020-05-29 ENCOUNTER — Telehealth: Payer: Self-pay | Admitting: Orthopaedic Surgery

## 2020-05-29 MED ORDER — HYDROCODONE-ACETAMINOPHEN 7.5-325 MG PO TABS: 1.0000 | ORAL_TABLET | Freq: Four times a day (QID) | ORAL | 0 refills | Status: DC | PRN

## 2020-05-29 NOTE — Telephone Encounter (Signed)
Patient requests refill on Hydrocodone/Acetaminophen 7.5-325 Mgs. Qty 60   GAY:GEFU 1 tablet by mouth every 6 (six) hours as needed for moderate pain. 30 days  Patient states she uses [facility]

## 2020-06-05 ENCOUNTER — Other Ambulatory Visit: Payer: Self-pay

## 2020-06-05 ENCOUNTER — Ambulatory Visit: Payer: No Typology Code available for payment source | Admitting: Orthopaedic Surgery

## 2020-06-06 ENCOUNTER — Ambulatory Visit: Payer: No Typology Code available for payment source | Admitting: Internal Medicine

## 2020-06-07 ENCOUNTER — Ambulatory Visit: Payer: No Typology Code available for payment source | Admitting: Nurse Practitioner

## 2020-07-10 ENCOUNTER — Encounter: Payer: Self-pay | Admitting: *Deleted

## 2020-07-11 ENCOUNTER — Ambulatory Visit (INDEPENDENT_AMBULATORY_CARE_PROVIDER_SITE_OTHER): Payer: No Typology Code available for payment source | Admitting: Neurology

## 2020-07-11 ENCOUNTER — Other Ambulatory Visit: Payer: Self-pay

## 2020-07-11 ENCOUNTER — Telehealth: Payer: Self-pay | Admitting: Neurology

## 2020-07-11 ENCOUNTER — Encounter: Payer: Self-pay | Admitting: Neurology

## 2020-07-11 VITALS — BP 174/94 | HR 83 | Ht 65.5 in | Wt 180.2 lb

## 2020-07-11 DIAGNOSIS — M542 Cervicalgia: Secondary | ICD-10-CM | POA: Diagnosis not present

## 2020-07-11 DIAGNOSIS — R519 Headache, unspecified: Secondary | ICD-10-CM | POA: Diagnosis not present

## 2020-07-11 MED ORDER — TOPIRAMATE 25 MG PO TABS: 25.0000 mg | ORAL_TABLET | Freq: Two times a day (BID) | ORAL | 3 refills | Status: DC

## 2020-07-11 NOTE — Telephone Encounter (Signed)
Medicare/[place] fun order sent to [facility]. No auth they will reach out to the patient to schedule.

## 2020-07-11 NOTE — Patient Instructions (Signed)
I had a long discussion with the patient and her neighbor friend regarding her chronic daily headaches and neck pain which sounds like possible tension headaches with component of analgesic rebound.  I recommend she discontinue over-the-counter analgesics and try to cut back and minimize using her narcotics for hip pain also.  I recommend she do regular neck stretching exercises and use local heat to help with the neck pain.  Check ESR, CMP CBC as well as MRI scan of the cervical spine and brain.  Trial of Topamax 25 mg twice daily to help with headaches.  She will return for follow-up in 2 months or call earlier if necessary.  Neck Exercises Ask your health care provider which exercises are safe for you. Do exercises exactly as told by your health care provider and adjust them as directed. It is normal to feel mild stretching, pulling, tightness, or discomfort as you do these exercises. Stop right away if you feel sudden pain or your pain gets worse. Do not begin these exercises until told by your health care provider. Neck exercises can be important for many reasons. They can improve strength and maintain flexibility in your neck, which will help your upper back and prevent neck pain. Stretching exercises Rotation neck stretching 1. Sit in a chair or stand up. 2. Place your feet flat on the floor, shoulder width apart. 3. Slowly turn your head (rotate) to the right until a slight stretch is felt. Turn it all the way to the right so you can look over your right shoulder. Do not tilt or tip your head. 4. Hold this position for 10-30 seconds. 5. Slowly turn your head (rotate) to the left until a slight stretch is felt. Turn it all the way to the left so you can look over your left shoulder. Do not tilt or tip your head. 6. Hold this position for 10-30 seconds. Repeat __________ times. Complete this exercise __________ times a day.   Neck retraction 1. Sit in a sturdy chair or stand up. 2. Look  straight ahead. Do not bend your neck. 3. Use your fingers to push your chin backward (retraction). Do not bend your neck for this movement. Continue to face straight ahead. If you are doing the exercise properly, you will feel a slight sensation in your throat and a stretch at the back of your neck. 4. Hold the stretch for 1-2 seconds. Repeat __________ times. Complete this exercise __________ times a day. Strengthening exercises Neck press 1. Lie on your back on a firm bed or on the floor with a pillow under your head. 2. Use your neck muscles to push your head down on the pillow and straighten your spine. 3. Hold the position as well as you can. Keep your head facing up (in a neutral position) and your chin tucked. 4. Slowly count to 5 while holding this position. Repeat __________ times. Complete this exercise __________ times a day. Isometrics These are exercises in which you strengthen the muscles in your neck while keeping your neck still (isometrics). 1. Sit in a supportive chair and place your hand on your forehead. 2. Keep your head and face facing straight ahead. Do not flex or extend your neck while doing isometrics. 3. Push forward with your head and neck while pushing back with your hand. Hold for 10 seconds. 4. Do the sequence again, this time putting your hand against the back of your head. Use your head and neck to push backward against the hand  pressure. 5. Finally, do the same exercise on either side of your head, pushing sideways against the pressure of your hand. Repeat __________ times. Complete this exercise __________ times a day. Prone head lifts 1. Lie face-down (prone position), resting on your elbows so that your chest and upper back are raised. 2. Start with your head facing downward, near your chest. Position your chin either on or near your chest. 3. Slowly lift your head upward. Lift until you are looking straight ahead. Then continue lifting your head as far  back as you can comfortably stretch. 4. Hold your head up for 5 seconds. Then slowly lower it to your starting position. Repeat __________ times. Complete this exercise __________ times a day. Supine head lifts 1. Lie on your back (supine position), bending your knees to point to the ceiling and keeping your feet flat on the floor. 2. Lift your head slowly off the floor, raising your chin toward your chest. 3. Hold for 5 seconds. Repeat __________ times. Complete this exercise __________ times a day. Scapular retraction 1. Stand with your arms at your sides. Look straight ahead. 2. Slowly pull both shoulders (scapulae) backward and downward (retraction) until you feel a stretch between your shoulder blades in your upper back. 3. Hold for 10-30 seconds. 4. Relax and repeat. Repeat __________ times. Complete this exercise __________ times a day. Contact a health care provider if:  Your neck pain or discomfort gets much worse when you do an exercise.  Your neck pain or discomfort does not improve within 2 hours after you exercise. If you have any of these problems, stop exercising right away. Do not do the exercises again unless your health care provider says that you can. Get help right away if:  You develop sudden, severe neck pain. If this happens, stop exercising right away. Do not do the exercises again unless your health care provider says that you can. This information is not intended to replace advice given to you by your health care provider. Make sure you discuss any questions you have with your health care provider. Document Revised: 02/02/2018 Document Reviewed: 02/02/2018 Elsevier Patient Education  2021 [place].  Tension Headache, Adult A tension headache is a feeling of pain, pressure, or aching in the head. It is often felt over the front and sides of the head. Tension headaches can last from 30 minutes to several days. What are the causes? The cause of this  condition is not known. Sometimes, tension headaches are brought on by stress, worry (anxiety), or depression. Other things that may set them off include:  Alcohol.  Too much caffeine or caffeine withdrawal.  Colds, flu, or sinus infections.  Dental problems. This can include clenching your teeth.  Being tired.  Holding your head and neck in the same position for a long time, such as while using a computer.  Smoking.  Arthritis in the neck. What are the signs or symptoms?  Feeling pressure around the head.  A dull ache in the head.  Pain over the front and sides of the head.  Feeling sore or tender in the muscles of the head, neck, and shoulders. How is this treated? This condition may be treated with lifestyle changes and with medicines that help relieve symptoms. Follow these instructions at home: Managing pain  Take over-the-counter and prescription medicines only as told by your doctor.  When you have a headache, lie down in a dark, quiet room.  If told, put ice on your head  and neck. To do this: ? Put ice in a plastic bag. ? Place a towel between your skin and the bag. ? Leave the ice on for 20 minutes, 2-3 times a day. ? Take off the ice if your skin turns bright red. This is very important. If you cannot feel pain, heat, or cold, you have a greater risk of damage to the area.  If told, put heat on the back of your neck. Do this as often as told by your doctor. Use the heat source that your doctor recommends, such as a moist heat pack or a heating pad. ? Place a towel between your skin and the heat source. ? Leave the heat on for 20-30 minutes. ? Take off the heat if your skin turns bright red. This is very important. If you cannot feel pain, heat, or cold, you have a greater risk of getting burned. Eating and drinking  Eat meals on a regular schedule.  If you drink alcohol: ? Limit how much you have to:  0-1 drink a day for women who are not pregnant.  0-2  drinks a day for men. ? Know how much alcohol is in your drink. In the [place] one drink equals one 12 oz bottle of beer (355 mL), one 5 oz glass of wine (148 mL), or one 1 oz glass of hard liquor (44 mL).  Drink enough fluid to keep your pee (urine) pale yellow.  Do not use a lot of caffeine, or stop using caffeine. Lifestyle  Get 7-9 hours of sleep each night. Or get the amount of sleep that your doctor tells you to.  At bedtime, keep computers, phones, and tablets out of your room.  Find ways to lessen your stress. This may include: ? Exercise. ? Deep breathing. ? Yoga. ? Listening to music. ? Thinking positive thoughts.  Sit up straight. Try to relax your muscles.  Do not smoke or use any products that contain nicotine or tobacco. If you need help quitting, ask your doctor. General instructions  Avoid things that can bring on headaches. Keep a headache journal to see what may bring on headaches. For example, write down: ? What you eat and drink. ? How much sleep you get. ? Any change to your diet or medicines.  Keep all follow-up visits.   Contact a doctor if:  Your headache does not get better.  Your headache comes back.  You have a headache, and sounds, light, or smells bother you.  You feel like you may vomit, or you vomit.  Your stomach hurts. Get help right away if:  You all of a sudden get a very bad headache with any of these things: ? A stiff neck. ? Feeling like you may vomit. ? Vomiting. ? Feeling mixed up (confused). ? Feeling weak in one part or one side of your body. ? Having trouble seeing or speaking, or both. ? Feeling short of breath. ? A rash. ? Feeling very sleepy. ? Pain in your eye or ear. ? Trouble walking or balancing. ? Feeling like you will faint, or you faint. Summary  A tension headache is pain, pressure, or aching in your head.  Tension headaches can last from 30 minutes to several days.  Lifestyle changes and medicines may  help relieve pain. This information is not intended to replace advice given to you by your health care provider. Make sure you discuss any questions you have with your health care provider. Document Revised: 01/04/2020 Document  Reviewed: 01/04/2020 Elsevier Patient Education  [place].

## 2020-07-11 NOTE — Progress Notes (Signed)
[facility] [address] [place]. [place] [address] [phone]       OFFICE CONSULT NOTE  Linda Richardson Date of Birth:  Apr 29, 1923 Medical Record Number:  [id number]   Referring MD: Standley Brooking   PA-C  Reason for Referral: Headache  HPI: Ms. Linda Richardson is a pleasant 87 year old African-American lady seen today for initial office consultation visit for headache.  She is accompanied by her neighbor who is also a her caretaker.  History is obtained from them, review of referral notes and electronic medical records.  Reviewed pertinent available imaging films in PACS.  She has past medical history of hypertension, hyperlipidemia, hypothyroidism, glaucoma, diabetes and bladder cancer.  She states she has had no prior history of migraines or headaches but started getting headaches the last 2 years or so.  Probably of year and a half.  Last several months the headaches have progressed and now occur on a daily basis.  She describes the headache is been mostly in the left temporal regions.  At times that headache is described as being burning at times like a pulling sensation.  She does complain of significant neck pain and spasm and restriction of neck movements as well.  She had a fall and hurt her hip about 3 years ago since then she has been on narcotics which help not only her hip pain but also headaches.  She in addition to 2 tablets of Norco per day she also takes couple of tablets of over-the-counter analgesics as well but they do not help her headache as well.  She denies any visual symptoms with the headaches, nausea, vomiting or focal neurological symptoms.  She denies symptoms of any scalp tenderness, jaw claudication or myalgias.  She has not had any vision loss with the headaches.  She has no prior history of migraine headaches.  There is no prior history of strokes, TIA, seizures, close head injury with loss of consciousness.  She is able to ambulate with a cane  due to her hip pain.  She did undergo CT scan of the head without contrast on 02/28/2020 which showed mild age-related generalized atrophy and microvascular changes.  No acute abnormality.  Review of electronic medical records did not show any recent lab work.  ROS:   14 system review of systems is positive for headache, neck pain, restriction of neck movements, imbalance all other systems negative  PMH:  Past Medical History:  Diagnosis Date  . Anxiety   . Bladder cancer ([place])   . Diabetes ([employer])   . Dysuria-frequency syndrome   . Glaucoma    2006  . Headache   . Hyperlipidemia   . Hypertension   . Hypothyroidism   . Obesity     Social History:  Social History   Socioeconomic History  . Marital status: Widowed    Spouse name: Not on file  . Number of children: 3  . Years of education: Not on file  . Highest education level: Not on file  Occupational History    Comment: retired  Tobacco Use  . Smoking status: Former Research scientist (life sciences)  . Smokeless tobacco: Never Used  Substance and Sexual Activity  . Alcohol use: No  . Drug use: No  . Sexual activity: Not Currently  Other Topics Concern  . Not on file  Social History Narrative   07/11/20 lives alone   Social Determinants of Health   Financial Resource Strain: Not on file  Food Insecurity: Not on file  Transportation  Needs: Not on file  Physical Activity: Not on file  Stress: Not on file  Social Connections: Not on file  Intimate Partner Violence: Not on file    Medications:   Current Outpatient Medications on File Prior to Visit  Medication Sig Dispense Refill  . amLODipine (NORVASC) 5 MG tablet Take 1 tablet (5 mg total) by mouth daily. 30 tablet 0  . aspirin 81 MG tablet Take 81 mg by mouth daily.    . brimonidine (ALPHAGAN P) 0.1 % SOLN Place 1 drop into both eyes 2 (two) times daily. One drop each eye twice daily as needed    . calcium-vitamin D (OSCAL 500/200 D-3) 500-200 MG-UNIT per tablet Take 1 tablet by mouth 2  (two) times daily. 180 tablet 3  . citalopram (CELEXA) 20 MG tablet TAKE (1) TABLET BY MOUTH ONCE DAILY. 30 tablet 0  . furosemide (LASIX) 40 MG tablet     . gabapentin (NEURONTIN) 300 MG capsule 300 mg daily.    Marland Kitchen HYDROcodone-acetaminophen (NORCO) 7.5-325 MG tablet Take 1 tablet by mouth every 6 (six) hours as needed for moderate pain. 30 days 60 tablet 0  . ibuprofen (ADVIL) 600 MG tablet Take 1 tablet (600 mg total) by mouth every 8 (eight) hours as needed. 100 tablet 5  . levothyroxine (SYNTHROID) 50 MCG tablet Take 50 mcg by mouth daily.    Marland Kitchen lisinopril (ZESTRIL) 20 MG tablet Take 20 mg by mouth daily.    Marland Kitchen loteprednol (LOTEMAX) 0.5 % ophthalmic suspension Place 1 drop into the left eye 4 (four) times daily.    . Multiple Vitamin (MULTIVITAMIN) tablet Take 1 tablet by mouth daily.    . potassium chloride (MICRO-K) 10 MEQ CR capsule     . pravastatin (PRAVACHOL) 20 MG tablet Take 1 tablet (20 mg total) by mouth daily. 30 tablet 0  . tiZANidine (ZANAFLEX) 4 MG tablet Take 4 mg by mouth at bedtime.    . vitamin B-12 (CYANOCOBALAMIN) 1000 MCG tablet Take 1,000 mcg by mouth daily.     No current facility-administered medications on file prior to visit.    Allergies:   Allergies  Allergen Reactions  . Avapro [Irbesartan]     Dry cough  . Penicillins   . Sulfonamide Derivatives     Physical Exam General: Pleasant elderly African-American lady seated, in no evident distress Head: head normocephalic and atraumatic.   Neck: supple with no carotid or supraclavicular bruits Cardiovascular: regular rate and rhythm, no murmurs Musculoskeletal: no deformity but significant neck posterior muscle spasm with restriction of movements. Skin:  no rash/petichiae Vascular:  Normal pulses all extremities  Neurologic Exam Mental Status: Awake and fully alert. Oriented to place and time. Recent and remote memory intact. Attention span, concentration and fund of knowledge appropriate. Mood and affect  appropriate.  Cranial Nerves: Fundoscopic exam difficult through undilated pupil.. Pupils equal, briskly reactive to light. Extraocular movements full without nystagmus. Visual fields full to confrontation. Hearing mildly diminished bilaterally. Facial sensation intact. Face, tongue, palate moves normally and symmetrically.  Motor: Normal bulk and tone. Normal strength in all tested extremity muscles. Sensory.: intact to touch , pinprick , position and vibratory sensation.  Coordination: Rapid alternating movements normal in all extremities. Finger-to-nose and heel-to-shin performed accurately bilaterally. Gait and Station: Arises from chair with mild difficulty. Stance is slightly stooped.  Uses a cane.. Gait demonstrates normal stride length and balance .  Not able to heel, toe and tandem walk without difficulty.  Reflexes: 1+ and symmetric.  Toes downgoing.       ASSESSMENT: 87 year old African-American lady with chronic daily headaches for the last couple of years likely combination of muscle tension headaches with analgesic rebound.     PLAN: I had a long discussion with the patient and her neighbor friend regarding her chronic daily headaches and neck pain which sounds like possible tension headaches with component of analgesic rebound.  I recommend she discontinue over-the-counter analgesics and try to cut back and minimize using her narcotics for hip pain also.  I recommend she do regular neck stretching exercises and use local heat to help with the neck pain.  Check ESR, CMP CBC as well as MRI scan of the cervical spine and brain.  Trial of Topamax 25 mg twice daily to help with headaches.  She will return for follow-up in 2 months or call earlier if necessary.  Greater than 50% time during this 45-minute consultation visit was spent on counseling and coordination of care about her chronic daily headaches and answering questions Antony Contras, MD  [facility] [address] [place], [place] [address]  Phone [phone] Fax [phone] Note: This document was prepared with digital dictation and possible smart phrase technology. Any transcriptional errors that result from this process are unintentional.

## 2020-07-19 ENCOUNTER — Other Ambulatory Visit: Payer: No Typology Code available for payment source

## 2020-07-19 ENCOUNTER — Inpatient Hospital Stay: Admission: RE | Admit: 2020-07-19 | Payer: No Typology Code available for payment source | Source: Ambulatory Visit

## 2020-07-22 ENCOUNTER — Telehealth: Payer: Self-pay

## 2020-07-22 NOTE — Telephone Encounter (Signed)
Hydrocodone-Acetaminophen  7.5/325mg   Qty 60 Tablets  PATIENT USES [name]

## 2020-07-23 MED ORDER — HYDROCODONE-ACETAMINOPHEN 7.5-325 MG PO TABS: 1.0000 | ORAL_TABLET | Freq: Four times a day (QID) | ORAL | 0 refills | Status: DC | PRN

## 2020-07-29 ENCOUNTER — Ambulatory Visit
Admission: RE | Admit: 2020-07-29 | Discharge: 2020-07-29 | Disposition: A | Discharge status: Home / Self Care | Payer: Medicare Other | Source: Ambulatory Visit | Attending: Neurology | Admitting: Neurology

## 2020-07-29 ENCOUNTER — Other Ambulatory Visit: Payer: Self-pay

## 2020-07-29 DIAGNOSIS — R519 Headache, unspecified: Secondary | ICD-10-CM

## 2020-07-29 DIAGNOSIS — M542 Cervicalgia: Secondary | ICD-10-CM

## 2020-07-29 MED ORDER — GADOBENATE DIMEGLUMINE 529 MG/ML IV SOLN
17.0000 mL | Freq: Once | INTRAVENOUS | Status: AC | PRN
  Administered 2020-07-29: 17 mL via INTRAVENOUS

## 2020-07-29 NOTE — Progress Notes (Signed)
Kindly inform the patient that MRI scan of the neck spine shows evidence of age-related wear-and-tear changes between the third to the sixth vertebrae with moderate narrowing of the bony opening through which the nerve comes out on the right side between the fifth and sixth vertebrae with possible nerve root impingement.  There does not appear to be significant compression of the spinal cord or spinal stenosis

## 2020-07-29 NOTE — Progress Notes (Signed)
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  No new strokes.  Changes of hardening of the arteries and shrinkage of the brain which are appropriate for her age.  No new or worrisome finding.

## 2020-07-30 ENCOUNTER — Ambulatory Visit: Payer: No Typology Code available for payment source | Admitting: Orthopaedic Surgery

## 2020-07-30 ENCOUNTER — Ambulatory Visit
Admission: RE | Admit: 2020-07-30 | Discharge: 2020-07-30 | Disposition: A | Discharge status: Home / Self Care | Payer: Medicare Other | Source: Ambulatory Visit | Attending: Internal Medicine | Admitting: Internal Medicine

## 2020-07-30 DIAGNOSIS — R220 Localized swelling, mass and lump, head: Secondary | ICD-10-CM

## 2020-07-31 ENCOUNTER — Other Ambulatory Visit: Payer: Self-pay

## 2020-07-31 ENCOUNTER — Ambulatory Visit (INDEPENDENT_AMBULATORY_CARE_PROVIDER_SITE_OTHER): Payer: No Typology Code available for payment source | Admitting: Orthopaedic Surgery

## 2020-07-31 ENCOUNTER — Encounter: Payer: Self-pay | Admitting: Orthopaedic Surgery

## 2020-07-31 VITALS — Ht 65.0 in | Wt 170.1 lb

## 2020-07-31 DIAGNOSIS — M5441 Lumbago with sciatica, right side: Secondary | ICD-10-CM | POA: Diagnosis not present

## 2020-07-31 DIAGNOSIS — G8929 Other chronic pain: Secondary | ICD-10-CM

## 2020-07-31 NOTE — Progress Notes (Signed)
Patient Linda Richardson, female DOB:05-30-1923, 86 y.o. WER:[id number]  No chief complaint on file.   HPI  Linda Richardson is a 87 y.o. female who has lower back pain and right hip pain.  She has no new trauma.  She is very active.  She is taking her medicine.  She has no weakness.  She has no numbness.  She is doing her exercises.  She does not look or act her age at all.   There is no height or weight on file to calculate BMI.  ROS  Review of Systems  Constitutional: Positive for activity change.       Patient has Diabetes Mellitus. Patient has hypertension. Patient does not have COPD or shortness of breath. Patient has BMI > 35. Patient does not have current smoking history.  HENT: Negative for congestion.   Respiratory: Negative for cough and shortness of breath.   Cardiovascular: Negative for chest pain and leg swelling.  Endocrine: Positive for cold intolerance.  Musculoskeletal: Positive for arthralgias, back pain, joint swelling and myalgias.  Allergic/Immunologic: Negative for environmental allergies.  Psychiatric/Behavioral: The patient is nervous/anxious.   All other systems reviewed and are negative.   All other systems reviewed and are negative.  The following is a summary of the past history medically, past history surgically, known current medicines, social history and family history.  This information is gathered electronically by the computer from prior information and documentation.  I review this each visit and have found including this information at this point in the chart is beneficial and informative.    Past Medical History:  Diagnosis Date  . Anxiety   . Bladder cancer ([place])   . Diabetes ([place])   . Dysuria-frequency syndrome   . Glaucoma    2006  . Headache   . Hyperlipidemia   . Hypertension   . Hypothyroidism   . Obesity     Past Surgical History:  Procedure Laterality Date  . ABDOMINAL HYSTERECTOMY    . Bilateral cataract Surgery      2004  . DILATION AND CURETTAGE OF UTERUS     x3  . Surgery on bladder for cancer    . VESICOVAGINAL FISTULA CLOSURE W/ TAH      Family History  Problem Relation Age of Onset  . Stroke Mother 66  . Diabetes Sister   . Hypertension Sister     Social History Social History   Tobacco Use  . Smoking status: Former Research scientist (life sciences)  . Smokeless tobacco: Never Used  Substance Use Topics  . Alcohol use: No  . Drug use: No    Allergies  Allergen Reactions  . Avapro [Irbesartan]     Dry cough  . Penicillins   . Sulfonamide Derivatives     Current Outpatient Medications  Medication Sig Dispense Refill  . amLODipine (NORVASC) 5 MG tablet Take 1 tablet (5 mg total) by mouth daily. 30 tablet 0  . aspirin 81 MG tablet Take 81 mg by mouth daily.    . brimonidine (ALPHAGAN P) 0.1 % SOLN Place 1 drop into both eyes 2 (two) times daily. One drop each eye twice daily as needed    . calcium-vitamin D (OSCAL 500/200 D-3) 500-200 MG-UNIT per tablet Take 1 tablet by mouth 2 (two) times daily. 180 tablet 3  . citalopram (CELEXA) 20 MG tablet TAKE (1) TABLET BY MOUTH ONCE DAILY. 30 tablet 0  . furosemide (LASIX) 40 MG tablet     . gabapentin (NEURONTIN) 300 MG capsule  300 mg daily.    Marland Kitchen HYDROcodone-acetaminophen (NORCO) 7.5-325 MG tablet Take 1 tablet by mouth every 6 (six) hours as needed for moderate pain. 30 days 60 tablet 0  . ibuprofen (ADVIL) 600 MG tablet Take 1 tablet (600 mg total) by mouth every 8 (eight) hours as needed. 100 tablet 5  . levothyroxine (SYNTHROID) 50 MCG tablet Take 50 mcg by mouth daily.    Marland Kitchen lisinopril (ZESTRIL) 20 MG tablet Take 20 mg by mouth daily.    Marland Kitchen loteprednol (LOTEMAX) 0.5 % ophthalmic suspension Place 1 drop into the left eye 4 (four) times daily.    . Multiple Vitamin (MULTIVITAMIN) tablet Take 1 tablet by mouth daily.    . potassium chloride (MICRO-K) 10 MEQ CR capsule     . pravastatin (PRAVACHOL) 20 MG tablet Take 1 tablet (20 mg total) by mouth daily. 30  tablet 0  . tiZANidine (ZANAFLEX) 4 MG tablet Take 4 mg by mouth at bedtime.    . topiramate (TOPAMAX) 25 MG tablet Take 1 tablet (25 mg total) by mouth 2 (two) times daily. 120 tablet 3  . vitamin B-12 (CYANOCOBALAMIN) 1000 MCG tablet Take 1,000 mcg by mouth daily.     No current facility-administered medications for this visit.     Physical Exam  There were no vitals taken for this visit.  Constitutional: overall normal hygiene, normal nutrition, well developed, normal grooming, normal body habitus. Assistive device:none  Musculoskeletal: gait and station Limp none, muscle tone and strength are normal, no tremors or atrophy is present.  .  Neurological: coordination overall normal.  Deep tendon reflex/nerve stretch intact.  Sensation normal.  Cranial nerves II-XII intact.   Skin:   Normal overall no scars, lesions, ulcers or rashes. No psoriasis.  Psychiatric: Alert and oriented x 3.  Recent memory intact, remote memory unclear.  Normal mood and affect. Well groomed.  Good eye contact.  Cardiovascular: overall no swelling, no varicosities, no edema bilaterally, normal temperatures of the legs and arms, no clubbing, cyanosis and good capillary refill.  Lymphatic: palpation is normal.  Spine/Pelvis examination:  Inspection:  Overall, sacoiliac joint benign and hips nontender; without crepitus or defects.   Thoracic spine inspection: Alignment normal without kyphosis present   Lumbar spine inspection:  Alignment  with normal lumbar lordosis, without scoliosis apparent.   Thoracic spine palpation:  without tenderness of spinal processes   Lumbar spine palpation: without tenderness of lumbar area; without tightness of lumbar muscles    Range of Motion:   Lumbar flexion, forward flexion is normal without pain or tenderness    Lumbar extension is full without pain or tenderness   Left lateral bend is normal without pain or tenderness   Right lateral bend is normal without pain  or tenderness   Straight leg raising is normal  Strength & tone: normal   Stability overall normal stability  All other systems reviewed and are negative   She is amazingly spry.    The patient has been educated about the nature of the problem(s) and counseled on treatment options.  The patient appeared to understand what I have discussed and is in agreement with it.  Encounter Diagnosis  Name Primary?  . Chronic right-sided low back pain with right-sided sciatica Yes    PLAN Call if any problems.  Precautions discussed.  Continue current medications.   Return to clinic 3 months   Electronically Signed Sanjuana Kava, MD 4/13/202210:18 AM

## 2020-08-01 ENCOUNTER — Telehealth: Payer: Self-pay | Admitting: *Deleted

## 2020-08-01 NOTE — Telephone Encounter (Signed)
Called caregiver Iona Beard, on DPR informed him the MRI scan of the brain shows evidence of old stroke on the right side towards the front and side as well as smaller stroke on the back on the right. No new strokes. Changes of hardening of the arteries and shrinkage of the brain which are appropriate for her age. No new or worrisome finding.  MRI scan of the neck spine shows evidence of age-related wear-and-tear changes between the third to the sixth vertebrae with moderate narrowing of the bony opening through which the nerve comes out on the right side between the fifth and sixth vertebrae with possible nerve root impingement. There does not appear to be significant compression of the spinal cord or spinal stenosis. He asked of he could get copies; I advised patient must sign release of information. He stated he will pass along results to her, verbalized understanding, appreciation.

## 2020-08-15 ENCOUNTER — Ambulatory Visit: Payer: No Typology Code available for payment source | Admitting: Internal Medicine

## 2020-09-19 ENCOUNTER — Ambulatory Visit: Payer: Medicare Other | Admitting: Neurology

## 2020-10-02 ENCOUNTER — Ambulatory Visit: Payer: Medicare Other | Admitting: Adult Health

## 2020-10-10 ENCOUNTER — Emergency Department (HOSPITAL_COMMUNITY): Payer: Medicare Other

## 2020-10-10 ENCOUNTER — Emergency Department (HOSPITAL_COMMUNITY)
Admission: EM | Admit: 2020-10-10 | Discharge: 2020-10-10 | Disposition: A | Discharge status: Home / Self Care | Payer: Medicare Other | Attending: Emergency Medicine | Admitting: Emergency Medicine

## 2020-10-10 ENCOUNTER — Other Ambulatory Visit: Payer: Self-pay

## 2020-10-10 ENCOUNTER — Encounter (HOSPITAL_COMMUNITY): Payer: Self-pay | Admitting: *Deleted

## 2020-10-10 DIAGNOSIS — E119 Type 2 diabetes mellitus without complications: Secondary | ICD-10-CM | POA: Diagnosis not present

## 2020-10-10 DIAGNOSIS — Z87891 Personal history of nicotine dependence: Secondary | ICD-10-CM | POA: Diagnosis not present

## 2020-10-10 DIAGNOSIS — I10 Essential (primary) hypertension: Secondary | ICD-10-CM | POA: Diagnosis not present

## 2020-10-10 DIAGNOSIS — R609 Edema, unspecified: Secondary | ICD-10-CM

## 2020-10-10 DIAGNOSIS — E039 Hypothyroidism, unspecified: Secondary | ICD-10-CM | POA: Insufficient documentation

## 2020-10-10 DIAGNOSIS — Z8551 Personal history of malignant neoplasm of bladder: Secondary | ICD-10-CM | POA: Diagnosis not present

## 2020-10-10 DIAGNOSIS — R531 Weakness: Secondary | ICD-10-CM | POA: Diagnosis not present

## 2020-10-10 DIAGNOSIS — Z79899 Other long term (current) drug therapy: Secondary | ICD-10-CM | POA: Insufficient documentation

## 2020-10-10 DIAGNOSIS — R600 Localized edema: Secondary | ICD-10-CM | POA: Diagnosis not present

## 2020-10-10 DIAGNOSIS — Z7982 Long term (current) use of aspirin: Secondary | ICD-10-CM | POA: Insufficient documentation

## 2020-10-10 DIAGNOSIS — M7989 Other specified soft tissue disorders: Secondary | ICD-10-CM | POA: Diagnosis present

## 2020-10-10 LAB — CBC WITH DIFFERENTIAL/PLATELET
Abs Immature Granulocytes: 0.01 10*3/uL (ref 0.00–0.07)
Basophils Absolute: 0 10*3/uL (ref 0.0–0.1)
Basophils Relative: 1 %
Eosinophils Absolute: 0.1 10*3/uL (ref 0.0–0.5)
Eosinophils Relative: 2 %
HCT: 36.5 % (ref 36.0–46.0)
Hemoglobin: 11.4 g/dL — ABNORMAL LOW (ref 12.0–15.0)
Immature Granulocytes: 0 %
Lymphocytes Relative: 31 %
Lymphs Abs: 1.5 10*3/uL (ref 0.7–4.0)
MCH: 30.3 pg (ref 26.0–34.0)
MCHC: 31.2 g/dL (ref 30.0–36.0)
MCV: 97.1 fL (ref 80.0–100.0)
Monocytes Absolute: 0.6 10*3/uL (ref 0.1–1.0)
Monocytes Relative: 12 %
Neutro Abs: 2.7 10*3/uL (ref 1.7–7.7)
Neutrophils Relative %: 54 %
Platelets: 201 10*3/uL (ref 150–400)
RBC: 3.76 MIL/uL — ABNORMAL LOW (ref 3.87–5.11)
RDW: 15.5 % (ref 11.5–15.5)
WBC: 5 10*3/uL (ref 4.0–10.5)
nRBC: 0 % (ref 0.0–0.2)

## 2020-10-10 LAB — BASIC METABOLIC PANEL
Anion gap: 9 (ref 5–15)
BUN: 22 mg/dL (ref 8–23)
CO2: 22 mmol/L (ref 22–32)
Calcium: 8.6 mg/dL — ABNORMAL LOW (ref 8.9–10.3)
Chloride: 112 mmol/L — ABNORMAL HIGH (ref 98–111)
Creatinine, Ser: 1.59 mg/dL — ABNORMAL HIGH (ref 0.44–1.00)
GFR, Estimated: 30 mL/min — ABNORMAL LOW (ref >60)
Glucose, Bld: 88 mg/dL (ref 70–99)
Potassium: 3.7 mmol/L (ref 3.5–5.1)
Sodium: 143 mmol/L (ref 135–145)

## 2020-10-10 LAB — BRAIN NATRIURETIC PEPTIDE: B Natriuretic Peptide: 99 pg/mL (ref 0.0–100.0)

## 2020-10-10 NOTE — ED Provider Notes (Signed)
Emergency Medicine Provider Triage Evaluation Note  Linda Richardson , a 87 y.o. female  was evaluated in triage.  Hx type 2 diabetes, hypertension, pt complains of gradually worsening lower extremity edema.  Right greater than left.  Patient is here because she is concerned that she may have been bitten by some type of insect.  Denies chest pain or shortness of breath.  Review of Systems  Positive: Bilateral lower extremity edema Negative: Lower extremity pain, redness, chest pain or shortness of breath  Physical Exam  BP (!) 155/85 (BP Location: Right Arm)   Pulse 79   Temp 98.6 F (37 C) (Oral)   Resp 18   SpO2 99%  Gen:   Awake, no distress   Resp:  Normal effort, lungs clear to auscultation bilaterally MSK:   Moves extremities without difficulty, 1+ pitting edema of bilateral lower extremities.  No erythema or edema.  No calf pain. Other:    Medical Decision Making  Medically screening exam initiated at 2:23 PM.  Appropriate orders placed.  Linda Richardson was informed that the remainder of the evaluation will be completed by another provider, this initial triage assessment does not replace that evaluation, and the importance of remaining in the ED until their evaluation is complete.  Patient here for evaluation possible bug bite.  On exam, she has 1+ pitting edema bilateral lower extremities.  No shortness of breath, chest pain, or documented history of CHF.  She does take Lasix daily.  Well-appearing, nontoxic.  No evidence of insect bite or cellulitis.  She will need further evaluation emergency department.  She is agreeable to plan.   Kem Parkinson, PA-C 10/10/20 [place], [place], MD 10/14/20 [phone]

## 2020-10-10 NOTE — ED Notes (Signed)
Bilateral pedal pulse and both posterior tibial pulse found with doppler.

## 2020-10-10 NOTE — ED Provider Notes (Signed)
  Face-to-face evaluation   History: She reports onset of lower leg swelling for 2 weeks, associated with being on her feet more often than usual.  She denies shortness of breath, chest pain, weakness or dizziness.  She has some soreness of her left foot, where it rubs against her shoe.  She denies fever, chills, focal weakness or paresthesia  Physical exam: Alert elderly female who is conversant and cooperative.  Extremities with 2-3+ edema bilaterally extending into the foot.  Left foot is tender at the base of the fifth metatarsal.  No overlying skin change at this site.  Medical screening examination/treatment/procedure(s) were conducted as a shared visit with non-physician practitioner(s) and myself.  I personally evaluated the patient during the encounter    Daleen Bo, MD 10/21/20 2332

## 2020-10-10 NOTE — Discharge Instructions (Addendum)
As discussed, elevate your legs when possible.  Wearing compression stockings up to your knees will help with the swelling.  Also, avoid salt in your diet.  Call your primary care provider to arrange a follow-up appointment.

## 2020-10-10 NOTE — ED Triage Notes (Signed)
Swelling both lower feet, concerned she may have been bitten by an insect

## 2020-10-10 NOTE — ED Provider Notes (Signed)
[facility] EMERGENCY DEPARTMENT Provider Note   CSN: [id number] Arrival date & time: 10/10/20  1312     History Chief Complaint  Patient presents with   Foot Swelling    Linda Richardson is a 87 y.o. female.  HPI     Linda Richardson is a 87 y.o. female with history of HTN, hypothyroidism, diabetes who presents to the Emergency Department complaining of swelling of both lower extremities.  Symptoms present for several days. She is concerned that she has been bitten by an insect.  She denies pain, itching, redness.  No chest pain or shortness of breath. She denies difficulty walking or standing.  Past Medical History:  Diagnosis Date   Anxiety    Bladder cancer ([place])    Diabetes ([place])    Dysuria-frequency syndrome    Glaucoma    2006   Headache    Hyperlipidemia    Hypertension    Hypothyroidism    Obesity     Patient Active Problem List   Diagnosis Date Noted   Scalp mass 05/17/2020   Chronic back pain 05/17/2020   Medicare annual wellness visit, subsequent 04/01/2014   Encounter to establish care 04/01/2014   Tinea cruris 01/01/2014   Grief at loss of child 08/29/2013   Lung nodule, solitary 11/14/2012   Routine general medical examination at a health care facility 05/10/2012   Hearing loss 05/10/2012   Hip pain, right 05/10/2012   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     Family History  Problem  Relation Age of Onset   Stroke Mother 35   Diabetes Sister    Hypertension Sister     Social History   Tobacco Use   Smoking status: Former    Pack years: 0.00   Smokeless tobacco: Never  Substance Use Topics   Alcohol use: No   Drug use: No    Home Medications Prior to Admission medications   Medication Sig Start Date End Date Taking? Authorizing Provider  amLODipine (NORVASC) 5 MG tablet Take 1 tablet (5 mg total) by mouth daily. 04/24/14   Fayrene Helper, MD  aspirin 81 MG tablet Take 81 mg by mouth daily.    [provider]  brimonidine (ALPHAGAN P) 0.1 % SOLN Place 1 drop into both eyes 2 (two) times daily. One drop each eye twice daily as needed    [provider]  calcium-vitamin D (OSCAL 500/200 D-3) 500-200 MG-UNIT per tablet Take 1 tablet by mouth 2 (two) times daily. 11/14/12 01/02/15  Fayrene Helper, MD  citalopram (CELEXA) 20 MG tablet TAKE (1) TABLET BY MOUTH ONCE DAILY. 04/24/14   Fayrene Helper, MD  furosemide (LASIX) 40 MG tablet  12/15/16   [provider]  gabapentin (NEURONTIN) 300 MG capsule 300 mg daily. 06/06/20   [provider]  HYDROcodone-acetaminophen ([place]) 7.5-325  MG tablet Take 1 tablet by mouth every 6 (six) hours as needed for moderate pain. 30 days 07/23/20   Sanjuana Kava, MD  ibuprofen (ADVIL) 600 MG tablet Take 1 tablet (600 mg total) by mouth every 8 (eight) hours as needed. 11/17/18   Sanjuana Kava, MD  levothyroxine (SYNTHROID) 50 MCG tablet Take 50 mcg by mouth daily. 06/17/20   [provider]  lisinopril (ZESTRIL) 20 MG tablet Take 20 mg by mouth daily. 11/10/19   [provider]  loteprednol (LOTEMAX) 0.5 % ophthalmic suspension Place 1 drop into the left eye 4 (four) times daily.    [provider]  Multiple Vitamin (MULTIVITAMIN) tablet Take 1 tablet by mouth daily.    [provider]  potassium chloride (MICRO-K) 10 MEQ CR capsule  12/15/16   [provider]  pravastatin (PRAVACHOL) 20 MG tablet Take 1 tablet (20 mg total) by mouth daily. 04/24/14   Fayrene Helper, MD  tiZANidine (ZANAFLEX) 4 MG tablet Take 4 mg by mouth at bedtime. 07/04/20   [provider]  topiramate (TOPAMAX) 25 MG tablet Take 1 tablet (25 mg total) by mouth 2 (two) times daily. 07/11/20   Garvin Fila, MD  vitamin B-12 (CYANOCOBALAMIN) 1000 MCG tablet Take 1,000 mcg by mouth daily.    [provider]    Allergies    Avapro [irbesartan], Penicillins, and Sulfonamide derivatives  Review of Systems   Review of Systems  Constitutional:  Negative for appetite change, chills, fatigue and fever.  HENT:  Negative for trouble swallowing.   Respiratory:  Negative for chest tightness, shortness of breath and wheezing.   Cardiovascular:  Positive for leg swelling. Negative for chest pain and palpitations.  Gastrointestinal:  Negative for abdominal pain, nausea and vomiting.  Genitourinary:  Negative for decreased urine volume.  Musculoskeletal:  Negative for arthralgias and myalgias.  Skin:  Negative for color change, rash and wound.  Neurological:  Negative for dizziness, weakness, numbness and headaches.  Hematological:  Does not bruise/bleed easily.   Physical Exam Updated Vital Signs BP (!) 150/72 (BP Location: Right Arm)   Pulse 68   Temp 98.6 F (37 C) (Oral)   Resp 17   SpO2 99%   Physical Exam Vitals and nursing note reviewed.  Constitutional:      General: She is not in acute distress.    Appearance: Normal appearance. She is not ill-appearing or toxic-appearing.  HENT:     Mouth/Throat:     Mouth: Mucous membranes are moist.  Cardiovascular:     Rate and Rhythm: Normal rate and regular rhythm.     Pulses: Normal pulses.  Pulmonary:     Effort: Pulmonary effort is normal. No respiratory distress.     Breath sounds: Normal breath sounds. No rales.  Abdominal:     Palpations: Abdomen is soft.     Tenderness: There is  no abdominal tenderness. There is no guarding or rebound.  Musculoskeletal:        General: No tenderness or signs of injury. Normal range of motion.     Right lower leg: Edema present.     Left lower leg: Edema present.     Comments: Edema of the bilateral lower extremities from distal lower legs to dorsal feet.  No erythema, excessive warmth or skin changes.  No calf pain or edema.      Skin:    General: Skin is warm.     Capillary Refill: Capillary refill takes less than  2 seconds.     Findings: No bruising, erythema or rash.  Neurological:     General: No focal deficit present.     Mental Status: She is alert.     Sensory: No sensory deficit.     Motor: No weakness.    ED Results / Procedures / Treatments   Labs (all labs ordered are listed, but only abnormal results are displayed) Labs Reviewed  BASIC METABOLIC PANEL - Abnormal; Notable for the following components:      Result Value   Chloride 112 (*)    Creatinine, Ser 1.59 (*)    Calcium 8.6 (*)    GFR, Estimated 30 (*)    All other components within normal limits  CBC WITH DIFFERENTIAL/PLATELET - Abnormal; Notable for the following components:   RBC 3.76 (*)    Hemoglobin 11.4 (*)    All other components within normal limits  BRAIN NATRIURETIC PEPTIDE    EKG None  Radiology DG Chest 1 View  Result Date: 10/10/2020 CLINICAL DATA:  87 year old female with lower extremity edema. EXAM: CHEST  1 VIEW COMPARISON:  Chest radiograph dated 11/14/2012. FINDINGS: Mild cardiomegaly with mild vascular congestion. No focal consolidation, pleural effusion, or pneumothorax. Atherosclerotic calcification of the aorta. No acute osseous pathology. IMPRESSION: Mild cardiomegaly with mild vascular congestion. Electronically Signed   By: Anner Crete M.D.   On: 10/10/2020 15:06    Procedures Procedures   Medications Ordered in ED Medications - No data to display  ED Course  I have reviewed the triage vital signs and the  nursing notes.  Pertinent labs & imaging results that were available during my care of the patient were reviewed by me and considered in my medical decision making (see chart for details).    MDM Rules/Calculators/A&P                          Pt here with complaint of bilateral LE edema.  She was concerned that she may have been bitten by an insect.    On exam, she has bilateral peripheral edema.  No skin changes, calf pain, decreased sensation, chest pain or dyspnea.    Labs interpreted by me, no leukocytosis, kidney disease appears baseline.  BNP unremarkable.  She does take a CCB which could be contributing to her peripheral edema.  No concerning sx's of CHF.   Pt also seen by Dr. Eulis Foster and care plan discussed.  Pt agreeable to elevate, compression stocking and out pt f/u with PCP.      Final Clinical Impression(s) / ED Diagnoses Final diagnoses:  Weakness    Rx / DC Orders ED Discharge Orders     None        Kem Parkinson, PA-C 10/12/20 1604    Daleen Bo, MD 10/21/20 2333

## 2020-10-15 ENCOUNTER — Telehealth: Payer: Self-pay | Admitting: Orthopaedic Surgery

## 2020-10-15 MED ORDER — HYDROCODONE-ACETAMINOPHEN 7.5-325 MG PO TABS: 1.0000 | ORAL_TABLET | Freq: Four times a day (QID) | ORAL | 0 refills | Status: DC | PRN

## 2020-10-15 NOTE — Telephone Encounter (Signed)
Patient called requesting a refill on her   HYDROcodone-acetaminophen ([place]) 7.5-325 MG tablet   Pharmacy:  [facility]

## 2020-10-17 ENCOUNTER — Ambulatory Visit (INDEPENDENT_AMBULATORY_CARE_PROVIDER_SITE_OTHER): Payer: Medicare Other | Admitting: Neurology

## 2020-10-17 ENCOUNTER — Other Ambulatory Visit: Payer: Self-pay

## 2020-10-17 VITALS — BP 159/81 | HR 85 | Ht 65.5 in | Wt 161.8 lb

## 2020-10-17 DIAGNOSIS — R519 Headache, unspecified: Secondary | ICD-10-CM

## 2020-10-17 DIAGNOSIS — M47812 Spondylosis without myelopathy or radiculopathy, cervical region: Secondary | ICD-10-CM | POA: Diagnosis not present

## 2020-10-17 MED ORDER — TOPIRAMATE 50 MG PO TABS: 50.0000 mg | ORAL_TABLET | Freq: Two times a day (BID) | ORAL | 3 refills | Status: DC

## 2020-10-17 NOTE — Patient Instructions (Signed)
I had a long discussion with the patient regarding her headache which appears to have shown some improvement on Topamax.  I recommend we increase the dose to 50 mg twice daily.  I recommend she avoid sudden neck movements when looking down and do regular neck stretching exercises.  She will return for follow-up in the future in 3 months with my nurse practitioner Janett Billow or call earlier if necessary.

## 2020-10-17 NOTE — Progress Notes (Signed)
[facility] [address] [place]. [place] [address] [phone]       OFFICE FOLLOW UP VISIT NOTE  Ms. Linda Richardson Date of Birth:  1923-09-22 Medical Record Number:  [id number]   Referring MD: Standley Brooking   PA-C  Reason for Referral: Headache  HPI: Initial visit 07/11/2020 Ms. Linda Richardson is a pleasant 87 year old African-American lady seen today for initial office consultation visit for headache.  She is accompanied by her neighbor who is also a her caretaker.  History is obtained from them, review of referral notes and electronic medical records.  Reviewed pertinent available imaging films in PACS.  She has past medical history of hypertension, hyperlipidemia, hypothyroidism, glaucoma, diabetes and bladder cancer.  She states she has had no prior history of migraines or headaches but started getting headaches the last 2 years or so.  Probably of year and a half.  Last several months the headaches have progressed and now occur on a daily basis.  She describes the headache is been mostly in the left temporal regions.  At times that headache is described as being burning at times like a pulling sensation.  She does complain of significant neck pain and spasm and restriction of neck movements as well.  She had a fall and hurt her hip about 3 years ago since then she has been on narcotics which help not only her hip pain but also headaches.  She in addition to 2 tablets of Norco per day she also takes couple of tablets of over-the-counter analgesics as well but they do not help her headache as well.  She denies any visual symptoms with the headaches, nausea, vomiting or focal neurological symptoms.  She denies symptoms of any scalp tenderness, jaw claudication or myalgias.  She has not had any vision loss with the headaches.  She has no prior history of migraine headaches.  There is no prior history of strokes, TIA, seizures, close head injury with loss of consciousness.  She is  able to ambulate with a cane due to her hip pain.  She did undergo CT scan of the head without contrast on 02/28/2020 which showed mild age-related generalized atrophy and microvascular changes.  No acute abnormality.  Review of electronic medical records did not show any recent lab work. Update 10/17/2020 : She returns for for follow-up after last visit 3 months ago.  She states her headaches are much better.  Headaches are no longer daily and are only a few times a week related to her neck movements mostly.  She is tolerating Topamax 25 mg twice daily but she still has some headaches and is wondering if she can increase the dose.  Headaches are now mostly triggered if she bends her neck down or is trying to pick things up.  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  MRI scan cervical spine showed degenerative changes from C3-C6 with mild spinal stenosis at C3-4 and mild right-sided foraminal narrowing at C4-5 with moderate to severe right-sided foraminal narrowing at C5-6.  I discussed the findings with the patient the patient is not keen to undergo any surgical treatment at her age.  She has not been doing any neck stretching exercises.  Had ordered some lab work at last visit with patient for unclear reason did not do it. ROS:   14 system review  of systems is positive for headache, neck pain, restriction of neck movements, imbalance all other systems negative  PMH:  Past Medical History:  Diagnosis Date   Anxiety    Bladder cancer ([place])    Diabetes ([place])    Dysuria-frequency syndrome    Glaucoma    2006   Headache    Hyperlipidemia    Hypertension    Hypothyroidism    Obesity     Social History:  Social History   Socioeconomic History   Marital status: Widowed    Spouse name: Not on file   Number of children: 3   Years of  education: Not on file   Highest education level: Not on file  Occupational History    Comment: retired  Tobacco Use   Smoking status: Former    Pack years: 0.00   Smokeless tobacco: Never  Substance and Sexual Activity   Alcohol use: No   Drug use: No   Sexual activity: Not Currently  Other Topics Concern   Not on file  Social History Narrative   07/11/20 lives alone   Social Determinants of Health   Financial Resource Strain: Not on file  Food Insecurity: Not on file  Transportation Needs: Not on file  Physical Activity: Not on file  Stress: Not on file  Social Connections: Not on file  Intimate Partner Violence: Not on file    Medications:   Current Outpatient Medications on File Prior to Visit  Medication Sig Dispense Refill   amLODipine (NORVASC) 5 MG tablet Take 1 tablet (5 mg total) by mouth daily. 30 tablet 0   aspirin 81 MG tablet Take 81 mg by mouth daily.     brimonidine (ALPHAGAN P) 0.1 % SOLN Place 1 drop into both eyes 2 (two) times daily. One drop each eye twice daily as needed     citalopram (CELEXA) 20 MG tablet TAKE (1) TABLET BY MOUTH ONCE DAILY. 30 tablet 0   furosemide (LASIX) 40 MG tablet      gabapentin (NEURONTIN) 300 MG capsule 300 mg daily.     HYDROcodone-acetaminophen (NORCO) 7.5-325 MG tablet Take 1 tablet by mouth every 6 (six) hours as needed for moderate pain. 30 days 60 tablet 0   ibuprofen (ADVIL) 600 MG tablet Take 1 tablet (600 mg total) by mouth every 8 (eight) hours as needed. 100 tablet 5   levothyroxine (SYNTHROID) 50 MCG tablet Take 50 mcg by mouth daily.     lisinopril (ZESTRIL) 20 MG tablet Take 20 mg by mouth daily.     loteprednol (LOTEMAX) 0.5 % ophthalmic suspension Place 1 drop into the left eye 4 (four) times daily.     Multiple Vitamin (MULTIVITAMIN) tablet Take 1 tablet by mouth daily.     potassium chloride (MICRO-K) 10 MEQ CR capsule      pravastatin (PRAVACHOL) 20 MG tablet Take 1 tablet (20 mg total) by mouth daily. 30  tablet 0   tiZANidine (ZANAFLEX) 4 MG tablet Take 4 mg by mouth at bedtime.     vitamin B-12 (CYANOCOBALAMIN) 1000 MCG tablet Take 1,000 mcg by mouth daily.     calcium-vitamin D (OSCAL 500/200 D-3) 500-200 MG-UNIT per tablet Take 1 tablet by mouth 2 (two) times daily. 180 tablet 3   No current facility-administered medications on file prior to visit.    Allergies:   Allergies  Allergen Reactions   Avapro [Irbesartan]     Dry cough   Penicillins    Sulfonamide Derivatives  Physical Exam General: Pleasant elderly African-American lady seated, in no evident distress Head: head normocephalic and atraumatic.   Neck: supple with no carotid or supraclavicular bruits Cardiovascular: regular rate and rhythm, no murmurs Musculoskeletal: no deformity but significant neck posterior muscle spasm with restriction of movements. Skin:  no rash/petichiae Vascular:  Normal pulses all extremities  Neurologic Exam Mental Status: Awake and fully alert. Oriented to place and time. Recent and remote memory intact. Attention span, concentration and fund of knowledge appropriate. Mood and affect appropriate.  Cranial Nerves: Fundoscopic exam difficult through undilated pupil.. Pupils equal, briskly reactive to light. Extraocular movements full without nystagmus. Visual fields full to confrontation. Hearing mildly diminished bilaterally. Facial sensation intact. Face, tongue, palate moves normally and symmetrically.  Motor: Normal bulk and tone. Normal strength in all tested extremity muscles. Sensory.: intact to touch , pinprick , position and vibratory sensation.  Coordination: Rapid alternating movements normal in all extremities. Finger-to-nose and heel-to-shin performed accurately bilaterally. Gait and Station: Arises from chair with mild difficulty. Stance is slightly stooped.  Uses a cane.. Gait demonstrates normal stride length and balance .  Not able to heel, toe and tandem walk without  difficulty.  Reflexes: 1+ and symmetric. Toes downgoing.       ASSESSMENT: 87 year old African-American lady with chronic daily headaches for the last couple of years likely combination of muscle tension headaches with analgesic rebound which appear to have improved a lot on Topamax.     PLAN: I had a long discussion with the patient regarding her headache which appears to have shown some improvement on Topamax.  I recommend we increase the dose to 50 mg twice daily.  I recommend she avoid sudden neck movements when looking down and do regular neck stretching exercises.  She will return for follow-up in the future in 3 months with my nurse practitioner Janett Billow or call earlier if necessary. Greater than 50% time during this 25-minute consultation visit was spent on counseling and coordination of care about her chronic daily headaches and answering questions Antony Contras, MD  [facility] [address] [place] [address], [place] [address]  Phone [phone] Fax [phone] Note: This document was prepared with digital dictation and possible smart phrase technology. Any transcriptional errors that result from this process are unintentional.

## 2020-11-06 ENCOUNTER — Other Ambulatory Visit: Payer: Self-pay

## 2020-11-06 ENCOUNTER — Ambulatory Visit: Payer: Medicare Other | Admitting: Orthopaedic Surgery

## 2020-12-17 ENCOUNTER — Ambulatory Visit: Payer: No Typology Code available for payment source | Admitting: Orthopaedic Surgery

## 2020-12-18 ENCOUNTER — Ambulatory Visit: Payer: No Typology Code available for payment source | Admitting: Orthopaedic Surgery

## 2020-12-18 ENCOUNTER — Other Ambulatory Visit: Payer: Self-pay | Admitting: Orthopaedic Surgery

## 2020-12-18 ENCOUNTER — Other Ambulatory Visit: Payer: Self-pay

## 2020-12-19 ENCOUNTER — Telehealth: Payer: Self-pay | Admitting: Orthopaedic Surgery

## 2020-12-19 NOTE — Telephone Encounter (Signed)
I called the patient back and she is aware she can't get a refill until she has been seen.  She is scheduled for 01/15/21 in [place].    She said she miss her daughter she stayed on top of things for her.

## 2021-01-15 ENCOUNTER — Ambulatory Visit (INDEPENDENT_AMBULATORY_CARE_PROVIDER_SITE_OTHER): Payer: Medicare Other | Admitting: Orthopaedic Surgery

## 2021-01-15 ENCOUNTER — Encounter: Payer: Self-pay | Admitting: Orthopaedic Surgery

## 2021-01-15 DIAGNOSIS — G8929 Other chronic pain: Secondary | ICD-10-CM

## 2021-01-15 DIAGNOSIS — M5441 Lumbago with sciatica, right side: Secondary | ICD-10-CM

## 2021-01-15 MED ORDER — HYDROCODONE-ACETAMINOPHEN 7.5-325 MG PO TABS: 1.0000 | ORAL_TABLET | Freq: Four times a day (QID) | ORAL | 0 refills | Status: DC | PRN

## 2021-01-15 NOTE — Progress Notes (Signed)
I am still alive.  She is 89 now.  Her daughter who took care of her died this past year and she has to depend on others to get to office.  She missed two appointments because of transportation problems.  She is having some right hip pain but walking well without cane today.  She has no new trauma. She has some lower back pain but no weakness, no trauma.  Right hip is tender, ROM flexion 90, 20 internal, 15 external, NV intact.  Spine/Pelvis examination:  Inspection:  Overall, sacoiliac joint benign and hips nontender; without crepitus or defects.   Thoracic spine inspection: Alignment normal without kyphosis present   Lumbar spine inspection:  Alignment  with normal lumbar lordosis, without scoliosis apparent.   Thoracic spine palpation:  without tenderness of spinal processes   Lumbar spine palpation: without tenderness of lumbar area; without tightness of lumbar muscles    Range of Motion:   Lumbar flexion, forward flexion is normal without pain or tenderness    Lumbar extension is full without pain or tenderness   Left lateral bend is normal without pain or tenderness   Right lateral bend is normal without pain or tenderness   Straight leg raising is normal  Strength & tone: normal   Stability overall normal stability  Encounter Diagnosis  Name Primary?   Chronic right-sided low back pain with right-sided sciatica Yes   I will refill her pain medicine.  I have reviewed the [address] web site prior to prescribing narcotic medicine for this patient.  Return in three months.  Call if any problem.  Precautions discussed.  Electronically Signed Sanjuana Kava, MD 9/28/20229:27 AM

## 2021-01-20 ENCOUNTER — Ambulatory Visit: Payer: Medicare Other | Admitting: Adult Health

## 2021-02-06 ENCOUNTER — Telehealth: Payer: Self-pay | Admitting: Neurology

## 2021-02-06 NOTE — Telephone Encounter (Signed)
Linda Richardson has called stating she is returning a call back to [place], Therapist, sports. Linda Richardson can be reached on her work#[phone]

## 2021-02-06 NOTE — Telephone Encounter (Signed)
I did not call this patient. She has a pending appt w/ Jessica on 02/12/21. She received a reminder call from our office. Mateo Flow called again after the message was sent. Lattie Haw in the phone room notified her of the appt.

## 2021-02-12 ENCOUNTER — Encounter: Payer: Self-pay | Admitting: Adult Health

## 2021-02-12 ENCOUNTER — Ambulatory Visit (INDEPENDENT_AMBULATORY_CARE_PROVIDER_SITE_OTHER): Payer: No Typology Code available for payment source | Admitting: Adult Health

## 2021-02-12 VITALS — BP 154/80 | HR 79 | Ht 61.0 in | Wt 162.0 lb

## 2021-02-12 DIAGNOSIS — R519 Headache, unspecified: Secondary | ICD-10-CM

## 2021-02-12 MED ORDER — TOPIRAMATE 50 MG PO TABS: 75.0000 mg | ORAL_TABLET | Freq: Two times a day (BID) | ORAL | 5 refills | Status: DC

## 2021-02-12 NOTE — Patient Instructions (Signed)
Increase topamax to 75mg  (1.5 tabs) twice per day  If after 2 weeks no benefit, please let me know and we can consider further increasing  Any difficulty tolerating, please let me know     Followup in the future with me in 6 months or call earlier if needed       Thank you for coming to see [place] at [facility]. I hope we have been able to provide you high quality care today.  You may receive a patient satisfaction survey over the next few weeks. We would appreciate your feedback and comments so that we may continue to improve ourselves and the health of our patients.

## 2021-02-12 NOTE — Progress Notes (Signed)
[facility] [address] [place]. [place] [address] [phone]       OFFICE FOLLOW UP VISIT NOTE  Linda Richardson Date of Birth:  06-06-1923 Medical Record Number:  [id number]   Referring MD: Standley Brooking   PA-C  Reason for Referral: Headache  Chief Complaint  Patient presents with   Follow-up    RM 3 alone  Pt is well, headaches have slightly improved since last visit.       HPI:   Update 02/12/2021 JM: Returns for headache follow-up visit after prior visit with Dr. Leonie Man 4 months ago.  Doing well since prior visit.  Remains on topiramate 50 mg twice daily - denies side effects. Does report improvement of headache severity since dosage increase at her prior visit but continues to experience almost daily headaches.  Worsened headaches with bending head forward (coming from neck - known chronic neck pain) or with eye strain (reading or concentration). Plans on scheduling visit with ophthalmology- right eye has been watering more.  She does have eyedrops for dry eye  but does not use on regular basis.  Routinely followed by Dr. Luna Glasgow for chronic pain.  No new concerns at this time.    History provided for reference purposes only Update 10/17/2020 Dr. Leonie Man: She returns for for follow-up after last visit 3 months ago.  She states her headaches are much better.  Headaches are no longer daily and are only a few times a week related to her neck movements mostly.  She is tolerating Topamax 25 mg twice daily but she still has some headaches and is wondering if she can increase the dose.  Headaches are now mostly triggered if she bends her neck down or is trying to pick things up.  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  MRI scan cervical spine showed degenerative changes from C3-C6 with mild  spinal stenosis at C3-4 and mild right-sided foraminal narrowing at C4-5 with moderate to severe right-sided foraminal narrowing at C5-6.  I discussed the findings with the patient the patient is not keen to undergo any surgical treatment at her age.  She has not been doing any neck stretching exercises.  Had ordered some lab work at last visit with patient for unclear reason did not do it.  Initial visit 07/11/2020 Dr. Leonie Man: Linda Richardson is a pleasant 87 year old African-American lady seen today for initial office consultation visit for headache.  She is accompanied by her neighbor who is also a her caretaker.  History is obtained from them, review of referral notes and electronic medical records.  Reviewed pertinent available imaging films in PACS.  She has past medical history of hypertension, hyperlipidemia, hypothyroidism, glaucoma, diabetes and bladder cancer.  She states she has had no prior history of migraines or headaches but started getting headaches the last 2 years or so.  Probably of year and a half.  Last several months the headaches have progressed and now occur on a daily basis.  She describes the headache is been mostly in the left temporal regions.  At times that headache is described as being burning at times like a pulling sensation.  She does complain of significant neck pain and spasm and restriction of neck movements as well.  She had a fall and hurt her hip about 3 years ago since then she  has been on narcotics which help not only her hip pain but also headaches.  She in addition to 2 tablets of Norco per day she also takes couple of tablets of over-the-counter analgesics as well but they do not help her headache as well.  She denies any visual symptoms with the headaches, nausea, vomiting or focal neurological symptoms.  She denies symptoms of any scalp tenderness, jaw claudication or myalgias.  She has not had any vision loss with the headaches.  She has no prior history of migraine  headaches.  There is no prior history of strokes, TIA, seizures, close head injury with loss of consciousness.  She is able to ambulate with a cane due to her hip pain.  She did undergo CT scan of the head without contrast on 02/28/2020 which showed mild age-related generalized atrophy and microvascular changes.  No acute abnormality.  Review of electronic medical records did not show any recent lab work.     ROS:   14 system review of systems is positive for those listed in HPI and all other systems negative  PMH:  Past Medical History:  Diagnosis Date   Anxiety    Bladder cancer ([place])    Diabetes ([place])    Dysuria-frequency syndrome    Glaucoma    2006   Headache    Hyperlipidemia    Hypertension    Hypothyroidism    Obesity     Social History:  Social History   Socioeconomic History   Marital status: Widowed    Spouse name: Not on file   Number of children: 3   Years of education: Not on file   Highest education level: Not on file  Occupational History    Comment: retired  Tobacco Use   Smoking status: Former   Smokeless tobacco: Never  Substance and Sexual Activity   Alcohol use: No   Drug use: No   Sexual activity: Not Currently  Other Topics Concern   Not on file  Social History Narrative   07/11/20 lives alone   Social Determinants of Health   Financial Resource Strain: Not on file  Food Insecurity: Not on file  Transportation Needs: Not on file  Physical Activity: Not on file  Stress: Not on file  Social Connections: Not on file  Intimate Partner Violence: Not on file    Medications:   Current Outpatient Medications on File Prior to Visit  Medication Sig Dispense Refill   amLODipine (NORVASC) 5 MG tablet Take 1 tablet (5 mg total) by mouth daily. 30 tablet 0   aspirin 81 MG tablet Take 81 mg by mouth daily.     brimonidine (ALPHAGAN P) 0.1 % SOLN Place 1 drop into both eyes 2 (two) times daily. One drop each eye twice daily as needed      citalopram (CELEXA) 20 MG tablet TAKE (1) TABLET BY MOUTH ONCE DAILY. 30 tablet 0   furosemide (LASIX) 40 MG tablet      gabapentin (NEURONTIN) 300 MG capsule 300 mg daily.     HYDROcodone-acetaminophen (NORCO) 7.5-325 MG tablet Take 1 tablet by mouth every 6 (six) hours as needed for moderate pain. 30 days 60 tablet 0   ibuprofen (ADVIL) 600 MG tablet Take 1 tablet (600 mg total) by mouth every 8 (eight) hours as needed. 100 tablet 5   levothyroxine (SYNTHROID) 50 MCG tablet Take 50 mcg by mouth daily.     lisinopril (ZESTRIL) 20 MG tablet Take 20 mg by mouth daily.  loteprednol (LOTEMAX) 0.5 % ophthalmic suspension Place 1 drop into the left eye 4 (four) times daily.     Multiple Vitamin (MULTIVITAMIN) tablet Take 1 tablet by mouth daily.     potassium chloride (MICRO-K) 10 MEQ CR capsule      pravastatin (PRAVACHOL) 20 MG tablet Take 1 tablet (20 mg total) by mouth daily. 30 tablet 0   tiZANidine (ZANAFLEX) 4 MG tablet Take 4 mg by mouth at bedtime.     vitamin B-12 (CYANOCOBALAMIN) 1000 MCG tablet Take 1,000 mcg by mouth daily.     calcium-vitamin D (OSCAL 500/200 D-3) 500-200 MG-UNIT per tablet Take 1 tablet by mouth 2 (two) times daily. 180 tablet 3   No current facility-administered medications on file prior to visit.    Allergies:   Allergies  Allergen Reactions   Avapro [Irbesartan]     Dry cough   Penicillins    Sulfonamide Derivatives     Physical Exam Today's Vitals   02/12/21 0944  BP: (!) 154/80  Pulse: 79  Weight: 162 lb (73.5 kg)  Height: 5\' 1"  (1.549 m)   Body mass index is 30.61 kg/m.   General: Very pleasant elderly African-American lady, seated, in no evident distress Head: head normocephalic and atraumatic.   Neck: supple with no carotid or supraclavicular bruits Cardiovascular: regular rate and rhythm, no murmurs Musculoskeletal: no deformity but significant neck posterior muscle spasm with restriction of movements. Skin:  no  rash/petichiae Vascular:  Normal pulses all extremities  Neurologic Exam Mental Status: Awake and fully alert. Oriented to place and time. Recent and remote memory intact. Attention span, concentration and fund of knowledge appropriate. Mood and affect appropriate.  Cranial Nerves: Pupils equal, briskly reactive to light. Extraocular movements full without nystagmus. Visual fields full to confrontation. Hearing mildly diminished bilaterally. Facial sensation intact. Face, tongue, palate moves normally and symmetrically.  Motor: Normal bulk and tone. Normal strength in all tested extremity muscles. Sensory.: intact to touch , pinprick , position and vibratory sensation.  Coordination: Rapid alternating movements normal in all extremities. Finger-to-nose and heel-to-shin performed accurately bilaterally. Gait and Station: Arises from chair with mild difficulty. Stance is slightly stooped.  Uses a cane.. Gait demonstrates normal stride length and balance .  Not able to heel, toe and tandem walk without difficulty.  Reflexes: 1+ and symmetric. Toes downgoing.       ASSESSMENT: 87 year old African-American lady with chronic daily headaches for the last couple of years likely combination of muscle tension headaches with analgesic rebound with severity improvement on Topamax but continued daily headaches.     PLAN:  -increase topamax 75mg  twice daily due to persistent daily headaches -advised to call after 2 weeks if no benefit -advised to call with any side effects or medication concerns -advised to f/u with ophthalmology   Follow up in 6 months or call earlier if needed    CC:  Medicine, [facility]   I spent 26 minutes of face-to-face and non-face-to-face time with patient.  This included previsit chart review, lab review, study review, order entry, electronic health record documentation, patient education and discussion regarding chronic daily headaches and ongoing use of  topiramate, conservative headache prevention measures and answered all the questions to patient's satisfaction  Frann Rider, AGNP-BC  [facility] [address] [place], [place] [address]  Phone [phone] Fax [phone] Note: This document was prepared with digital dictation and possible smart phrase technology. Any transcriptional errors that result from this process are unintentional.

## 2021-04-09 ENCOUNTER — Encounter: Payer: Self-pay | Admitting: Orthopaedic Surgery

## 2021-04-09 ENCOUNTER — Ambulatory Visit (INDEPENDENT_AMBULATORY_CARE_PROVIDER_SITE_OTHER): Payer: No Typology Code available for payment source | Admitting: Orthopaedic Surgery

## 2021-04-09 VITALS — Ht 61.0 in | Wt 163.1 lb

## 2021-04-09 DIAGNOSIS — M5441 Lumbago with sciatica, right side: Secondary | ICD-10-CM | POA: Diagnosis not present

## 2021-04-09 DIAGNOSIS — G8929 Other chronic pain: Secondary | ICD-10-CM

## 2021-04-09 MED ORDER — HYDROCODONE-ACETAMINOPHEN 7.5-325 MG PO TABS: 1.0000 | ORAL_TABLET | Freq: Four times a day (QID) | ORAL | 0 refills | Status: DC | PRN

## 2021-04-09 NOTE — Progress Notes (Signed)
I hurt more  She has had more back pain over the last few weeks.  The cold weather has made it worse.  She has more pain in the early mornings.  She has no trauma, no falls, no weakness.  She is very active.  Spine/Pelvis examination:  Inspection:  Overall, sacoiliac joint benign and hips nontender; without crepitus or defects.   Thoracic spine inspection: Alignment normal without kyphosis present   Lumbar spine inspection:  Alignment  with normal lumbar lordosis, without scoliosis apparent.   Thoracic spine palpation:  without tenderness of spinal processes   Lumbar spine palpation: without tenderness of lumbar area; without tightness of lumbar muscles    Range of Motion:   Lumbar flexion, forward flexion is normal without pain or tenderness    Lumbar extension is full without pain or tenderness   Left lateral bend is normal without pain or tenderness   Right lateral bend is normal without pain or tenderness   Straight leg raising is normal  Strength & tone: normal   Stability overall normal stability  Encounter Diagnosis  Name Primary?   Chronic right-sided low back pain with right-sided sciatica Yes   I have reviewed the [place] web site prior to prescribing narcotic medicine for this patient.  I have gone over some exercises with her.  She is very active for a 87 year old.  Return in three months.  Call if any problem.  Precautions discussed.  Electronically Signed Sanjuana Kava, MD 12/21/202210:25 AM

## 2021-07-02 ENCOUNTER — Ambulatory Visit (INDEPENDENT_AMBULATORY_CARE_PROVIDER_SITE_OTHER): Payer: No Typology Code available for payment source | Admitting: Orthopaedic Surgery

## 2021-07-02 ENCOUNTER — Other Ambulatory Visit: Payer: Self-pay

## 2021-07-02 ENCOUNTER — Encounter: Payer: Self-pay | Admitting: Orthopaedic Surgery

## 2021-07-02 VITALS — Ht 65.0 in | Wt 170.4 lb

## 2021-07-02 DIAGNOSIS — M5441 Lumbago with sciatica, right side: Secondary | ICD-10-CM | POA: Diagnosis not present

## 2021-07-02 DIAGNOSIS — G8929 Other chronic pain: Secondary | ICD-10-CM

## 2021-07-02 NOTE — Progress Notes (Signed)
I am doing fine ? ?She has chronic lower back pain but does not complain.  She has no new trauma, no weakness. She has more pain with cold weather.  She is active and does not look her age of 89 at all.   ? ?Spine/Pelvis examination: ? Inspection:  Overall, sacoiliac joint benign and hips nontender; without crepitus or defects. ? ? Thoracic spine inspection: Alignment normal without kyphosis present ? ? Lumbar spine inspection:  Alignment  with normal lumbar lordosis, without scoliosis apparent. ? ? Thoracic spine palpation:  without tenderness of spinal processes ? ? Lumbar spine palpation: without tenderness of lumbar area; without tightness of lumbar muscles  ? ? Range of Motion: ?  Lumbar flexion, forward flexion is normal without pain or tenderness  ?  Lumbar extension is full without pain or tenderness ?  Left lateral bend is normal without pain or tenderness ?  Right lateral bend is normal without pain or tenderness ?  Straight leg raising is normal ? Strength & tone: normal ? ? Stability overall normal stability ? ?Encounter Diagnosis  ?Name Primary?  ? Chronic right-sided low back pain with right-sided sciatica Yes  ? ?Continue activity. ? ?Continue present medicines. ? ?Return in three months. ? ?Call if any problem. ? ?Precautions discussed. ? ?Electronically Signed ?Sanjuana Kava, MD ?3/15/202310:52 AM ? ?

## 2021-08-21 ENCOUNTER — Ambulatory Visit (INDEPENDENT_AMBULATORY_CARE_PROVIDER_SITE_OTHER): Payer: No Typology Code available for payment source | Admitting: Adult Health

## 2021-08-21 ENCOUNTER — Encounter: Payer: Self-pay | Admitting: Adult Health

## 2021-08-21 VITALS — BP 182/87 | HR 78 | Ht 65.0 in | Wt 166.0 lb

## 2021-08-21 DIAGNOSIS — R519 Headache, unspecified: Secondary | ICD-10-CM | POA: Diagnosis not present

## 2021-08-21 MED ORDER — TOPIRAMATE 100 MG PO TABS: 100.0000 mg | ORAL_TABLET | Freq: Two times a day (BID) | ORAL | 5 refills | Status: AC

## 2021-08-21 NOTE — Progress Notes (Signed)
?[facility] ?[address] ?[place]. [place] [address] ?[phone] ? ?     OFFICE FOLLOW UP VISIT NOTE ? ?Ms. Linda Richardson ?Date of Birth:  07-28-1923 ?Medical Record Number:  [id number]  ? ?Referring MD: Linda Ports Tria   PA-C ? ?Reason for Referral: Headache ? ?Chief Complaint  ?Patient presents with  ? Follow-up  ?  Rm 3 alone ?Pt is well, states headaches have worsen to daily   ?  ? ? ?HPI:  ? ?Update 08/21/2021 JM: Patient returns for 36-monthfollow-up regarding headaches. Does report continued headaches with band type sensation. She is concerned of "bumps" on her head right here her pain is located.  Currently on topiramate 75 mg twice daily, denies side effects. Blood pressure significantly elevated today at 182/87 and similar on recheck, does not routinely monitor at home. Continues to follow with pain management. No further concerns at this time.  ? ? ? ? ?History provided for reference purposes only ?Update 02/12/2021 JM: Returns for headache follow-up visit after prior visit with Dr. SLeonie Man4 months ago.  Doing well since prior visit.  Remains on topiramate 50 mg twice daily - denies side effects. Does report improvement of headache severity since dosage increase at her prior visit but continues to experience almost daily headaches.  Worsened headaches with bending head forward (coming from neck - known chronic neck pain) or with eye strain (reading or concentration). Plans on scheduling visit with ophthalmology- right eye has been watering more.  She does have eyedrops for dry eye  but does not use on regular basis.  Routinely followed by Dr. KLuna Glasgowfor chronic pain.  No new concerns at this time. ? ?Update 10/17/2020 Linda Richardson She returns for for follow-up after last visit 3 months ago.  She states her headaches are much better.  Headaches are no longer daily and are only a few times a week related to her neck movements mostly.  She is tolerating Topamax 25 mg twice daily but  she still has some headaches and is wondering if she can increase the dose.  Headaches are now mostly triggered if she bends her neck down or is trying to pick things up.  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  MRI scan cervical spine showed degenerative changes from C3-C6 with mild spinal stenosis at C3-4 and mild right-sided foraminal narrowing at C4-5 with moderate to severe right-sided foraminal narrowing at C5-6.  I discussed the findings with the patient the patient is not keen to undergo any surgical treatment at her age.  She has not been doing any neck stretching exercises.  Had ordered some lab work at last visit with patient for unclear reason did not do it. ? ?Initial visit 07/11/2020 Dr. SLeonie Man Ms. CCyndy Freezeis a pleasant 88year old African-American lady seen today for initial office consultation visit for headache.  She is accompanied by her neighbor who is also a her caretaker.  History is obtained from them, review of referral notes and electronic medical records.  Reviewed pertinent available imaging films in PACS.  She has past medical history of hypertension, hyperlipidemia, hypothyroidism, glaucoma, diabetes and bladder cancer.  She states she has had no prior history of migraines or headaches but started getting headaches the last 2 years or so.  Probably of year and a half.  Last several months  the headaches have progressed and now occur on a daily basis.  She describes the headache is been mostly in the left temporal regions.  At times that headache is described as being burning at times like a pulling sensation.  She does complain of significant neck pain and spasm and restriction of neck movements as well.  She had a fall and hurt her hip about 3 years ago since then she has been on narcotics which help not only her hip pain  but also headaches.  She in addition to 2 tablets of Norco per day she also takes couple of tablets of over-the-counter analgesics as well but they do not help her headache as well.  She denies any visual symptoms with the headaches, nausea, vomiting or focal neurological symptoms.  She denies symptoms of any scalp tenderness, jaw claudication or myalgias.  She has not had any vision loss with the headaches.  She has no prior history of migraine headaches.  There is no prior history of strokes, TIA, seizures, close head injury with loss of consciousness.  She is able to ambulate with a cane due to her hip pain.  She did undergo CT scan of the head without contrast on 02/28/2020 which showed mild age-related generalized atrophy and microvascular changes.  No acute abnormality.  Review of electronic medical records did not show any recent lab work. ? ? ? ? ?ROS:   ?14 system review of systems is positive for those listed in HPI and all other systems negative ? ?PMH:  ?Past Medical History:  ?Diagnosis Date  ? Anxiety   ? Bladder cancer ([place])   ? Diabetes ([place])   ? Dysuria-frequency syndrome   ? Glaucoma   ? 2006  ? Headache   ? Hyperlipidemia   ? Hypertension   ? Hypothyroidism   ? Obesity   ? ? ?Social History:  ?Social History  ? ?Socioeconomic History  ? Marital status: Widowed  ?  Spouse name: Not on file  ? Number of children: 3  ? Years of education: Not on file  ? Highest education level: Not on file  ?Occupational History  ?  Comment: retired  ?Tobacco Use  ? Smoking status: Former  ? Smokeless tobacco: Never  ?Substance and Sexual Activity  ? Alcohol use: No  ? Drug use: No  ? Sexual activity: Not Currently  ?Other Topics Concern  ? Not on file  ?Social History Narrative  ? 07/11/20 lives alone  ? ?Social Determinants of Health  ? ?Financial Resource Strain: Not on file  ?Food Insecurity: Not on file  ?Transportation Needs: Not on file  ?Physical Activity: Not on file  ?Stress: Not on file  ?Social  Connections: Not on file  ?Intimate Partner Violence: Not on file  ? ? ?Medications:   ?Current Outpatient Medications on File Prior to Visit  ?Medication Sig Dispense Refill  ? allopurinol (ZYLOPRIM) 300 MG tablet Take 300 mg by mouth daily.    ? amLODipine (NORVASC) 5 MG tablet Take 1 tablet (5 mg total) by mouth daily. 30 tablet 0  ? aspirin 81 MG tablet Take 81 mg by mouth daily.    ? brimonidine (ALPHAGAN P) 0.1 % SOLN Place 1 drop into both eyes 2 (two) times daily. One drop each eye twice daily as needed    ? calcium-vitamin D (OSCAL 500/200 D-3) 500-200 MG-UNIT per tablet Take 1 tablet by mouth 2 (two) times daily. 180 tablet 3  ? citalopram (CELEXA) 20 MG tablet TAKE (  1) TABLET BY MOUTH ONCE DAILY. 30 tablet 0  ? furosemide (LASIX) 40 MG tablet     ? gabapentin (NEURONTIN) 300 MG capsule 300 mg daily.    ? HYDROcodone-acetaminophen (NORCO) 7.5-325 MG tablet Take 1 tablet by mouth every 6 (six) hours as needed for moderate pain. 30 days 60 tablet 0  ? ibuprofen (ADVIL) 600 MG tablet Take 1 tablet (600 mg total) by mouth every 8 (eight) hours as needed. 100 tablet 5  ? levothyroxine (SYNTHROID) 50 MCG tablet Take 50 mcg by mouth daily.    ? lisinopril (ZESTRIL) 20 MG tablet Take 20 mg by mouth daily.    ? loteprednol (LOTEMAX) 0.5 % ophthalmic suspension Place 1 drop into the left eye 4 (four) times daily.    ? Multiple Vitamin (MULTIVITAMIN) tablet Take 1 tablet by mouth daily.    ? potassium chloride (MICRO-K) 10 MEQ CR capsule     ? pravastatin (PRAVACHOL) 20 MG tablet Take 1 tablet (20 mg total) by mouth daily. 30 tablet 0  ? tiZANidine (ZANAFLEX) 4 MG tablet Take 4 mg by mouth at bedtime.    ? topiramate (TOPAMAX) 50 MG tablet Take 1.5 tablets (75 mg total) by mouth 2 (two) times daily. 90 tablet 5  ? vitamin B-12 (CYANOCOBALAMIN) 1000 MCG tablet Take 1,000 mcg by mouth daily.    ? ?No current facility-administered medications on file prior to visit.  ? ? ?Allergies:   ?Allergies  ?Allergen Reactions  ?  Avapro [Irbesartan]   ?  Dry cough  ? Penicillins   ? Sulfonamide Derivatives   ? ? ?Physical Exam ?Today's Vitals  ? 08/21/21 0937  ?BP: (!) 182/87  ?Pulse: 78  ?Weight: 166 lb (75.3 kg)  ?Height: '5\' 5"'$  (1.651 m)

## 2021-08-21 NOTE — Patient Instructions (Addendum)
Increase topamax to '100mg'$  twice daily  ? ?Please let me know if headaches should worsen further ? ?Unsure what your bumps on your head are - if this continue to be bothersome, would recommend following up with your primary doctor ? ? ? ?Follow up in 6 months or call earlier if needed ?

## 2021-10-08 ENCOUNTER — Encounter: Payer: Self-pay | Admitting: Orthopaedic Surgery

## 2021-10-08 ENCOUNTER — Ambulatory Visit (INDEPENDENT_AMBULATORY_CARE_PROVIDER_SITE_OTHER): Payer: No Typology Code available for payment source | Admitting: Orthopaedic Surgery

## 2021-10-08 DIAGNOSIS — G8929 Other chronic pain: Secondary | ICD-10-CM | POA: Diagnosis not present

## 2021-10-08 DIAGNOSIS — M5441 Lumbago with sciatica, right side: Secondary | ICD-10-CM

## 2021-10-08 MED ORDER — HYDROCODONE-ACETAMINOPHEN 7.5-325 MG PO TABS: 1.0000 | ORAL_TABLET | Freq: Four times a day (QID) | ORAL | 0 refills | Status: DC | PRN

## 2021-10-08 NOTE — Progress Notes (Signed)
My back is sore sometimes.  She has good and bad days with her lower back.  Today it is raining and she has some tenderness.  She has no weakness, no paresthesias, no new trauma.  She is very active.  Spine/Pelvis examination:  Inspection:  Overall, sacoiliac joint benign and hips nontender; without crepitus or defects.   Thoracic spine inspection: Alignment normal without kyphosis present   Lumbar spine inspection:  Alignment  with normal lumbar lordosis, without scoliosis apparent.   Thoracic spine palpation:  without tenderness of spinal processes   Lumbar spine palpation: without tenderness of lumbar area; without tightness of lumbar muscles    Range of Motion:   Lumbar flexion, forward flexion is normal without pain or tenderness    Lumbar extension is full without pain or tenderness   Left lateral bend is normal without pain or tenderness   Right lateral bend is normal without pain or tenderness   Straight leg raising is normal  Strength & tone: normal   Stability overall normal stability  Encounter Diagnosis  Name Primary?   Chronic right-sided low back pain with right-sided sciatica Yes   I have reviewed the [place] web site prior to prescribing narcotic medicine for this patient.  Return in three months.  Call if any problem.  Precautions discussed.  Electronically Signed Sanjuana Kava, MD 6/21/202311:09 AM

## 2022-01-07 ENCOUNTER — Ambulatory Visit (INDEPENDENT_AMBULATORY_CARE_PROVIDER_SITE_OTHER): Payer: No Typology Code available for payment source | Admitting: Orthopaedic Surgery

## 2022-01-07 ENCOUNTER — Encounter: Payer: Self-pay | Admitting: Orthopaedic Surgery

## 2022-01-07 VITALS — Ht 65.0 in | Wt 165.0 lb

## 2022-01-07 DIAGNOSIS — M5441 Lumbago with sciatica, right side: Secondary | ICD-10-CM | POA: Diagnosis not present

## 2022-01-07 DIAGNOSIS — G8929 Other chronic pain: Secondary | ICD-10-CM

## 2022-01-07 MED ORDER — HYDROCODONE-ACETAMINOPHEN 7.5-325 MG PO TABS: 1.0000 | ORAL_TABLET | Freq: Four times a day (QID) | ORAL | 0 refills | Status: DC | PRN

## 2022-01-07 NOTE — Progress Notes (Signed)
I am doing OK.  She has chronic back pain.  She had her 89th birthday since the last time I saw her.  She has no weakness, no trauma.  She is very active. She does her exercises.  Spine/Pelvis examination:  Inspection:  Overall, sacoiliac joint benign and hips nontender; without crepitus or defects.   Thoracic spine inspection: Alignment normal without kyphosis present   Lumbar spine inspection:  Alignment  with normal lumbar lordosis, without scoliosis apparent.   Thoracic spine palpation:  without tenderness of spinal processes   Lumbar spine palpation: without tenderness of lumbar area; without tightness of lumbar muscles    Range of Motion:   Lumbar flexion, forward flexion is normal without pain or tenderness    Lumbar extension is full without pain or tenderness   Left lateral bend is normal without pain or tenderness   Right lateral bend is normal without pain or tenderness   Straight leg raising is normal  Strength & tone: normal   Stability overall normal stability  Encounter Diagnosis  Name Primary?   Chronic right-sided low back pain with right-sided sciatica Yes   I have reviewed the [place] web site prior to prescribing narcotic medicine for this patient.  Return in three months.  Call if any problem.  Precautions discussed.  Electronically Signed Sanjuana Kava, MD 9/20/20239:59 AM

## 2022-01-08 ENCOUNTER — Ambulatory Visit: Payer: No Typology Code available for payment source | Admitting: Orthopaedic Surgery

## 2022-04-08 ENCOUNTER — Encounter: Payer: Self-pay | Admitting: Orthopaedic Surgery

## 2022-04-08 ENCOUNTER — Ambulatory Visit (INDEPENDENT_AMBULATORY_CARE_PROVIDER_SITE_OTHER): Payer: No Typology Code available for payment source | Admitting: Orthopaedic Surgery

## 2022-04-08 VITALS — BP 175/70 | HR 63 | Ht 65.0 in | Wt 181.8 lb

## 2022-04-08 DIAGNOSIS — M5441 Lumbago with sciatica, right side: Secondary | ICD-10-CM | POA: Diagnosis not present

## 2022-04-08 DIAGNOSIS — G8929 Other chronic pain: Secondary | ICD-10-CM

## 2022-04-08 MED ORDER — HYDROCODONE-ACETAMINOPHEN 7.5-325 MG PO TABS: 1.0000 | ORAL_TABLET | Freq: Four times a day (QID) | ORAL | 0 refills | Status: AC | PRN

## 2022-04-08 NOTE — Progress Notes (Signed)
I have some pain now and then.  She has chronic lower back pain that waxes and wanes.  She has no new trauma.  She has no weakness.  She uses a cane at times.    She is amazingly spry and definitely does not look her age of 89.  Spine/Pelvis examination:  Inspection:  Overall, sacoiliac joint benign and hips nontender; without crepitus or defects.   Thoracic spine inspection: Alignment normal without kyphosis present   Lumbar spine inspection:  Alignment  with normal lumbar lordosis, without scoliosis apparent.   Thoracic spine palpation:  without tenderness of spinal processes   Lumbar spine palpation: without tenderness of lumbar area; without tightness of lumbar muscles    Range of Motion:   Lumbar flexion, forward flexion is normal without pain or tenderness    Lumbar extension is full without pain or tenderness   Left lateral bend is normal without pain or tenderness   Right lateral bend is normal without pain or tenderness   Straight leg raising is normal  Strength & tone: normal   Stability overall normal stability  Encounter Diagnosis  Name Primary?   Chronic right-sided low back pain with right-sided sciatica Yes   I have reviewed the [place] web site prior to prescribing narcotic medicine for this patient.  Return in three months.  Call if any problem.  Precautions discussed.  Electronically Signed Sanjuana Kava, MD 12/20/20239:57 AM

## 2022-06-19 DEATH — deceased

## 2022-07-02 ENCOUNTER — Ambulatory Visit: Payer: No Typology Code available for payment source | Admitting: Orthopaedic Surgery

## 2023-01-20 IMAGING — CT CT HEAD W/O CM
1 series · 15 of 30 positions shown, 19 images · non-contrast
Comparison: Brain MRI 07/29/2020.  Head CT 02/28/2020.

CLINICAL DATA: [AGE] female with dizziness. Headaches since
[REDACTED]. Reportedly there is a left side scalp mass.

EXAM:
CT HEAD WITHOUT CONTRAST
TECHNIQUE: Contiguous axial images were obtained from the base of the skull
through the vertex without intravenous contrast.

[Series 2: head w/(date) · axial · 0.49mm/px · z∈[-141,+9]mm · 15 of 34 slices shown, 19 images]
[im 2/34  brain]
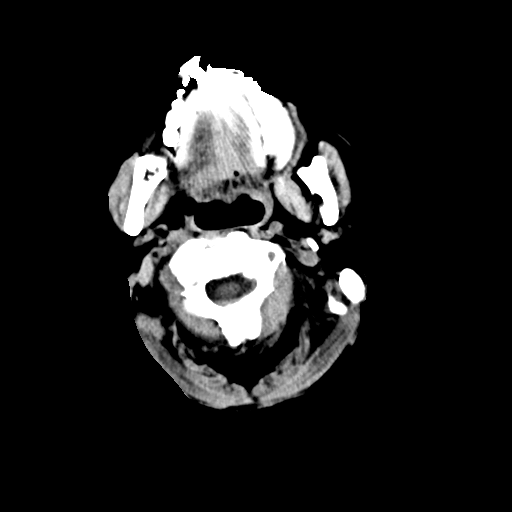
[im 2/34  bone]
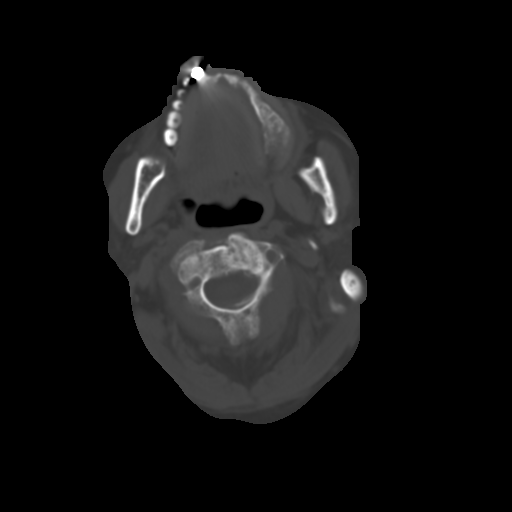
[im 4/34  brain]
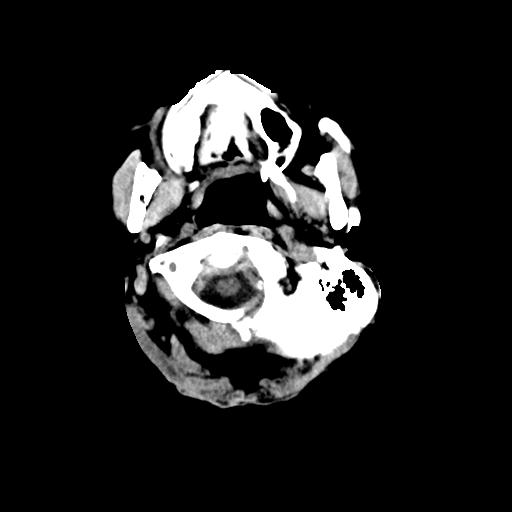
[im 6/34  brain]
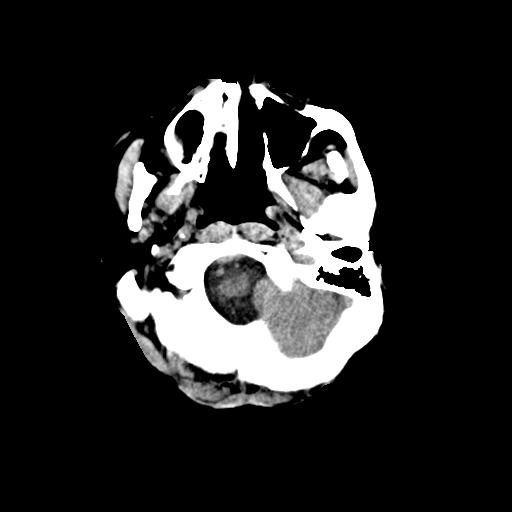
[im 8/34  brain]
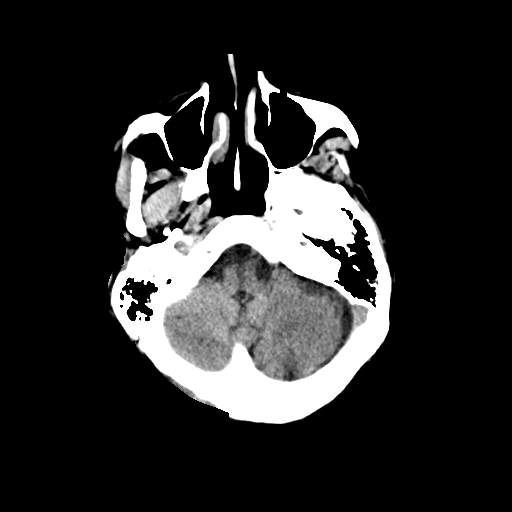
[im 11/34  brain]
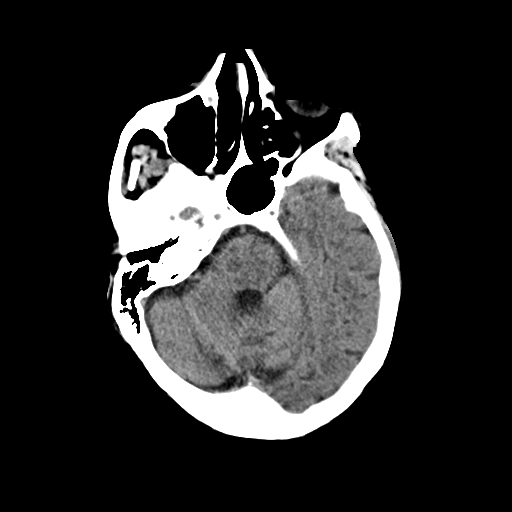
[im 11/34  bone]
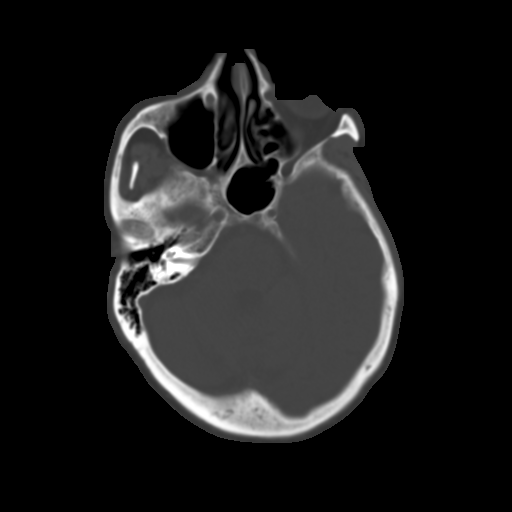
[im 13/34  brain]
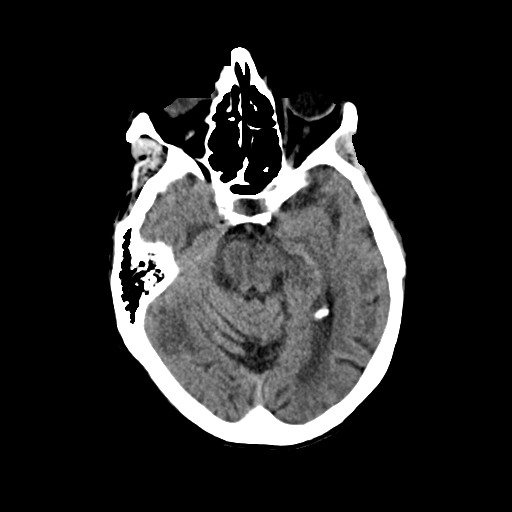
[im 15/34  brain]
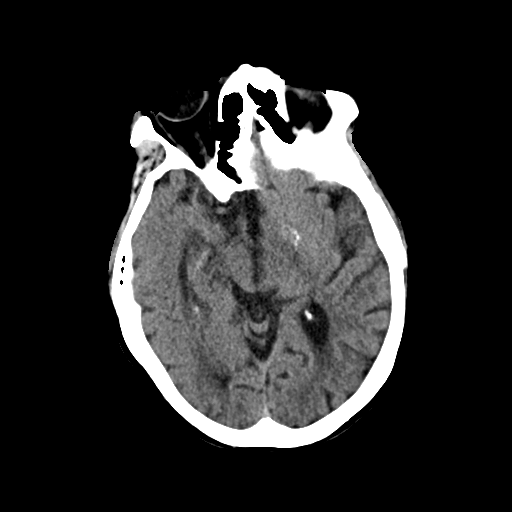
[im 18/34  brain]
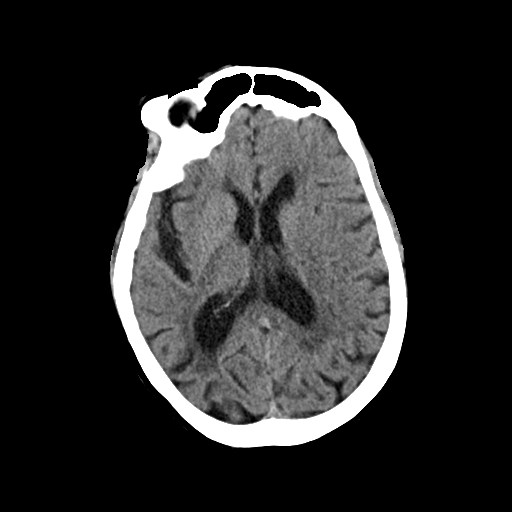
[im 19/34  brain]
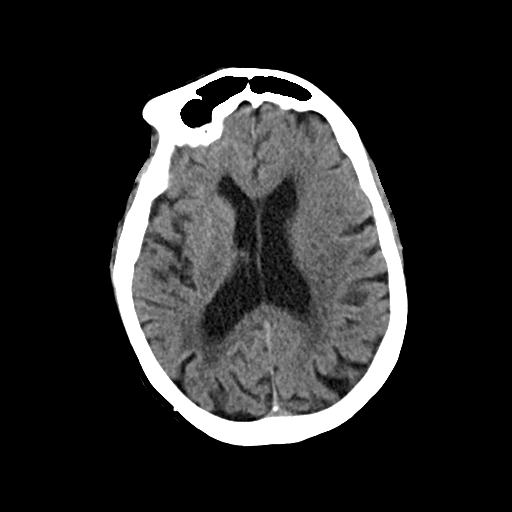
[im 19/34  bone]
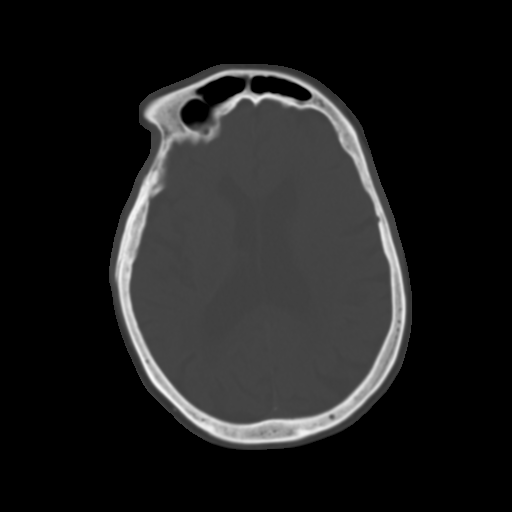
[im 21/34  brain]
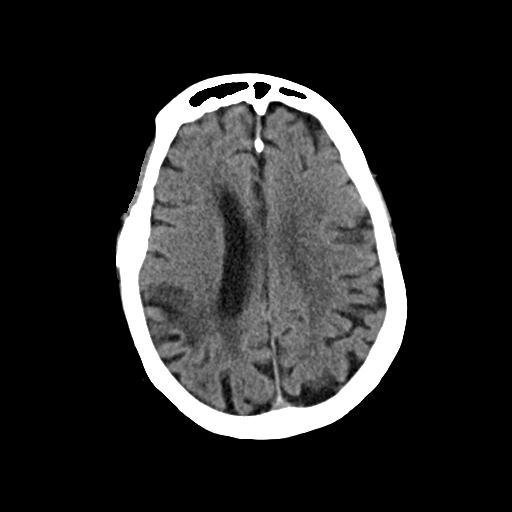
[im 23/34  brain]
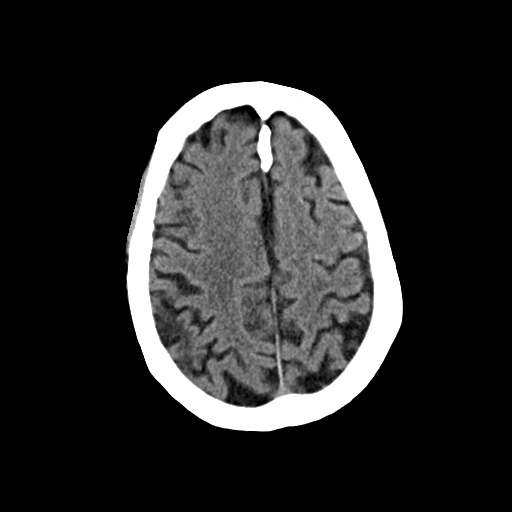
[im 26/34  brain]
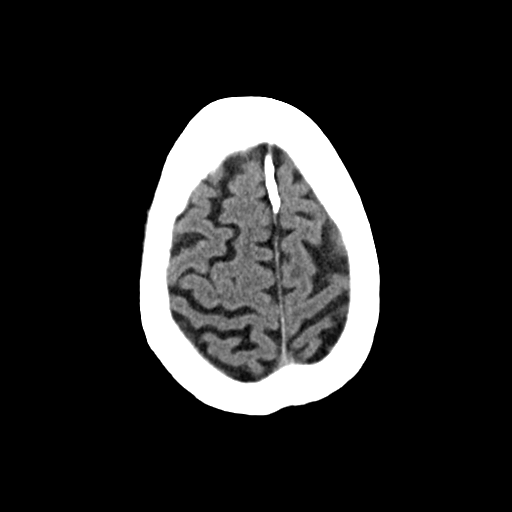
[im 28/34  brain]
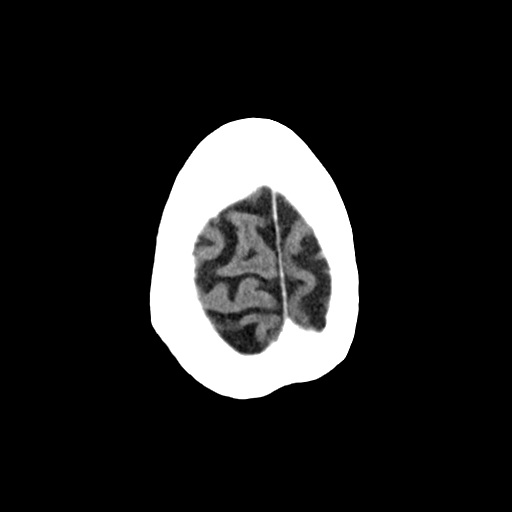
[im 28/34  bone]
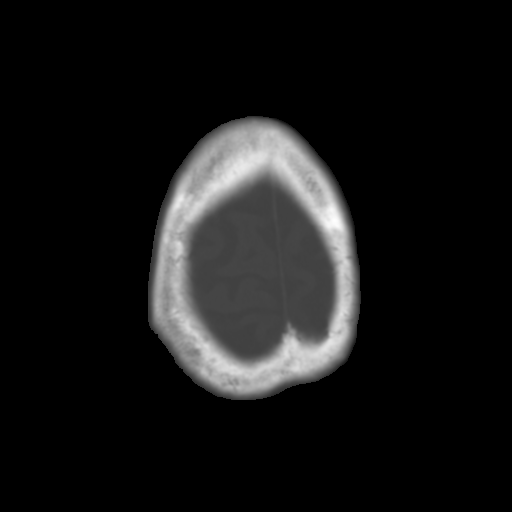
[im 30/34  brain]
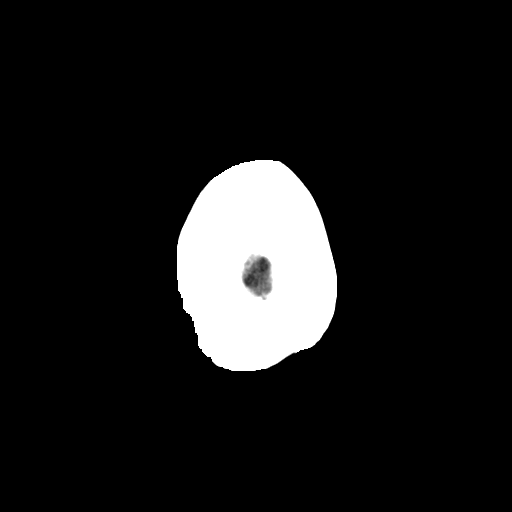
[im 32/34  brain]
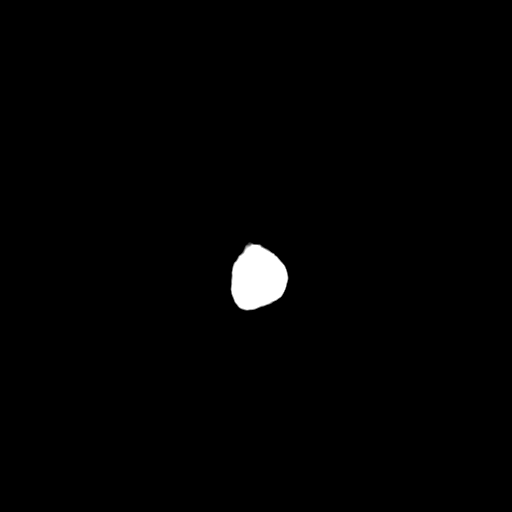

[15 of 30 positions shown; findings below may reference images not displayed]

FINDINGS: Brain: Chronic encephalomalacia in the posterior right MCA territory
is stable from the MRI last year. Stable cerebral volume.

No midline shift, ventriculomegaly, mass effect, evidence of mass
lesion, intracranial hemorrhage or evidence of cortically based
acute infarction. Stable gray-white matter differentiation outside
of the right MCA territory, within normal limits for age.

Vascular: Calcified atherosclerosis at the skull base. No suspicious
intracranial vascular hyperdensity.

Skull: Chronic left TMJ degeneration. Partially visible advanced
upper cervical spine degeneration. No acute osseous abnormality
identified.

Sinuses/Orbits: Minimal new bubbly opacity in a posterior right
ethmoid air cell. Other Visualized paranasal sinuses and mastoids
are stable and well aerated.

Other: No scalp soft tissue mass or abnormality identified. Stable
postoperative appearance of the orbits soft tissues.
IMPRESSION: 1. Stable non contrast CT appearance of the brain since last year.
Chronic posterior right MCA territory infarct.
2. No acute intracranial abnormality.  No scalp mass identified.

## 2023-04-02 IMAGING — DX DG CHEST 1V
1 series · 1 of 1 positions shown · non-contrast
Comparison: Chest radiograph dated 11/14/2012.

CLINICAL DATA: [AGE] female with lower extremity edema.

EXAM:
CHEST  1 VIEW

[chest pa]
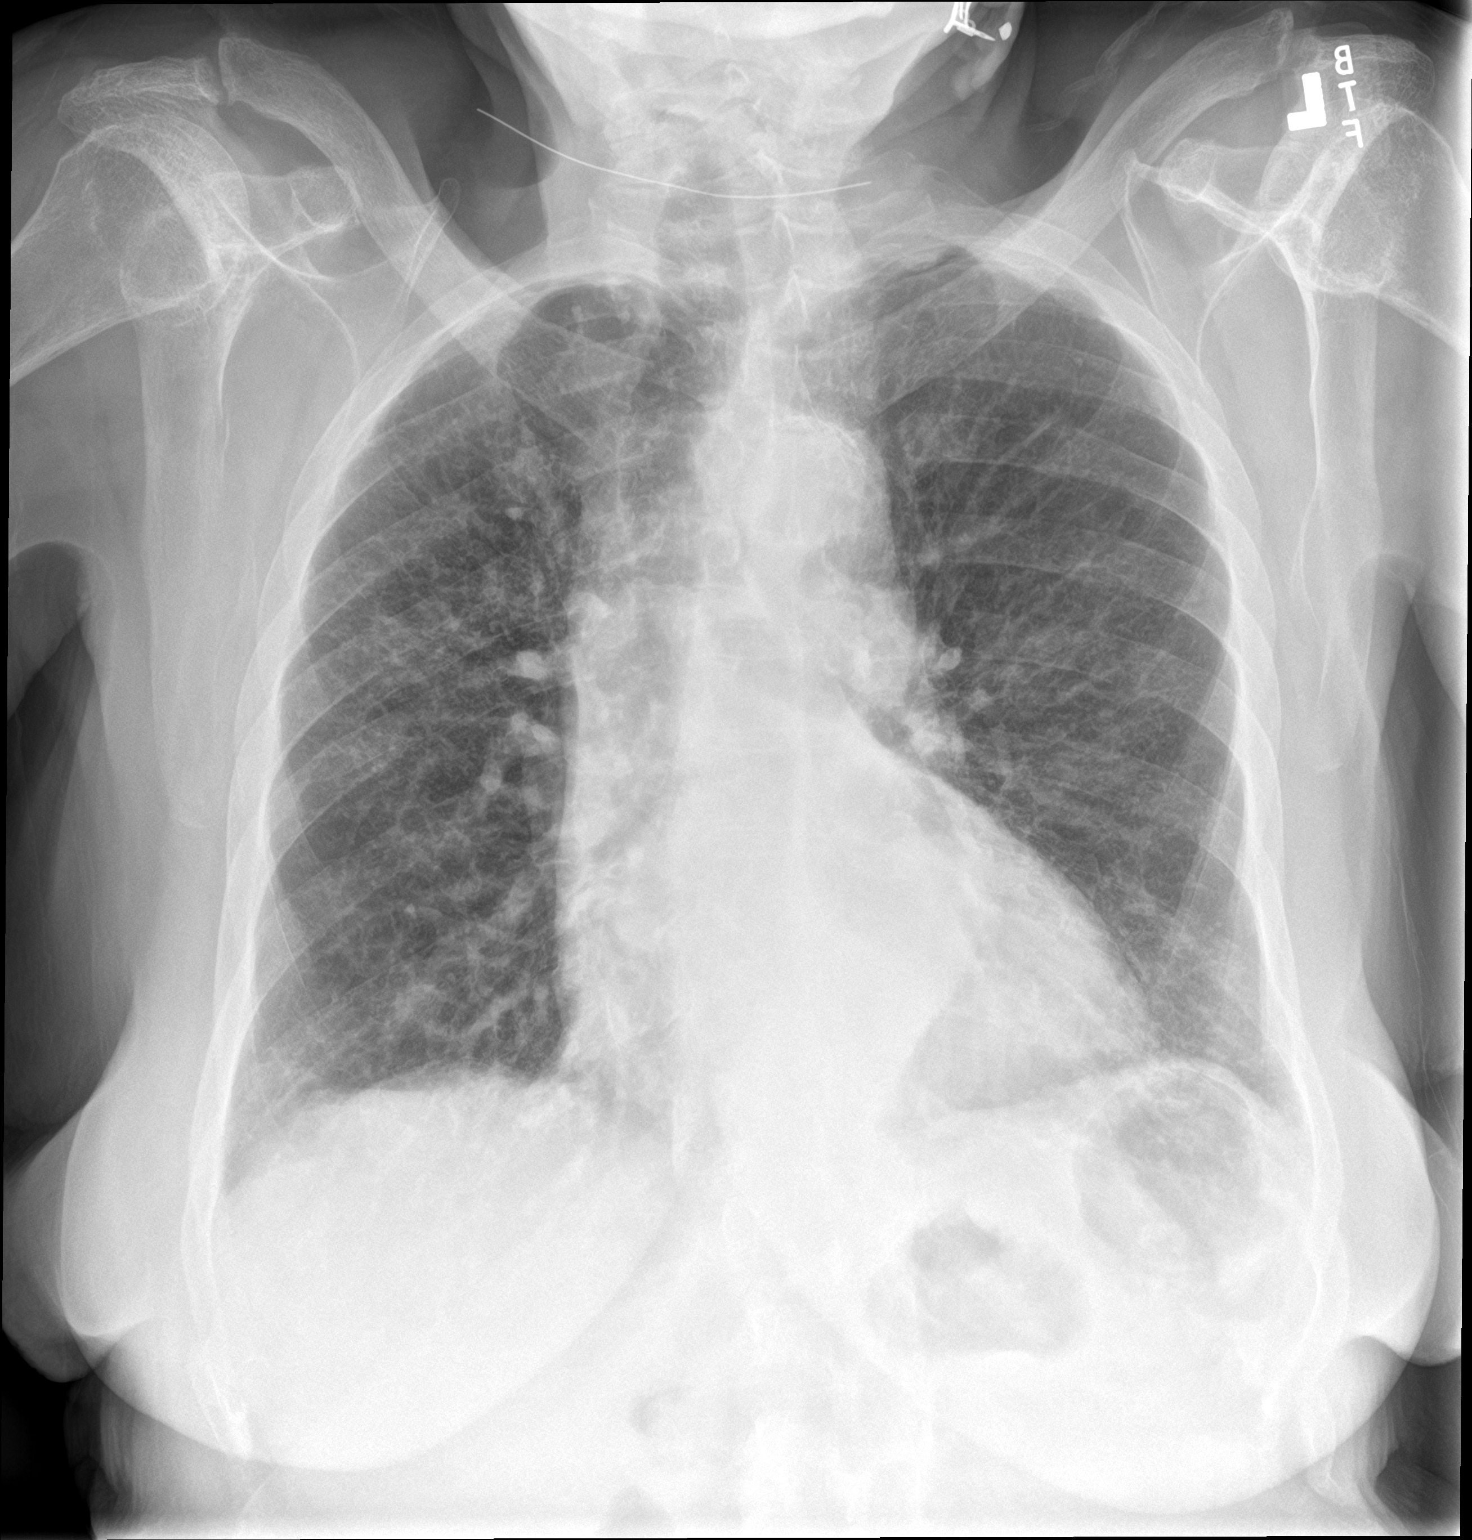

[1 of 1 positions shown; findings below may reference images not displayed]

FINDINGS: Mild cardiomegaly with mild vascular congestion. No focal
consolidation, pleural effusion, or pneumothorax. Atherosclerotic
calcification of the aorta. No acute osseous pathology.
IMPRESSION: Mild cardiomegaly with mild vascular congestion.
